# Patient Record
Sex: Female | Born: 1945 | Race: White | Hispanic: No | Smoking: Former smoker
Health system: Southern US, Community
[De-identification: ages and names within clinical notes are randomized; demographics above are authoritative.]

## PROBLEM LIST (undated history)

## (undated) DIAGNOSIS — N3281 Overactive bladder: Secondary | ICD-10-CM

## (undated) DIAGNOSIS — Z9011 Acquired absence of right breast and nipple: Secondary | ICD-10-CM

## (undated) DIAGNOSIS — I2510 Atherosclerotic heart disease of native coronary artery without angina pectoris: Secondary | ICD-10-CM

## (undated) DIAGNOSIS — M889 Osteitis deformans of unspecified bone: Secondary | ICD-10-CM

## (undated) DIAGNOSIS — I480 Paroxysmal atrial fibrillation: Secondary | ICD-10-CM

## (undated) DIAGNOSIS — E119 Type 2 diabetes mellitus without complications: Secondary | ICD-10-CM

## (undated) DIAGNOSIS — G2581 Restless legs syndrome: Secondary | ICD-10-CM

## (undated) DIAGNOSIS — E039 Hypothyroidism, unspecified: Secondary | ICD-10-CM

## (undated) DIAGNOSIS — K5790 Diverticulosis of intestine, part unspecified, without perforation or abscess without bleeding: Secondary | ICD-10-CM

## (undated) DIAGNOSIS — C801 Malignant (primary) neoplasm, unspecified: Secondary | ICD-10-CM

## (undated) HISTORY — PX: EYE SURGERY: SHX253

## (undated) HISTORY — DX: Overactive bladder: N32.81

## (undated) HISTORY — DX: Malignant (primary) neoplasm, unspecified: C80.1

## (undated) HISTORY — DX: Type 2 diabetes mellitus without complications: E11.9

## (undated) HISTORY — PX: CATARACT EXTRACTION, BILATERAL: SHX1313

## (undated) HISTORY — PX: MASTECTOMY: SHX3

## (undated) HISTORY — DX: Hypothyroidism, unspecified: E03.9

## (undated) HISTORY — PX: APPENDECTOMY: SHX54

## (undated) HISTORY — DX: Osteitis deformans of unspecified bone: M88.9

## (undated) HISTORY — PX: CATARACT EXTRACTION: SUR2

## (undated) HISTORY — DX: Restless legs syndrome: G25.81

---
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
## 1997-06-07 HISTORY — PX: ABDOMINAL HYSTERECTOMY: SHX81

## 2000-01-25 ENCOUNTER — Encounter: Payer: Self-pay | Admitting: General Surgery

## 2000-01-27 ENCOUNTER — Encounter: Payer: Self-pay | Admitting: General Surgery

## 2000-01-28 ENCOUNTER — Inpatient Hospital Stay (HOSPITAL_COMMUNITY): Admission: RE | Admit: 2000-01-28 | Discharge: 2000-01-30 | Payer: Self-pay | Admitting: General Surgery

## 2000-01-29 ENCOUNTER — Encounter: Payer: Self-pay | Admitting: Plastic Surgery

## 2000-02-22 ENCOUNTER — Encounter: Payer: Self-pay | Admitting: Plastic Surgery

## 2000-02-23 ENCOUNTER — Inpatient Hospital Stay (HOSPITAL_COMMUNITY): Admission: RE | Admit: 2000-02-23 | Discharge: 2000-02-26 | Payer: Self-pay | Admitting: Plastic Surgery

## 2000-02-24 ENCOUNTER — Encounter: Payer: Self-pay | Admitting: Plastic Surgery

## 2000-02-29 ENCOUNTER — Ambulatory Visit (HOSPITAL_COMMUNITY): Admission: RE | Admit: 2000-02-29 | Discharge: 2000-02-29 | Payer: Self-pay | Admitting: *Deleted

## 2000-02-29 ENCOUNTER — Encounter: Payer: Self-pay | Admitting: *Deleted

## 2000-03-07 ENCOUNTER — Ambulatory Visit (HOSPITAL_BASED_OUTPATIENT_CLINIC_OR_DEPARTMENT_OTHER): Admission: RE | Admit: 2000-03-07 | Discharge: 2000-03-07 | Payer: Self-pay | Admitting: General Surgery

## 2000-03-07 ENCOUNTER — Encounter: Payer: Self-pay | Admitting: General Surgery

## 2000-03-23 ENCOUNTER — Encounter: Payer: Self-pay | Admitting: *Deleted

## 2000-03-23 ENCOUNTER — Encounter: Admission: RE | Admit: 2000-03-23 | Discharge: 2000-03-23 | Payer: Self-pay | Admitting: *Deleted

## 2000-03-25 ENCOUNTER — Encounter: Payer: Self-pay | Admitting: *Deleted

## 2000-03-25 ENCOUNTER — Inpatient Hospital Stay (HOSPITAL_COMMUNITY): Admission: AD | Admit: 2000-03-25 | Discharge: 2000-03-28 | Payer: Self-pay | Admitting: *Deleted

## 2000-04-26 ENCOUNTER — Encounter: Admission: RE | Admit: 2000-04-26 | Discharge: 2000-04-26 | Payer: Self-pay | Admitting: *Deleted

## 2000-04-26 ENCOUNTER — Encounter: Payer: Self-pay | Admitting: *Deleted

## 2000-05-03 ENCOUNTER — Encounter: Admission: RE | Admit: 2000-05-03 | Discharge: 2000-05-03 | Payer: Self-pay | Admitting: Oncology

## 2000-05-03 ENCOUNTER — Encounter: Payer: Self-pay | Admitting: Oncology

## 2000-05-27 ENCOUNTER — Encounter: Payer: Self-pay | Admitting: Oncology

## 2000-05-27 ENCOUNTER — Inpatient Hospital Stay (HOSPITAL_COMMUNITY): Admission: EM | Admit: 2000-05-27 | Discharge: 2000-05-29 | Payer: Self-pay | Admitting: Emergency Medicine

## 2000-08-25 ENCOUNTER — Encounter: Admission: RE | Admit: 2000-08-25 | Discharge: 2000-11-23 | Payer: Self-pay | Admitting: *Deleted

## 2000-10-27 ENCOUNTER — Encounter: Payer: Self-pay | Admitting: *Deleted

## 2000-10-27 ENCOUNTER — Encounter: Admission: RE | Admit: 2000-10-27 | Discharge: 2000-10-27 | Payer: Self-pay | Admitting: *Deleted

## 2000-12-23 ENCOUNTER — Ambulatory Visit (HOSPITAL_BASED_OUTPATIENT_CLINIC_OR_DEPARTMENT_OTHER): Admission: RE | Admit: 2000-12-23 | Discharge: 2000-12-23 | Payer: Self-pay | Admitting: General Surgery

## 2001-02-13 ENCOUNTER — Encounter: Admission: RE | Admit: 2001-02-13 | Discharge: 2001-03-07 | Payer: Self-pay | Admitting: *Deleted

## 2001-05-23 ENCOUNTER — Encounter: Admission: RE | Admit: 2001-05-23 | Discharge: 2001-06-21 | Payer: Self-pay | Admitting: *Deleted

## 2002-06-22 ENCOUNTER — Encounter: Payer: Self-pay | Admitting: *Deleted

## 2002-06-22 ENCOUNTER — Ambulatory Visit (HOSPITAL_COMMUNITY): Admission: RE | Admit: 2002-06-22 | Discharge: 2002-06-22 | Payer: Self-pay | Admitting: *Deleted

## 2004-04-14 ENCOUNTER — Ambulatory Visit: Payer: Self-pay | Admitting: Family Medicine

## 2005-01-01 ENCOUNTER — Ambulatory Visit: Payer: Self-pay | Admitting: Family Medicine

## 2005-01-28 ENCOUNTER — Ambulatory Visit: Payer: Self-pay | Admitting: Family Medicine

## 2005-05-19 ENCOUNTER — Ambulatory Visit: Payer: Self-pay | Admitting: Family Medicine

## 2005-06-17 ENCOUNTER — Ambulatory Visit: Payer: Self-pay | Admitting: Orthopedic Surgery

## 2005-06-21 ENCOUNTER — Ambulatory Visit (HOSPITAL_COMMUNITY): Admission: RE | Admit: 2005-06-21 | Discharge: 2005-06-21 | Payer: Self-pay | Admitting: Orthopedic Surgery

## 2005-07-01 ENCOUNTER — Ambulatory Visit: Payer: Self-pay | Admitting: Orthopedic Surgery

## 2005-07-09 ENCOUNTER — Ambulatory Visit: Payer: Self-pay | Admitting: Family Medicine

## 2005-09-06 ENCOUNTER — Ambulatory Visit: Payer: Self-pay | Admitting: Family Medicine

## 2005-09-29 ENCOUNTER — Ambulatory Visit: Payer: Self-pay | Admitting: Orthopedic Surgery

## 2006-07-22 ENCOUNTER — Ambulatory Visit: Payer: Self-pay | Admitting: Family Medicine

## 2006-12-19 ENCOUNTER — Encounter (HOSPITAL_COMMUNITY): Admission: RE | Admit: 2006-12-19 | Discharge: 2007-01-18 | Payer: Self-pay | Admitting: Endocrinology

## 2008-02-09 ENCOUNTER — Encounter: Admission: RE | Admit: 2008-02-09 | Discharge: 2008-02-09 | Payer: Self-pay | Admitting: Family Medicine

## 2010-01-13 ENCOUNTER — Encounter: Admission: RE | Admit: 2010-01-13 | Discharge: 2010-01-13 | Payer: Self-pay | Admitting: Family Medicine

## 2010-10-23 NOTE — Op Note (Signed)
[place]. [facility]  Patient:    Desiree Bowers, Desiree Bowers                         MRN: [id number] Proc. Date: 01/27/00 Adm. Date:  [id number] Attending:  Glenna Fellows Tappan                           Operative Report  PREOPERATIVE DIAGNOSES:  1. Right breast cancer.  2. Acquired absence of right breast.  POSTOPERATIVE DIAGNOSES:  1. Right breast cancer.  2. Acquired absence of right breast.  OPERATION/PROCEDURE: Immediate breast reconstruction utilizing a left transverse rectus abdominis myocutaneous flap.  SURGEON: Alfredia Ferguson, M.D.  ASSISTANT: Dr. Dan Humphreys.  ANESTHESIA: General endotracheal anesthesia.  INDICATIONS FOR PROCEDURE: The patient is a 65 year old female with a recent diagnosis of right breast carcinoma.  She underwent right total mastectomy with sentinel node biopsy today and she wishes to proceed with immediate breast reconstruction.  The potential risks of surgery including partial or complete loss of the flap, asymmetry, bleeding, infection, hematoma, abdominal wall weakness, hernias or bulges, necrosis of the umbilicus due to vascular compromise, tightness of the abdomen, chronic discomfort in the abdomen, the need for secondary surgery, and overall dissatisfaction with results were discussed with the patient in the office.  In spite of these and other risks discussed with the patient she wishes to proceed with the operation.  DESCRIPTION OF PROCEDURE: Two days prior to surgery skin marks were placed outlining the dimensions of the skin paddle.  At the time of completion of her mastectomy today I was summoned to the operating room.  The mastectomy defect was first inspected and a 10 mm Blake drain was placed in the lateral axillary gutter.  A tunnel in the inferior/medial corner of the mastectomy defect was begun, dissecting on top of the upper portion of the rectus fascia in an inferior and oblique fashion, going toward the left.  I  opted to use the left rectus muscle because of a large scar overlying the right rectus muscle.  The mastectomy defect was copiously irrigated with saline irrigation and a moist sponge was placed in the defect.  A circular incision was now made around the umbilicus and the umbilicus dissected away from the TRAM skin paddle, leaving a healthy cuff of fat around the umbilical stalk to ensure vascular integrity. The upper portion of the skin paddle incision was now made and deepened through the subcutaneous tissue.  Using electrocautery an upper abdominal flap was elevated at the level of the anterior abdominal wall fascia up to the level of the costal margins bilaterally and the xiphoid in the midline.  I connected with the partially placed tunnel from the right mastectomy defect. The lower skin paddle incision was now made and deepened until reaching the anterior abdominal wall fascia.  The right corner of the skin tunnel was elevated off the anterior abdominal wall fascia until reaching approximately 1 cm to the left of midline.  The left corner of the skin paddle was elevated off the anterior abdomen wall fascia until visualizing the lateral level of the rectus perforators, which was approximately 3 cm medial to the lateral rectus border.  Two parallel incisions were made in the anterior rectus fascia beginning at the costal margin superiorly and separating the two incisions by approximately 3 cm.  The two incisions were carried inferiorly through the anterior rectus fascia.  The lateral of  the two incisions was carried inferiorly starting along the lateral connection of the skin paddle to the anterior rectus fascia.  On reaching the inferior connection the incision of the rectus fascia was curved along the inferior connection of the skin paddle connection.  The median incision was carried inferiorly starting along the medial skin paddle connection to the anterior rectus fascia until  connecting to the previously placed rectus incision inferiorly.  The rectus muscle was now dissected out of its anatomic bed, elevating the rectus fascia off the rectus muscle with the exception of the strip overlying the middle of the muscle.  The rectus muscle was then dissected off its posterior rectus fascia, completely freeing it with the exception of its superior insertion in the inferior origin and its deep inferior epigastric vessels.  The deep inferior epigastric vessels were visualized.  The rectus muscle was now divided at the arcuate line.  The deep inferior epigastric artery was clipped between hemoclips and then divided.  The two veins were also clipped and divided. Zone 4 (right lateral one-third of the skin paddle) was excised.  The left corner of the skin paddle was also removed, removing approximately 5 cm.  The skin paddle along with its connecting muscle was tunneled up to the recipient site and temporarily stapled into position.  The abdominal wound was copiously irrigated with saline irrigation and inspected for hemostasis.  Once hemostasis had been assured the anterior rectus fascia was closed using multiple interrupted figure-of-eight buried 0 Prolene sutures.  Marlex onlay graft was then placed over the rectus closure approximately 8 cm in width and approximately 16 cm in height.  This mesh was fixed in position using a running 2-0 Prolene suture along the periphery of the mesh.  An opening was made in the central portion of the mesh and the umbilicus was brought through this opening.  The abdominal wound was again irrigated with saline irrigation and two Blake drains were placed and brought out throughout separate stab incisions inferiorly.  The patient was placed in semi-Fowler position.  The abdominal wound was closed by approximating the dermis of the abdominal incision using multiple interrupted 2-0 Vicryl sutures.  Prior to complete closure a new opening for  the umbilicus was made and the umbilicus was brought through this new opening and fixed into position using interrupted 3-0 Vicryl sutures.  On completion of the closure of the abdominal wound the abdominal  skin was cleansed, dried, and Steri-Strips placed on the skin incision. Attention was directed to the skin paddle of the flap.  There was noted to be a moderate amount of venous congestion.  The flap was elevated and venous drainage was allowed to occur, and the bluish color of the flap disappeared immediately.  I opted to remove a bit more of the left and right ends of the skin paddle.  There was mixed bleeding with some dark blood and some bright red blood.  Once I trimmed adequately the flap was placed in desired position with superior breast flap overlapping the skin paddle somewhat.  The skin paddle which was beneath the superior breast flap was de-epithelialized.  The upper cut edge of the skin paddle was then fixed to the superior limits of the mastectomy dissection using multiple interrupted 3-0 Vicryl sutures.  The superior breast flap was then closed to the cut edge of the skin of the upper portion of the skin paddle using a running 3-0 subcuticular Vicryl suture. The inferior breast flap was affixed to the  inferior cut edge of the skin paddle using multiple interrupted 3-0 Vicryl sutures in the dermis.  The area was cleansed and Steri-Strips were applied.  The vascularity appeared to be better with good capillary refill.  Dressings were applied.  The patient was awakened, extubated, and transported to the recovery room in satisfactory condition.  Estimated blood loss was approximately 150 cc. DD:  01/27/00 TD:  01/28/00 Job: [id number]

## 2010-10-23 NOTE — Op Note (Signed)
[place]. [facility]  Patient:    Desiree Bowers, Desiree Bowers                         MRN: [id number] Proc. Date: 03/07/00 Adm. Date:  [id number] Attending:  Delsa Bern CC:         Lorne Skeens. Hoxworth, M.D.   Operative Report  PREOPERATIVE DIAGNOSES: 1. Complex open wound periumbilical area, secondary to fat necrosis from    previous TRAM flap. 2. Complex open wound right TRAM flap, status post right breast reconstruction    with complications of fat necrosis.  POSTOPERATIVE DIAGNOSES: 1. Complex open wound periumbilical area, secondary to fat necrosis from    previous TRAM flap. 2. Complex open wound right TRAM flap, status post right breast reconstruction    with complications of fat necrosis.  PROCEDURE: 1. Debridement of open wound right TRAM. 2. Debridement open wound periumbilical area.  SURGEON:  Alfredia Ferguson, M.D.  ANESTHESIA:  General endotracheal anesthesia  INDICATIONS FOR SURGERY:  This is a 65 year old woman who underwent right mastectomy with TRAM flap reconstruction.  Postoperative course was complicated by venous congestion and loss of her native breast flaps inferiorly and medially and loss of a portion of the medial aspect of the TRAM flap because of fat necrosis.  In addition the patient suffered significant fatty necrosis in the periumbilical area along with necrosis of the skin.  The patient has undergone previous debridement of these areas.  She returns at this time for further debridement.  The patient understands that the entire healing process will be slow especially if she begins chemotherapy next week.  In spite of that she understands the need for debridement and wishes to proceed.  DESCRIPTION OF SURGERY:  After adequate general endotracheal anesthesia had been induced the patients chest and abdomen were prepped and draped in a sterile fashion.  Attention was first directed to the right reconstructive breast.   There was only a small amount of residual fat necrosis still present. This was sharply debrided.  The remainder of the open wound which is approximately 10 cm x 4-5 cm in width has granulated nicely.  Attention was directed to the periumbilical wound.  There was still a moderate amount of fat necrosis in the area.  This was sharply debridement until reaching punctate bleeding.  As I was debriding superiorly I got into a seroma pocket which contained probably about 50-60 cc of clear seroma fluid.  The patient was also noted to have some erythema over the lateral limits of her abdominal incision.  I dissected subcutaneously through the periumbilical wound until connecting again to a fluid collection.  It did not appear to be purulent.  This pocket was copiously irrigated out with Ringers lactate.  Following sharp debridement the pocket was irrigated with 3 liters of Ringers lactate using the pulse lavage irrigator.  Hemostasis was accomplished using electrocautery.  The wound was packed loosely with saline moistened gauze.  The patient was then dressed and turned over to Dr. Johna Sheriff who is placing a Port-A-Cath. DD:  03/07/00 TD:  03/07/00 Job: [id number]

## 2010-10-23 NOTE — Discharge Summary (Signed)
[place]. [facility]  Patient:    Desiree Bowers, Desiree Bowers                         MRN: [id number] Adm. Date:  [id number] Disc. Date: [id number] Attending:  Delsa Bern CC:         Lorne Skeens. Hoxworth, M.D.                           Discharge Summary  ADMISSION DIAGNOSIS:  Right breast carcinoma.  DISCHARGE DIAGNOSIS:  Right breast carcinoma.  OPERATION PERFORMED:  Right total mastectomy with sentinel node biopsy and immediate reconstruction utilizing left transverse rectus abdominis myocutaneous flap, all performed on January 27, 2000.  HISTORY OF PRESENT ILLNESS:  This is a 65 year old woman who saw her gynecologist in March of this year, noting some ulcerative changes of her right nipple.  The gynecologist recommended that she see a surgeon in evaluation.  However, the patient failed to follow up for several months.  She underwent a routine mammogram on July 3, which revealed suspicious retroareolar area.  The patient underwent a core needle biopsy at [facility] which returned with evidence of malignancy.  The patient was referred to Dr. Johna Sheriff for a second opinion.  The patient was counseled based on her biopsy report that mastectomy would be optimal treatment.  The patient wishes to undergo total mastectomy, sentinel node biopsy, and immediate reconstruction with TRAM flap.  PAST MEDICAL HISTORY:  Hypertension, gastroesophageal reflux, and obesity. The patient denies history of diabetes.  PAST SURGICAL HISTORY:  Abdominal history through a low transverse incision and an appendectomy.  She had four vaginal deliveries.  FAMILY HISTORY:  Mother had breast cancer.  Mother died from metastatic breast cancer.  There is also a family history of diabetes.  SOCIAL HISTORY:  The patient is married with four children that are grown. She denies alcohol or tobacco use.  She is presently unemployed.  REVIEW OF SYSTEMS:  The patient denies any  history of epilepsy, headaches, or dizziness.  She denies shortness of breath, dyspnea on exertion, or angina. There is no history of heart attacks.  She does admit to gastroesophageal reflux.  She denies blood in her urine, blood in her stool, or chronic constipation or diarrhea.  The patient has no history of hepatitis.  MEDICATIONS:  Include Synthroid, Prilosec, Premarin, hydrochlorothiazide, alprazolam, and Darvocet.  PHYSICAL EXAMINATION:  Please see admission H&P for complete physical exam.  ADMISSION LABORATORY VALUES:  Included an urinalysis which was negative.  The patient also had a CBC which showed a hemoglobin of 12.7, hematocrit 37.6, and a white count of 8100.  Her electrolytes were completely normal.  Chest x-ray was normal and cardiogram was normal.  HOSPITAL COURSE:  On the day of admission, the patient underwent a right total mastectomy and a sentinel node biopsy.  Sentinel node biopsy was negative.  Following mastectomy, the patient underwent an immediate reconstruction using a left contralateral transverse rectus abdominis myocutaneous flap.  The surgery went well.  Postoperatively, the patients course was complicated by elevated temperatures in the 102 plus range for the first 24 hours.  She was treated with vigorous pulmonary breathing exercises.  She had a chest x-ray on the second postoperative day which showed either perihilar atelectasis or pneumonia.  My feeling is that it likely represents atelectasis.  She began to defervesce on the second postoperative day and has remained essentially afebrile  most of the time with the exception of the elevation of temperatures in the high 100s on one occasion.  The patients TRAM flap had some blue discoloration in the medial one-third. It hopefully represents epidermal lysis and not underlying necrosis of the fatty tissue.  Nothing needs to be done at this point and this will simply be observed as an outpatient.  The  abdominal wound seems to be healing fairly well.  She again had some blue discoloration in the periumbilical area.  The rest of the wound of the abdomen looks good.  Her drainage has been fairly significant in the greater than 100 cc per drain per 24 hours.  Her drains were occluded the first 24 hours, and this may speak to why they have been so high the second and third day.  The patient did desire to be discharged from the hospital on the third postoperative day.  She is tolerating a regular diet, ambulating without difficulty, and her pain is under control with oral medication.  I will follow her up four days after discharge.  DISCHARGE INSTRUCTIONS:  Include to do no heavy lifting, no driving, or no strenuous activity.  She may wear her bra for several hours a day beginning today.  DISCHARGE MEDICATIONS:  Include Tylox one to two q.4h. p.r.n. pain and Keflex 500 mg q.i.d. for five days.  DISPOSITION:  She has been counseled regarding emptying her Blake drains twice a day and recording the output.  She understands postoperative care.  She wishes to proceed with discharge. DD:  01/30/00 TD:  02/01/00 Job: [id number]

## 2010-10-23 NOTE — Op Note (Signed)
[place]. [facility]  Patient:    Desiree Bowers, Desiree Bowers                         MRN: [id number] Proc. Date: 03/07/00 Adm. 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  POSTOPERATIVE DIAGNOSIS:  Carcinoma of the breast with poor venous access.  OPERATION PERFORMED:  Placement of Life-Port via the left internal jugular vein.  SURGEON:  Lorne Skeens. Hoxworth, M.D.  ANESTHESIA:  General.  INDICATIONS FOR PROCEDURE:  [name] is a 65 year old white female with a recent diagnosis of breast cancer.  She has poor venous access and will require long term access for IV chemotherapy.  Placement of a subcutaneous venous port has been recommended and accepted.  The nature of the procedure, its indications and risks of bleeding, infection, thrombosis, pneumothorax were discussed and understood preoperatively.  She is now brought to the operating room for this procedure.  DESCRIPTION OF PROCEDURE:  Following completion of Dr. Audelia Hives procedure under general anesthesia, the left neck, chest and shoulder were sterilely prepped with Betadine and draped.  She had received broad spectrum antibiotics preoperatively.  Initially, I tried to cannulate the left subclavian vein but could not cannulate through this vessel.  Following this, the left internal jugular vein was cannulated with a needle and guide wire and confirmed by fluoroscopy.  Following this, the introducer was passed over the guide wire and the flushed catheter placed via the introducer which was then stripped away and the tip of the catheter positioned in the superior vena cava by fluoroscopy.  Following this, the site on the anterior chest wall was chosen. A transverse incision was made and a subcutaneous pocket created.  The catheter was tunneled subcutaneously into the pocket, trimmed  to length and attached to the port which was sutured to the anterior chest wall with interrupted 2-0 Prolene.  The incisions were then closed with interrupted subcutaneous 4-0 Monocryl, running subcuticular 4-0 Monocryl and Steri-Strips. The soft tissue had been infiltrated with Marcaine.  The catheter flushed and aspirated easily and was left flushed with concentrated heparin solution. Sponge, needle and instrument counts were correct.  Dry sterile dressings were applied.  The patient was transferred to the recovery room in good condition. DD:  03/07/00 TD:  03/08/00 Job: [id number]

## 2010-10-23 NOTE — Op Note (Signed)
[place]. [facility]  Patient:    Desiree Bowers, Desiree Bowers                         MRN: [id number] Proc. Date: 01/27/00 Adm. Date:  [id number] Attending:  Glenna Fellows Tappan                           Operative Report  PREOPERATIVE DIAGNOSIS:  Carcinoma of the right breast.  POSTOPERATIVE DIAGNOSIS:  Carcinoma of the right breast.  OPERATION PERFORMED: 1. Blue dye injection. 2. Right axillary sentinel lymph node biopsy. 3. Right total mastectomy.  SURGEON:  Lorne Skeens. Hoxworth, M.D.  ANESTHESIA:  General.  INDICATIONS FOR PROCEDURE:  Desiree Bowers is a 65 year old white female who presents with crusting and bleeding from the right nipple.  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  After discussion of treatment options, she has elected to proceed with a right axillary sentinel lymph node biopsy, possible right axillary dissection and right mastectomy to be followed by immediate TRAM flap reconstruction.  The nature of the procedure, its indications and risks of bleeding and infection were discussed and understood preoperatively.  DESCRIPTION OF PROCEDURE:  The patient was brought to the operating room and placed in supine position on the operating table and general endotracheal anesthesia was induced.  The entire chest and abdomen were sterilely prepped and draped.  PAS were placed.  She received broad spectrum antibiotics preoperatively.  She had undergone periareolar injection of technetium sulfur colloid in nuclear medicine one hour previously.  10 cc of Lymphazurin blue was injected in the periareolar subcutaneous and intradermal space and massaged for five minutes.  The Neoprobe was used to localize a definite hot spot in the right axilla.  A small incision was made over this and dissection carried down through the  subcutaneous tissues with cautery.  The axilla was bluntly entered.  A blue lymphatic was seen coursing down to the axilla, was traced down to a bright blue lymph node that was very hot.  This was excised with cautery and clips.  Ex vivo the node had counts of approximately 2900. There was background in the axilla of about 10 to 15 and one further hot area of about 500.  I carried dissection down onto this hot area and there were two or three smaller nodes that did not contain any dye that were mildly hot compared to the original sentinel node.  These were also similarly excised and the first was sent for Touch Prep and two others sent for permanent section. Following removal of these there were no counts in the axilla higher than 10 to 15.  The sentinel node Touch Preps returned with negative for cancer.  An elliptical transverse incision was made encompassing the nipple areolar complex of the right breast.  Skin and subcutaneous flaps were then raised to the periphery of the breast superiorly, medially, inferiorly and laterally out to the latissimus dorsi.  The breast was then reflected up off the pectoralis fascia and the cautery.  The breast was freed from the serratus and latissimus laterall.  The breast was dissected free down to tail of Spence and junction of the axilla at which point, axillary contents were divided across Benbow clamps and tied with 3-0 Vicryl and the specimen removed.  Complete  hemostasis was assured.  Dr. Benna Dunks then proceeded with TRAM flap reconstruction. DD:  01/27/00 TD:  01/28/00 Job: [id number]

## 2010-10-23 NOTE — Op Note (Signed)
[place]. [facility]  Patient:    Desiree Bowers, Desiree Bowers                         MRN: [id number] Proc. Date: 02/22/00 Adm. Date:  [id number] Attending:  Loura Halt Ii                           Operative Report  PREOPERATIVE DIAGNOSIS:  1. Necrosis of medial 20% of right transverse rectus abdominis myocutaneous    flap. 2. Necrosis of medial and inferior breast flaps on mastectomy side, right. 3. Necrosis of periumbilical skin.  POSTOPERATIVE DIAGNOSIS: 1. Necrosis of medial 20% of right transverse rectus abdominis myocutaneous    flap. 2. Necrosis of medial and inferior breast flaps on mastectomy side, right. 3. Necrosis of periumbilical skin.  OPERATION PERFORMED: 1. Debridement of necrotic periumbilical skin and placement of saline    wet-to-dry dressings. 2. Debridement of medial transverse rectus abdominis myocutaneous flap and    breast flap skin and placement of VAC sponge.  SURGEON:  Alfredia Ferguson, M.D.  ANESTHESIA:  General endotracheal.  INDICATIONS FOR PROCEDURE:  The patient is a 65 year old woman who is approximately a month after right-sided TRAM flap.  She had venous congestion of the flap immediately postoperatively.  It seemed to clear and then about a week after surgery, she began to develop what appeared to be epidermolysis of the skin.  I have allowed the wound to demarcate.  She now has a very dark, hard eschar over the medial aspect of her right-sided TRAM flap.  In addition, she developed necrosis of the medial and inferior breast flaps from the mastectomy.  Furthermore, the patient had an approximately 5 x 5 cm skin necrosis in the periumbilical region.  Interestingly, the umbilicus is perfectly fine.  Plan is to debride these areas.  DESCRIPTION OF PROCEDURE:  After adequate general endotracheal anesthesia had been induced, the patients chest and abdomen was draped in sterile fashion. Using sharp dissection, the  periumbilical skin was debrided down to the fatty tissue.  The wound was irrigated.  There was minimal bleeding coming from fat so it likely the patient has significant fatty necrosis.  I made no attempt to debride the fatty necrosis in order to allow the area to demarcate further. Attention was now directed to the right-sided TRAM flap. Using tangential excision of the eschar, the entire eschar was removed.  There was some fatty tissue that was viable.  There was also some that was questionable.  The questionable fatty tissue was left behind to allow it to dermarcate.  After removal of the entire eschar, the wound was copiously irrigated with saline irrigation.  I opted to place a VAC sponge.  The VAC was placed without difficulty.  The VAC was turned on a suction of 125 torr.  The patient tolerated the procedure well with minimal blood loss.  She was awakened extubated and transported to the recovery room in satisfactory condition. DD:  02/22/00 TD:  02/23/00 Job: [id number]

## 2010-10-23 NOTE — Discharge Summary (Signed)
[place]. [facility]  Patient:    Desiree Bowers, Desiree Bowers                         MRN: [id number] Adm. Date:  [id number] Disc. Date: [id number] Attending:  Loura Halt Ii                           Discharge Summary  ADMISSION DIAGNOSES: 1. Partial necrosis of transverse rectus abdominis myocutaneous flap right    chest. 2. Partial necrosis of periumbilical skin and subcutaneous tissue related to    previous transverse rectus abdominis myocutaneous flap. 3. History of breast cancer. 4. Hypertension. 5. Hypothyroidism. 6. Gastroesophageal reflux.  DISCHARGE DIAGNOSES: 1. Partial necrosis of transverse rectus abdominis myocutaneous flap right    chest. 2. Partial necrosis of periumbilical skin and subcutaneous tissue related to    previous transverse rectus abdominis myocutaneous flap. 3. History of breast cancer. 4. Hypertension. 5. Hypothyroidism. 6. Gastroesophageal reflux.  OPERATIONS:  Debridement of right transverse rectus abdominis myocutaneous flap and periumbilical tissue performed on February 22, 2000.  HISTORY OF PRESENT ILLNESS:  This is a 65 year old woman who is status post right mastectomy for breast cancer reconstructed with a TRAM flap. Postoperatively, the patient experienced necrosis of the middle one-fourth to one-third of the TRAM flap.  She also experienced necrosis of her native breast flaps medially and inferomedially.  The patient also experienced necrosis of the periumbilical skin and subcutaneous tissue.  She has been managed as an outpatient up to this point, but was admitted on February 22, 2000, for debridement.  PAST MEDICAL HISTORY: 1. Mild anemia, probably secondary to chronic disease. 2. Mild hypothyroidism. 3. Hypertension.  PAST SURGICAL HISTORY:  Right mastectomy for breast cancer.  MEDICATIONS: 1. Synthroid 0.05 mg daily. 2. Protonix 40 mg daily. 3. Avapro 300 mg daily. 4. Hydrochlorothiazide 12.5 mg  daily. 5. Vicodin 1-2 every four hours as needed for pain. 6. Restoril 30 mg at night as needed for sleep.  ADMISSION LABORATORY DATA:  CBC, which showed a hemoglobin of 11.0, hematocrit 33.2.  She had an elevated glucose of 117.  Her SGOT and SGPT are slightly elevated at 43 and 45 respectively.  Potassium slightly elevated at 5.3.  All the rest of the electrolytes were normal.  HOSPITAL COURSE:  On the day of admission the patient was taken to the operating room, where she underwent debridement of the medial aspect of her TRAM flap and also debridement of her native breast flaps medially and inferiorly.  The patient also underwent debridement of the periumbilical skin and fat.  I placed a Vac on the breast wound, and we did saline wet-to-dry dressings.  The patients insurance company denied obtainment of the Vac as an outpatient.  This is unfortunate, as this would certainly have sped her recovery and healing.  I am discharging her at this time for instructions for twice-daily dressing changes using saline wet-to-dry in both abdominal and breast woundd.  I anticipate that this woun will require further debridement of the fat necrosis which is present.  I did do some bedside debridement on the day she was being discharged.  The right breast wound looks much better. I do see some beginning granulation tissue.  The abdominal wound shows no evidence of granulation tissue and does have some residual fat necrosis which will require debridement.  DISCHARGE MEDICATIONS:  Include all medications the patient was taking when she  came in the hospital.  FOLLOW-UP:  Will be provided in six days in my office.  The patient understands the need for further debridement.  She wishes to go home at this time. DD:  02/26/00 TD:  02/28/00 Job: [id number]

## 2010-10-23 NOTE — Discharge Summary (Signed)
[place]. [facility]  Patient:    Desiree Bowers, Desiree Bowers                         MRN: [id number] Adm. Date:  [id number] Disc. Date: [id number] Attending:  Aliene Altes                           Discharge Summary  REASON FOR ADMISSION:  The patient was admitted on October 19 for an episode of febrile neutropenia.  HOSPITAL COURSE:  Cultures remained negative. She was recovered nicely with Neupogen and Primaxin IV. She had no specific complaints. She was transfused two units of blood on October 22 and discharged home thereafter in good condition on p.o. Levaquin.  FOLLOWUP:  The patient will followup with me in the office later this week.  DISCHARGE MEDICATIONS: 1. Vicodin. 2. Levaquin. 3. She will come into the office on Friday for her weekly Epogen on Friday. DD:  03/28/00 TD:  03/29/00 Job: [id number]

## 2010-10-23 NOTE — H&P (Signed)
. [facility]  Patient:    Desiree Bowers, Desiree Bowers                         MRN: [id number] Adm. Date:  [id number] Attending:  Aliene Altes                         History and Physical  CHIEF COMPLAINT: This patient is a 65 year old female with stage 2 breast cancer, three positive nodes, ER/PR negative, Her-2 3+, status post first cycle of Adriamycin and Cytoxan on March 14, 2000, now with presentation of febrile neutropenia.  HISTORY OF PRESENT ILLNESS: It should be noted that she had blood drawn on March 21, 2000 and her ANC at that point was 800, and nobody called an M.D. No specific complaints today.  No cough.  No urinary symptoms.  No catheter site changes.  PAST MEDICAL HISTORY:  1. Padgetts bone disease.  2. Hypothyroidism.  3. Hypertension.  SOCIAL HISTORY: No tobacco.  FAMILY HISTORY: Positive for breast, ovarian, and colon cancer.  MEDICATIONS:  1. Atacand.  2. Synthroid.  ALLERGIES: PENICILLIN.  PHYSICAL EXAMINATION:  VITAL SIGNS: Temperature 101.5 degrees, blood pressure 129/74, heart rate 131, respirations 20.  HEENT: Port-A-Cath looks good.  Oropharynx clear.  CHEST: Clear.  HEART: Regular rate and rhythm.  ABDOMEN: Positive bowel sounds, soft.  EXTREMITIES: Good range of motion.  No edema.  NEUROLOGIC: Alert and oriented x 3.  Cranial nerves 2-12 grossly intact. Motor and sensation intact globally.  IMPRESSION: Stage 2 breast cancer, status post Adriamycin and Cytoxan x 1, with febrile neutropenia.  PLAN: We are going to draw cultures and get a chest x-ray, start Primaxin and IV fluids, and Neupogen. DD:  03/25/00 TD:  03/26/00 Job: [id number]

## 2010-10-23 NOTE — H&P (Signed)
[place]. [facility]  Patient:    Desiree Bowers, Desiree Bowers                       MRN: [id number] Adm. Date:  [id number] Attending:  Aliene Altes CC:         Aliene Altes, M.D.   History and Physical  CHIEF COMPLAINT:  Desiree Bowers is a 65 year old with a history of stage II right-sided breast cancer.  She is now admitted with a fever in the setting of neutropenia, following AC chemotherapy.  HISTORY OF PRESENT ILLNESS:  Desiree Bowers has a history of stage II right-sided breast cancer.  She was diagnosed with node-positive disease after undergoing a right-sided mastectomy with an immediate TRAM reconstruction earlier this year.  She is followed by Dr. Aliene Altes, and she has been treated with adjuvant [facility] chemotherapy.  She was admitted with febrile neutropenia following cycle number one of AC.  She completed a fourth cycle of AC chemotherapy on May 17, 2000.  She says that she was well until last p.m. when she developed a fever of 101.3 degrees.  She had no associated symptoms.  She presented to the [facility] Emergency Room for further evaluation, and she was noted to have a temperature of 102.2 degrees.  The initial white count returned at 1.2, with approximately 0.25 neutrophils, and she is now admitted for further evaluation and intravenous antibiotic support.   PAST MEDICAL HISTORY: 1. History of breast cancer as above. 2. History of Pagets disease of the bone. 3. Hypothyroidism. 4. Hypertension.  CURRENT MEDICATIONS: 1. Synthroid 0.5 mg q.d. 2. Compazine p.r.n. 3. Prilosec 20 mg q.d. 4. Coumadin 1 mg q.d.  ALLERGIES:  PENICILLIN.  FAMILY HISTORY:  Not obtained.  SOCIAL HISTORY:  Noncontributory.  REVIEW OF SYSTEMS:  She denies diarrhea, nausea/vomiting, dysuria, cough, pain, and she has not noted any change in the abdominal or right chest TRAM surgical sites.  She is receiving daily wet to dry dressing changes via a home care nurse,  to the slowly healing surgical wounds from the TRAM surgery.  PHYSICAL EXAMINATION:  VITAL SIGNS:  On admission to the emergency room temperature 102.2 degrees, pulse 129, blood pressure 148/84, respirations 18, oxygen saturation 98% on room air.  HEENT:  Sclerae anicteric.  Oropharynx without thrush or ulcerations.  NECK:  Without palpable mass.  LUNGS:  Clear bilaterally.  No distress.  CARDIAC:  A regular rate and rhythm, no gallop.  CHEST:  Left upper chest Port-A-Cath site without erythema or tenderness. Status post right TRAM reconstruction.  There is an area of granulation tissue over the lower inner portion of the TRAM tissue.  There is no surrounding erythema.  No drainage.  ABDOMEN:  Soft, nontender.  No organomegaly, no mass.  Healing TRAM surgical site with a wet to dry gauze dressing in place.  I cannot express any drainage.  There is no surrounding erythema.  NODES:  No palpable lymph nodes.  EXTREMITIES:  No edema.  SKIN:  No rash.  LABORATORY DATA:  Hemoglobin 9.4, white count 1.2, platelets 234,000. Prelim8inary neutrophils 20%.  IMPRESSION/RECOMMENDATIONS:  Desiree Bowers is a 65 year old with a history of stage II right-sided breast cancer.  She is now 10 days out from a fourth course of [facility] chemotherapy, and she is admitted with a fever, in the setting of neutropenia.  There is no apparent source for infection on a review of her history and physical examination this morning.  We will obtain a culture from the Port-A-Cath site, and a culture of the urine.  She will be started on broad-spectrum intravenous antibiotic support. I will continue her on Neupogen.  (She received Neupogen for five days following this cycle of chemotherapy.)  We will continue wet to dry dressing changes at the TRAM sites.  These areas do not appear infected.  She appears clinically stable, and I am hopeful that we will be able to discharge her over the next few days. DD:   05/27/00 TD:  05/27/00 Job: [id number]

## 2011-03-19 ENCOUNTER — Other Ambulatory Visit: Payer: Self-pay | Admitting: Family Medicine

## 2011-03-19 DIAGNOSIS — Z853 Personal history of malignant neoplasm of breast: Secondary | ICD-10-CM

## 2011-03-19 DIAGNOSIS — N644 Mastodynia: Secondary | ICD-10-CM

## 2011-04-05 ENCOUNTER — Ambulatory Visit
Admission: RE | Admit: 2011-04-05 | Discharge: 2011-04-05 | Disposition: A | Discharge status: Home / Self Care | Payer: Medicare Other | Source: Ambulatory Visit | Attending: Family Medicine | Admitting: Family Medicine

## 2011-04-05 DIAGNOSIS — N644 Mastodynia: Secondary | ICD-10-CM

## 2011-04-05 DIAGNOSIS — Z853 Personal history of malignant neoplasm of breast: Secondary | ICD-10-CM

## 2011-11-16 ENCOUNTER — Other Ambulatory Visit: Payer: Self-pay | Admitting: *Deleted

## 2011-11-16 NOTE — Telephone Encounter (Signed)
Call requesting mastectomy bra and breast prosthesis. Patient has not been seen in this practice since 2005. Made pharmacy aware we will not be able to meet this request. Best to follow up with her PCP.

## 2012-06-09 ENCOUNTER — Other Ambulatory Visit: Payer: Self-pay | Admitting: Family Medicine

## 2012-06-09 DIAGNOSIS — Z1231 Encounter for screening mammogram for malignant neoplasm of breast: Secondary | ICD-10-CM

## 2012-06-09 DIAGNOSIS — Z9011 Acquired absence of right breast and nipple: Secondary | ICD-10-CM

## 2012-06-23 ENCOUNTER — Ambulatory Visit
Admission: RE | Admit: 2012-06-23 | Discharge: 2012-06-23 | Disposition: A | Discharge status: Home / Self Care | Payer: Medicare Other | Source: Ambulatory Visit | Attending: Family Medicine | Admitting: Family Medicine

## 2012-06-23 DIAGNOSIS — Z9011 Acquired absence of right breast and nipple: Secondary | ICD-10-CM

## 2012-06-23 DIAGNOSIS — Z1231 Encounter for screening mammogram for malignant neoplasm of breast: Secondary | ICD-10-CM

## 2012-12-01 ENCOUNTER — Other Ambulatory Visit: Payer: Self-pay | Admitting: Gastroenterology

## 2012-12-01 DIAGNOSIS — Z1211 Encounter for screening for malignant neoplasm of colon: Secondary | ICD-10-CM

## 2012-12-01 DIAGNOSIS — K562 Volvulus: Secondary | ICD-10-CM

## 2012-12-18 ENCOUNTER — Other Ambulatory Visit: Payer: Medicare Other

## 2013-01-08 ENCOUNTER — Ambulatory Visit
Admission: RE | Admit: 2013-01-08 | Discharge: 2013-01-08 | Disposition: A | Discharge status: Home / Self Care | Payer: Medicare Other | Source: Ambulatory Visit | Attending: Gastroenterology | Admitting: Gastroenterology

## 2013-01-08 DIAGNOSIS — K562 Volvulus: Secondary | ICD-10-CM

## 2013-02-13 ENCOUNTER — Ambulatory Visit: Payer: Medicare Other | Attending: Internal Medicine | Admitting: Physical Therapy

## 2013-02-13 DIAGNOSIS — R42 Dizziness and giddiness: Secondary | ICD-10-CM | POA: Insufficient documentation

## 2013-02-13 DIAGNOSIS — IMO0001 Reserved for inherently not codable concepts without codable children: Secondary | ICD-10-CM | POA: Insufficient documentation

## 2013-02-13 DIAGNOSIS — Z9181 History of falling: Secondary | ICD-10-CM | POA: Insufficient documentation

## 2013-02-21 ENCOUNTER — Ambulatory Visit: Payer: Medicare Other | Admitting: Physical Therapy

## 2013-02-28 ENCOUNTER — Ambulatory Visit: Payer: Medicare Other | Admitting: Physical Therapy

## 2013-03-06 ENCOUNTER — Ambulatory Visit: Payer: Medicare Other

## 2013-09-20 ENCOUNTER — Other Ambulatory Visit (HOSPITAL_COMMUNITY): Payer: Self-pay | Admitting: Family Medicine

## 2013-09-20 DIAGNOSIS — Z1231 Encounter for screening mammogram for malignant neoplasm of breast: Secondary | ICD-10-CM

## 2013-09-24 DIAGNOSIS — I10 Essential (primary) hypertension: Secondary | ICD-10-CM | POA: Insufficient documentation

## 2013-09-25 ENCOUNTER — Ambulatory Visit (HOSPITAL_COMMUNITY): Payer: Medicare Other

## 2013-09-26 ENCOUNTER — Other Ambulatory Visit (HOSPITAL_COMMUNITY): Payer: Self-pay | Admitting: Family Medicine

## 2013-09-26 ENCOUNTER — Ambulatory Visit
Admission: RE | Admit: 2013-09-26 | Discharge: 2013-09-26 | Disposition: A | Discharge status: Home / Self Care | Payer: Medicare Other | Source: Ambulatory Visit | Attending: Family Medicine | Admitting: Family Medicine

## 2013-09-26 DIAGNOSIS — Z1231 Encounter for screening mammogram for malignant neoplasm of breast: Secondary | ICD-10-CM

## 2013-12-18 DIAGNOSIS — M889 Osteitis deformans of unspecified bone: Secondary | ICD-10-CM | POA: Insufficient documentation

## 2014-01-18 DIAGNOSIS — R768 Other specified abnormal immunological findings in serum: Secondary | ICD-10-CM | POA: Insufficient documentation

## 2014-01-28 ENCOUNTER — Other Ambulatory Visit: Payer: Self-pay | Admitting: Internal Medicine

## 2014-01-28 DIAGNOSIS — E041 Nontoxic single thyroid nodule: Secondary | ICD-10-CM

## 2014-01-28 DIAGNOSIS — E063 Autoimmune thyroiditis: Secondary | ICD-10-CM

## 2014-01-30 ENCOUNTER — Ambulatory Visit
Admission: RE | Admit: 2014-01-30 | Discharge: 2014-01-30 | Disposition: A | Discharge status: Home / Self Care | Payer: Medicare Other | Source: Ambulatory Visit | Attending: Internal Medicine | Admitting: Internal Medicine

## 2014-01-30 DIAGNOSIS — E063 Autoimmune thyroiditis: Secondary | ICD-10-CM

## 2014-01-30 DIAGNOSIS — E041 Nontoxic single thyroid nodule: Secondary | ICD-10-CM

## 2014-04-04 DIAGNOSIS — K219 Gastro-esophageal reflux disease without esophagitis: Secondary | ICD-10-CM | POA: Insufficient documentation

## 2014-04-04 DIAGNOSIS — F411 Generalized anxiety disorder: Secondary | ICD-10-CM | POA: Insufficient documentation

## 2014-06-19 DIAGNOSIS — I480 Paroxysmal atrial fibrillation: Secondary | ICD-10-CM

## 2014-06-19 DIAGNOSIS — I209 Angina pectoris, unspecified: Secondary | ICD-10-CM | POA: Insufficient documentation

## 2014-06-19 HISTORY — DX: Paroxysmal atrial fibrillation: I48.0

## 2014-06-20 ENCOUNTER — Other Ambulatory Visit: Payer: Self-pay

## 2014-06-20 DIAGNOSIS — I83813 Varicose veins of bilateral lower extremities with pain: Secondary | ICD-10-CM

## 2014-08-07 ENCOUNTER — Encounter: Payer: Medicare Other | Admitting: Vascular Surgery

## 2014-08-07 ENCOUNTER — Encounter (HOSPITAL_COMMUNITY): Payer: Medicare Other

## 2014-11-15 DIAGNOSIS — E039 Hypothyroidism, unspecified: Secondary | ICD-10-CM | POA: Insufficient documentation

## 2014-11-15 DIAGNOSIS — M150 Primary generalized (osteo)arthritis: Secondary | ICD-10-CM

## 2014-11-15 DIAGNOSIS — Z9011 Acquired absence of right breast and nipple: Secondary | ICD-10-CM

## 2014-11-15 DIAGNOSIS — M159 Polyosteoarthritis, unspecified: Secondary | ICD-10-CM | POA: Insufficient documentation

## 2014-11-15 HISTORY — DX: Acquired absence of right breast and nipple: Z90.11

## 2014-11-18 ENCOUNTER — Other Ambulatory Visit: Payer: Self-pay

## 2014-11-18 DIAGNOSIS — Z1231 Encounter for screening mammogram for malignant neoplasm of breast: Secondary | ICD-10-CM

## 2014-11-19 ENCOUNTER — Ambulatory Visit
Admission: RE | Admit: 2014-11-19 | Discharge: 2014-11-19 | Disposition: A | Discharge status: Home / Self Care | Payer: Medicare Other | Source: Ambulatory Visit

## 2014-11-19 ENCOUNTER — Other Ambulatory Visit: Payer: Self-pay | Admitting: Family Medicine

## 2014-11-19 DIAGNOSIS — Z1231 Encounter for screening mammogram for malignant neoplasm of breast: Secondary | ICD-10-CM

## 2014-11-19 DIAGNOSIS — N644 Mastodynia: Secondary | ICD-10-CM

## 2014-11-21 ENCOUNTER — Other Ambulatory Visit: Payer: Self-pay | Admitting: Family Medicine

## 2014-11-21 DIAGNOSIS — R928 Other abnormal and inconclusive findings on diagnostic imaging of breast: Secondary | ICD-10-CM

## 2014-11-22 ENCOUNTER — Ambulatory Visit
Admission: RE | Admit: 2014-11-22 | Discharge: 2014-11-22 | Disposition: A | Discharge status: Home / Self Care | Payer: Medicare Other | Source: Ambulatory Visit | Attending: Family Medicine | Admitting: Family Medicine

## 2014-11-22 DIAGNOSIS — N644 Mastodynia: Secondary | ICD-10-CM

## 2014-11-22 DIAGNOSIS — R928 Other abnormal and inconclusive findings on diagnostic imaging of breast: Secondary | ICD-10-CM

## 2015-04-16 ENCOUNTER — Other Ambulatory Visit: Payer: Self-pay | Admitting: Family Medicine

## 2015-04-16 DIAGNOSIS — N6320 Unspecified lump in the left breast, unspecified quadrant: Secondary | ICD-10-CM

## 2015-05-15 ENCOUNTER — Ambulatory Visit
Admission: RE | Admit: 2015-05-15 | Discharge: 2015-05-15 | Disposition: A | Discharge status: Home / Self Care | Payer: Medicare Other | Source: Ambulatory Visit | Attending: Family Medicine | Admitting: Family Medicine

## 2015-05-15 ENCOUNTER — Other Ambulatory Visit: Payer: Medicare Other

## 2015-05-15 DIAGNOSIS — N6320 Unspecified lump in the left breast, unspecified quadrant: Secondary | ICD-10-CM

## 2016-02-12 ENCOUNTER — Ambulatory Visit (INDEPENDENT_AMBULATORY_CARE_PROVIDER_SITE_OTHER): Payer: Medicare Other | Admitting: Otolaryngology

## 2016-02-12 DIAGNOSIS — H903 Sensorineural hearing loss, bilateral: Secondary | ICD-10-CM

## 2016-02-12 DIAGNOSIS — H8111 Benign paroxysmal vertigo, right ear: Secondary | ICD-10-CM

## 2016-02-12 DIAGNOSIS — R42 Dizziness and giddiness: Secondary | ICD-10-CM | POA: Diagnosis not present

## 2016-03-11 ENCOUNTER — Ambulatory Visit (INDEPENDENT_AMBULATORY_CARE_PROVIDER_SITE_OTHER): Payer: Medicare Other | Admitting: Otolaryngology

## 2016-03-11 DIAGNOSIS — K219 Gastro-esophageal reflux disease without esophagitis: Secondary | ICD-10-CM | POA: Diagnosis not present

## 2016-03-11 DIAGNOSIS — H8111 Benign paroxysmal vertigo, right ear: Secondary | ICD-10-CM

## 2016-03-11 DIAGNOSIS — R490 Dysphonia: Secondary | ICD-10-CM | POA: Diagnosis not present

## 2016-04-13 DIAGNOSIS — I2583 Coronary atherosclerosis due to lipid rich plaque: Secondary | ICD-10-CM

## 2016-04-13 DIAGNOSIS — E1169 Type 2 diabetes mellitus with other specified complication: Secondary | ICD-10-CM | POA: Insufficient documentation

## 2016-04-13 DIAGNOSIS — I2510 Atherosclerotic heart disease of native coronary artery without angina pectoris: Secondary | ICD-10-CM

## 2016-04-13 DIAGNOSIS — E785 Hyperlipidemia, unspecified: Secondary | ICD-10-CM

## 2016-04-13 HISTORY — DX: Coronary atherosclerosis due to lipid rich plaque: I25.83

## 2016-04-13 HISTORY — DX: Atherosclerotic heart disease of native coronary artery without angina pectoris: I25.10

## 2016-04-22 ENCOUNTER — Ambulatory Visit (INDEPENDENT_AMBULATORY_CARE_PROVIDER_SITE_OTHER): Payer: Medicare Other | Admitting: Otolaryngology

## 2016-04-22 DIAGNOSIS — K219 Gastro-esophageal reflux disease without esophagitis: Secondary | ICD-10-CM | POA: Diagnosis not present

## 2016-04-22 DIAGNOSIS — R490 Dysphonia: Secondary | ICD-10-CM

## 2016-07-13 ENCOUNTER — Other Ambulatory Visit: Payer: Self-pay | Admitting: Family Medicine

## 2016-07-13 DIAGNOSIS — Z9011 Acquired absence of right breast and nipple: Secondary | ICD-10-CM

## 2016-07-13 DIAGNOSIS — Z1231 Encounter for screening mammogram for malignant neoplasm of breast: Secondary | ICD-10-CM

## 2016-07-23 ENCOUNTER — Ambulatory Visit
Admission: RE | Admit: 2016-07-23 | Discharge: 2016-07-23 | Disposition: A | Discharge status: Home / Self Care | Payer: Medicare Other | Source: Ambulatory Visit | Attending: Family Medicine | Admitting: Family Medicine

## 2016-07-23 ENCOUNTER — Encounter: Payer: Self-pay | Admitting: Radiology

## 2016-07-23 DIAGNOSIS — Z9011 Acquired absence of right breast and nipple: Secondary | ICD-10-CM

## 2016-07-23 DIAGNOSIS — Z1231 Encounter for screening mammogram for malignant neoplasm of breast: Secondary | ICD-10-CM

## 2016-12-13 DIAGNOSIS — K5790 Diverticulosis of intestine, part unspecified, without perforation or abscess without bleeding: Secondary | ICD-10-CM | POA: Insufficient documentation

## 2016-12-13 DIAGNOSIS — N3281 Overactive bladder: Secondary | ICD-10-CM | POA: Insufficient documentation

## 2016-12-13 HISTORY — DX: Diverticulosis of intestine, part unspecified, without perforation or abscess without bleeding: K57.90

## 2017-02-01 DIAGNOSIS — K649 Unspecified hemorrhoids: Secondary | ICD-10-CM | POA: Insufficient documentation

## 2017-03-24 DIAGNOSIS — Z780 Asymptomatic menopausal state: Secondary | ICD-10-CM | POA: Insufficient documentation

## 2017-05-12 ENCOUNTER — Encounter (INDEPENDENT_AMBULATORY_CARE_PROVIDER_SITE_OTHER): Payer: Self-pay | Admitting: Orthopaedic Surgery

## 2017-05-12 ENCOUNTER — Ambulatory Visit (INDEPENDENT_AMBULATORY_CARE_PROVIDER_SITE_OTHER): Payer: Medicare Other

## 2017-05-12 ENCOUNTER — Ambulatory Visit (INDEPENDENT_AMBULATORY_CARE_PROVIDER_SITE_OTHER): Payer: Medicare Other | Admitting: Orthopaedic Surgery

## 2017-05-12 VITALS — BP 149/78 | HR 90 | Ht 63.0 in | Wt 185.0 lb

## 2017-05-12 DIAGNOSIS — M889 Osteitis deformans of unspecified bone: Secondary | ICD-10-CM | POA: Diagnosis not present

## 2017-05-12 DIAGNOSIS — M79671 Pain in right foot: Secondary | ICD-10-CM

## 2017-05-12 NOTE — Progress Notes (Addendum)
Office Visit Note   Patient: Desiree Bowers           Date of Birth: 1945-10-27           MRN: [id number] Visit Date: 05/12/2017              Requested by: No referring provider defined for this encounter. PCP: Bridget Hartshorn, NP   Assessment & Plan: Visit Diagnoses:  1. Pain in right foot   2. Osteitis deformans     Plan: repeat lumbar epidural.  She got relief from it in the past with Dr. Ernestina Patches.  MRI scan shows some degenerative changes at L4-5 and L5-S1 with pagetoid type changes in the body of the sacrum.  We discussed trying 1 of the other biphosphonate for treatment of her Paget's disease.  Because she previously had pain when she tried it she states she does not want to consider this at this time.  Call if she like to proceed with an epidural previously gave her many months of good relief.  She has degenerative changes fourth and fifth tarsometatarsal joint with palpable osteophytes and tenderness I recommend a shoe with arch support and firm last with minimal flexibility to help unload the arch of her foot and this should improve her pain significantly.  Follow-Up Instructions: Return if symptoms worsen or fail to improve.   Orders:  Orders Placed This Encounter  Procedures  . XR Foot Complete Right   No orders of the defined types were placed in this encounter.     Procedures: No procedures performed   Clinical Data: No additional findings.   Subjective: Chief Complaint  Patient presents with  . Lower Back - Pain  . Right Foot - Pain    HPI 70 year old female returns with ongoing pain in her back has some difficulty when she tries to get up from sitting position feels like her back catches is hard for her to get up straight for period of time.  She had previous MRI scan which showed degenerative facet changes at L4-5 and L5-S1 with moderate right greater than left foraminal narrowing and narrowing of the lateral recess.  No significant central stenosis.   Small bulge L5-S1 with degenerative facet changes.  Minimal change from previous MRI done in 2015.  Review of Systems Paget's disease.  She tried  onebiphosphonate and had increased pain and stopped it unwilling to try any other bisphosphonates.  Positive for previous history of atrial fibrillation.  Right mastectomy for breast cancer.  Type 2 diabetes, hyperlipidemia, coronary artery disease, anxiety.  Hypertension study of diverticulosis.  Otherwise negative as it pertains HPI.   Objective: Vital Signs: BP (!) 149/78   Pulse 90   Ht 5\' 3"  (1.6 m)   Wt 185 lb (83.9 kg)   BMI 32.77 kg/m   Physical Exam  Constitutional: She is oriented to person, place, and time. She appears well-developed.  HENT:  Head: Normocephalic.  Right Ear: External ear normal.  Left Ear: External ear normal.  Eyes: Pupils are equal, round, and reactive to light.  Neck: No tracheal deviation present. No thyromegaly present.  Cardiovascular: Normal rate.  Pulmonary/Chest: Effort normal.  Abdominal: Soft.  Neurological: She is alert and oriented to person, place, and time.  Skin: Skin is warm and dry.  Psychiatric: She has a normal mood and affect. Her behavior is normal.    Ortho Exam patient has minimal discomfort with straight leg raising.  Mild trochanteric bursal tenderness on the right.  Right foot shows prominence of the tarsal metatarsal joint of the cuboid and fourth and fifth metatarsal which is mildly tender.  No ganglion noted.  Peroneals are strong.  Lateral ankle exam is normal no ankle effusion noted. Specialty Comments:  No specialty comments available.  Imaging: Three-view x-rays right foot obtained.  This shows intramedullary stripes of bone consistent with osteitis deformans.  (Paget's disease of the bone).  There is joint space narrowing of the fourth and fifth tarsometatarsal joint.  Impression primary bone disease Paget's noted with degenerative changes between the cuboid and adjacent  fourth and fifth metatarsals.   PMFS History: Patient Active Problem List   Diagnosis Date Noted  . Post-menopausal 03/24/2017  . Acute hemorrhoid 02/01/2017  . Overactive bladder 12/13/2016  . Diverticulosis of intestine without bleeding 12/13/2016  . DM type 2 with diabetic dyslipidemia ([place]) 04/13/2016  . Coronary artery disease due to lipid rich plaque 04/13/2016  . Primary osteoarthritis involving multiple joints 11/15/2014  . History of right mastectomy 11/15/2014  . Acquired hypothyroidism 11/15/2014  . Paroxysmal atrial fibrillation ([place]) 06/19/2014  . Angina pectoris ([place]) 06/19/2014  . Gastroesophageal reflux disease without esophagitis 04/04/2014  . Anxiety, generalized 04/04/2014  . ANA positive 01/18/2014  . 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 Essential (primary) hypertension 09/24/2013   Past Medical History:  Diagnosis Date  . Diabetes mellitus without complication ([place])   . Hypothyroidism   . Paget's disease of the bone     No family history on file.  Past Surgical History:  Procedure Laterality Date  . MASTECTOMY Right   . MASTECTOMY     Social History   Occupational History  . Not on file  Tobacco Use  . Smoking status: Former Research scientist (life sciences)  . Smokeless tobacco: Never Used  Substance and Sexual Activity  . Alcohol use: No    Frequency: Never  . Drug use: No  . Sexual activity: Not on file

## 2017-05-18 ENCOUNTER — Encounter (INDEPENDENT_AMBULATORY_CARE_PROVIDER_SITE_OTHER): Payer: Self-pay | Admitting: Orthopaedic Surgery

## 2017-06-30 ENCOUNTER — Telehealth (INDEPENDENT_AMBULATORY_CARE_PROVIDER_SITE_OTHER): Payer: Self-pay | Admitting: Radiology

## 2017-06-30 DIAGNOSIS — M545 Low back pain: Principal | ICD-10-CM

## 2017-06-30 DIAGNOSIS — G8929 Other chronic pain: Secondary | ICD-10-CM

## 2017-06-30 NOTE — Addendum Note (Signed)
Addended by: Meyer Cory on: 06/30/2017 03:57 PM   Modules accepted: Orders

## 2017-06-30 NOTE — Telephone Encounter (Signed)
Order entered. Patient advised.  

## 2017-06-30 NOTE — Telephone Encounter (Signed)
Patient called and states she would like to proceed with lumbar epidural steroid injection. OK to enter order?

## 2017-06-30 NOTE — Telephone Encounter (Signed)
Yes thank you proceed with repeat ESI

## 2017-07-14 ENCOUNTER — Ambulatory Visit (INDEPENDENT_AMBULATORY_CARE_PROVIDER_SITE_OTHER): Payer: Medicare Other | Admitting: Physical Medicine and Rehabilitation

## 2017-07-14 ENCOUNTER — Encounter (INDEPENDENT_AMBULATORY_CARE_PROVIDER_SITE_OTHER): Payer: Self-pay | Admitting: Physical Medicine and Rehabilitation

## 2017-07-14 ENCOUNTER — Ambulatory Visit (INDEPENDENT_AMBULATORY_CARE_PROVIDER_SITE_OTHER): Payer: Self-pay

## 2017-07-14 VITALS — BP 148/76 | HR 77 | Temp 97.6°F

## 2017-07-14 DIAGNOSIS — M47816 Spondylosis without myelopathy or radiculopathy, lumbar region: Secondary | ICD-10-CM | POA: Diagnosis not present

## 2017-07-14 MED ORDER — METHYLPREDNISOLONE ACETATE 80 MG/ML IJ SUSP
80.0000 mg | Freq: Once | INTRAMUSCULAR | Status: AC
  Administered 2017-07-14: 80 mg

## 2017-07-14 NOTE — Progress Notes (Deleted)
Pt states a sharp pain in lower back that radiates through the right thigh. Pt states pain has gotten worse over the past year. Pt states any movement can bring the sharp pain in her lower back. Pt states using ice and heating pad eases pain. +Driver, -BT, -Dye Allergies.

## 2017-07-14 NOTE — Patient Instructions (Signed)

## 2017-08-01 NOTE — Procedures (Signed)
Lumbar Facet Joint Intra-Articular Injection(s) with Fluoroscopic Guidance  Patient: Desiree Bowers      Date of Birth: 1946-05-30 MRN: [id number] PCP: Bridget Hartshorn, NP      Visit Date: 07/14/2017   Universal Protocol:    Date/Time: 07/14/2017  Consent Given By: the patient  Position: PRONE   Additional Comments: Vital signs were monitored before and after the procedure. Patient was prepped and draped in the usual sterile fashion. The correct patient, procedure, and site was verified.   Injection Procedure Details:  Procedure Site One Meds Administered:  Meds ordered this encounter  Medications  . methylPREDNISolone acetate (DEPO-MEDROL) injection 80 mg     Laterality: Bilateral  Location/Site:  L4-L5  Needle size: 22 guage  Needle type: Spinal  Needle Placement: Articular  Findings:  -Comments: Excellent flow of contrast producing a partial arthrogram.  Procedure Details: The fluoroscope beam is vertically oriented in AP, and the inferior recess is visualized beneath the lower pole of the inferior apophyseal process, which represents the target point for needle insertion. When direct visualization is difficult the target point is located at the medial projection of the vertebral pedicle. The region overlying each aforementioned target is locally anesthetized with a 1 to 2 ml. volume of 1% Lidocaine without Epinephrine.   The spinal needle was inserted into each of the above mentioned facet joints using biplanar fluoroscopic guidance. A 0.25 to 0.5 ml. volume of Isovue-250 was injected and a partial facet joint arthrogram was obtained. A single spot film was obtained of the resulting arthrogram.    One to 1.25 ml of the steroid/anesthetic solution was then injected into each of the facet joints noted above.   Additional Comments:  The patient tolerated the procedure well Dressing: Band-Aid    Post-procedure details: Patient was observed during the  procedure. Post-procedure instructions were reviewed.  Patient left the clinic in stable condition.

## 2017-08-01 NOTE — Progress Notes (Signed)
Desiree Bowers - 72 y.o. female MRN [id number]  Date of birth: [birth year]  Office Visit Note: Visit Date: 07/14/2017 PCP: Bridget Hartshorn, NP Referred by: Bridget Hartshorn, NP  Subjective: Chief Complaint  Patient presents with  . Lower Back - Pain  . Right Thigh - Pain   HPI: Desiree Bowers is a 72 year old [place] for facet joint block.  She is experiencing bilateral low back pain with right more than left hip pain.  We are going to complete diagnostic and hopefully therapeutic bilateral L4-5 facet joint blocks.  She reports mainly a very sharp lower back pain worse with standing and ambulating that is gotten worse over the last year.  He does use heat and ice.  The injection  will be diagnostic and hopefully therapeutic. The patient has failed conservative care including time, medications and activity modification.    ROS Otherwise per HPI.  Assessment & Plan: Visit Diagnoses:  1. Spondylosis without myelopathy or radiculopathy, lumbar region     Plan: No additional findings.   Meds & Orders:  Meds ordered this encounter  Medications  . methylPREDNISolone acetate (DEPO-MEDROL) injection 80 mg    Orders Placed This Encounter  Procedures  . Facet Injection  . XR C-ARM NO REPORT    Follow-up: Return if symptoms worsen or fail to improve, for Dr. Lorin Mercy.   Procedures: No procedures performed  Lumbar Facet Joint Intra-Articular Injection(s) with Fluoroscopic Guidance  Patient: Desiree Bowers      Date of Birth: 26-Jan-1946 MRN: [id number] PCP: Bridget Hartshorn, NP      Visit Date: 07/14/2017   Universal Protocol:    Date/Time: 07/14/2017  Consent Given By: the patient  Position: PRONE   Additional Comments: Vital signs were monitored before and after the procedure. Patient was prepped and draped in the usual sterile fashion. The correct patient, procedure, and site was verified.   Injection Procedure Details:  Procedure Site One Meds Administered:    Meds ordered this encounter  Medications  . methylPREDNISolone acetate (DEPO-MEDROL) injection 80 mg     Laterality: Bilateral  Location/Site:  L4-L5  Needle size: 22 guage  Needle type: Spinal  Needle Placement: Articular  Findings:  -Comments: Excellent flow of contrast producing a partial arthrogram.  Procedure Details: The fluoroscope beam is vertically oriented in AP, and the inferior recess is visualized beneath the lower pole of the inferior apophyseal process, which represents the target point for needle insertion. When direct visualization is difficult the target point is located at the medial projection of the vertebral pedicle. The region overlying each aforementioned target is locally anesthetized with a 1 to 2 ml. volume of 1% Lidocaine without Epinephrine.   The spinal needle was inserted into each of the above mentioned facet joints using biplanar fluoroscopic guidance. A 0.25 to 0.5 ml. volume of Isovue-250 was injected and a partial facet joint arthrogram was obtained. A single spot film was obtained of the resulting arthrogram.    One to 1.25 ml of the steroid/anesthetic solution was then injected into each of the facet joints noted above.   Additional Comments:  The patient tolerated the procedure well Dressing: Band-Aid    Post-procedure details: Patient was observed during the procedure. Post-procedure instructions were reviewed.  Patient left the clinic in stable condition.    Clinical History: No specialty comments available.  She reports that she has quit smoking. she has never used smokeless tobacco. No results for input(s): HGBA1C, LABURIC in the last 8760 hours.  Objective:  VS:  HT:    WT:   BMI:     BP:(!) 148/76  HR:77bpm  TEMP:97.6 F (36.4 C)(Oral)  RESP:95 % Physical Exam  Ortho Exam Imaging: No results found.  Past Medical/Family/Surgical/Social History: Medications & Allergies reviewed per EMR Patient Active Problem List    Diagnosis Date Noted  . Post-menopausal 03/24/2017  . Acute hemorrhoid 02/01/2017  . Overactive bladder 12/13/2016  . Diverticulosis of intestine without bleeding 12/13/2016  . DM type 2 with diabetic dyslipidemia ([place]) 04/13/2016  . Coronary artery disease due to lipid rich plaque 04/13/2016  . Primary osteoarthritis involving multiple joints 11/15/2014  . History of right mastectomy 11/15/2014  . Acquired hypothyroidism 11/15/2014  . Paroxysmal atrial fibrillation ([place]) 06/19/2014  . Angina pectoris ([place]) 06/19/2014  . Gastroesophageal reflux disease without esophagitis 04/04/2014  . Anxiety, generalized 04/04/2014  . ANA positive 01/18/2014  . 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 Essential (primary) hypertension 09/24/2013   Past Medical History:  Diagnosis Date  . Diabetes mellitus without complication ([place])   . Hypothyroidism   . Paget's disease of the bone    History reviewed. No pertinent family history. Past Surgical History:  Procedure Laterality Date  . MASTECTOMY Right   . MASTECTOMY     Social History   Occupational History  . Not on file  Tobacco Use  . Smoking status: Former Research scientist (life sciences)  . Smokeless tobacco: Never Used  Substance and Sexual Activity  . Alcohol use: No    Frequency: Never  . Drug use: No  . Sexual activity: Not on file

## 2017-08-02 ENCOUNTER — Ambulatory Visit (INDEPENDENT_AMBULATORY_CARE_PROVIDER_SITE_OTHER): Payer: Self-pay | Admitting: Orthopaedic Surgery

## 2017-08-04 ENCOUNTER — Encounter (INDEPENDENT_AMBULATORY_CARE_PROVIDER_SITE_OTHER): Payer: Self-pay | Admitting: Orthopaedic Surgery

## 2017-08-04 ENCOUNTER — Ambulatory Visit (INDEPENDENT_AMBULATORY_CARE_PROVIDER_SITE_OTHER): Payer: Medicare Other | Admitting: Orthopaedic Surgery

## 2017-08-04 VITALS — BP 118/70 | HR 86

## 2017-08-04 DIAGNOSIS — M47816 Spondylosis without myelopathy or radiculopathy, lumbar region: Secondary | ICD-10-CM | POA: Diagnosis not present

## 2017-08-04 NOTE — Progress Notes (Signed)
Office Visit Note   Patient: Desiree Bowers           Date of Birth: Aug 06, 1945           MRN: [id number] Visit Date: 08/04/2017              Requested by: Bridget Hartshorn, NP [address] [place], [place] [address] PCP: Bridget Hartshorn, NP   Assessment & Plan: Visit Diagnoses:  1. Facet degeneration of lumbar region     Plan: Patient got excellent relief with facet injections.  Back to normal activity and can return on a as needed basis.  Follow-Up Instructions: Return if symptoms worsen or fail to improve.   Orders:  No orders of the defined types were placed in this encounter.  No orders of the defined types were placed in this encounter.     Procedures: No procedures performed   Clinical Data: No additional findings.   Subjective: Chief Complaint  Patient presents with  . Lower Back - Pain, Follow-up    HPI 72 year old female with Paget's disease of bone returns post epidural injection for problems with degenerative facet changes at L4-5 and L5-S1 greater on the right than left.  States the epidural injection with Dr. Ernestina Patches on 07/14/2017 gave her excellent relief and she is not feeling any symptoms whatsoever.  Her leg feels good she is back to normal ambulation and normal household activities without pain.  Review of Systems reviewed updated and unchanged from 05/12/2017   Objective: Vital Signs: BP 118/70   Pulse 86   Physical Exam  Constitutional: She is oriented to person, place, and time. She appears well-developed.  HENT:  Head: Normocephalic.  Right Ear: External ear normal.  Left Ear: External ear normal.  Eyes: Pupils are equal, round, and reactive to light.  Neck: No tracheal deviation present. No thyromegaly present.  Cardiovascular: Normal rate.  Pulmonary/Chest: Effort normal.  Abdominal: Soft.  Neurological: She is alert and oriented to person, place, and time.  Skin: Skin is warm and dry.  Psychiatric: She has a normal  mood and affect. Her behavior is normal.    Ortho Exam prominence of the left distal radius likely due to some pagetoid bone.  Good supination pronation.  Gait no isolated motor weakness.  Specialty Comments:  No specialty comments available.  Imaging: No results found.   PMFS History: Patient Active Problem List   Diagnosis Date Noted  . Post-menopausal 03/24/2017  . Acute hemorrhoid 02/01/2017  . Overactive bladder 12/13/2016  . Diverticulosis of intestine without bleeding 12/13/2016  . DM type 2 with diabetic dyslipidemia ([place]) 04/13/2016  . Coronary artery disease due to lipid rich plaque 04/13/2016  . Primary osteoarthritis involving multiple joints 11/15/2014  . History of right mastectomy 11/15/2014  . Acquired hypothyroidism 11/15/2014  . Paroxysmal atrial fibrillation ([place]) 06/19/2014  . Angina pectoris ([place]) 06/19/2014  . Gastroesophageal reflux disease without esophagitis 04/04/2014  . Anxiety, generalized 04/04/2014  . ANA positive 01/18/2014  . 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 Essential (primary) hypertension 09/24/2013   Past Medical History:  Diagnosis Date  . Diabetes mellitus without complication ([place])   . Hypothyroidism   . Paget's disease of the bone     No family history on file.  Past Surgical History:  Procedure Laterality Date  . MASTECTOMY Right   . MASTECTOMY     Social History   Occupational History  . Not on file  Tobacco Use  . Smoking status: Former Research scientist (life sciences)  .  Smokeless tobacco: Never Used  Substance and Sexual Activity  . Alcohol use: No    Frequency: Never  . Drug use: No  . Sexual activity: Not on file

## 2017-08-08 DIAGNOSIS — I890 Lymphedema, not elsewhere classified: Secondary | ICD-10-CM | POA: Insufficient documentation

## 2017-08-25 ENCOUNTER — Other Ambulatory Visit: Payer: Self-pay | Admitting: Family Medicine

## 2017-08-25 DIAGNOSIS — Z1231 Encounter for screening mammogram for malignant neoplasm of breast: Secondary | ICD-10-CM

## 2017-09-10 IMAGING — MG DIGITAL SCREENING UNILATERAL LEFT MAMMOGRAM WITH CAD
2 series · 2 of 2 positions shown · non-contrast
Comparison: Previous exam(s).

CLINICAL DATA: Screening. Prior malignant right mastectomy in 9111.

EXAM:
DIGITAL SCREENING UNILATERAL LEFT MAMMOGRAM WITH CAD

[L MLO]
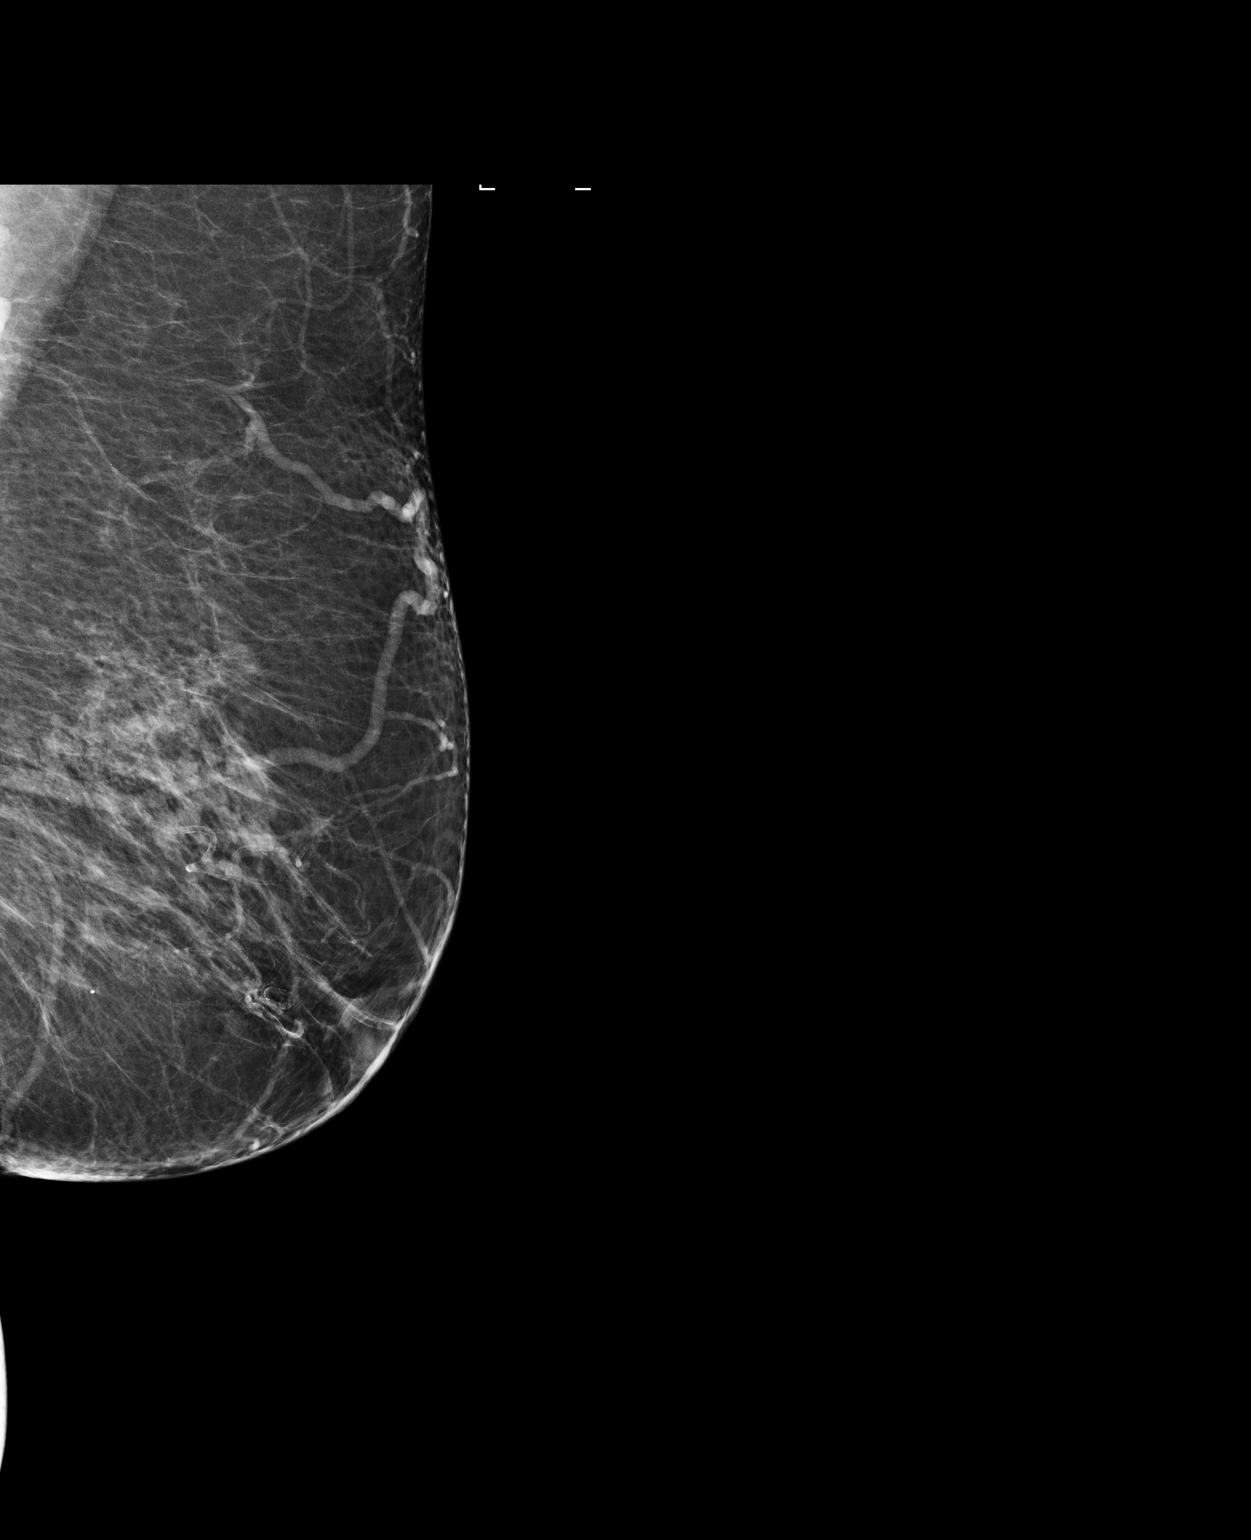

[L CC]
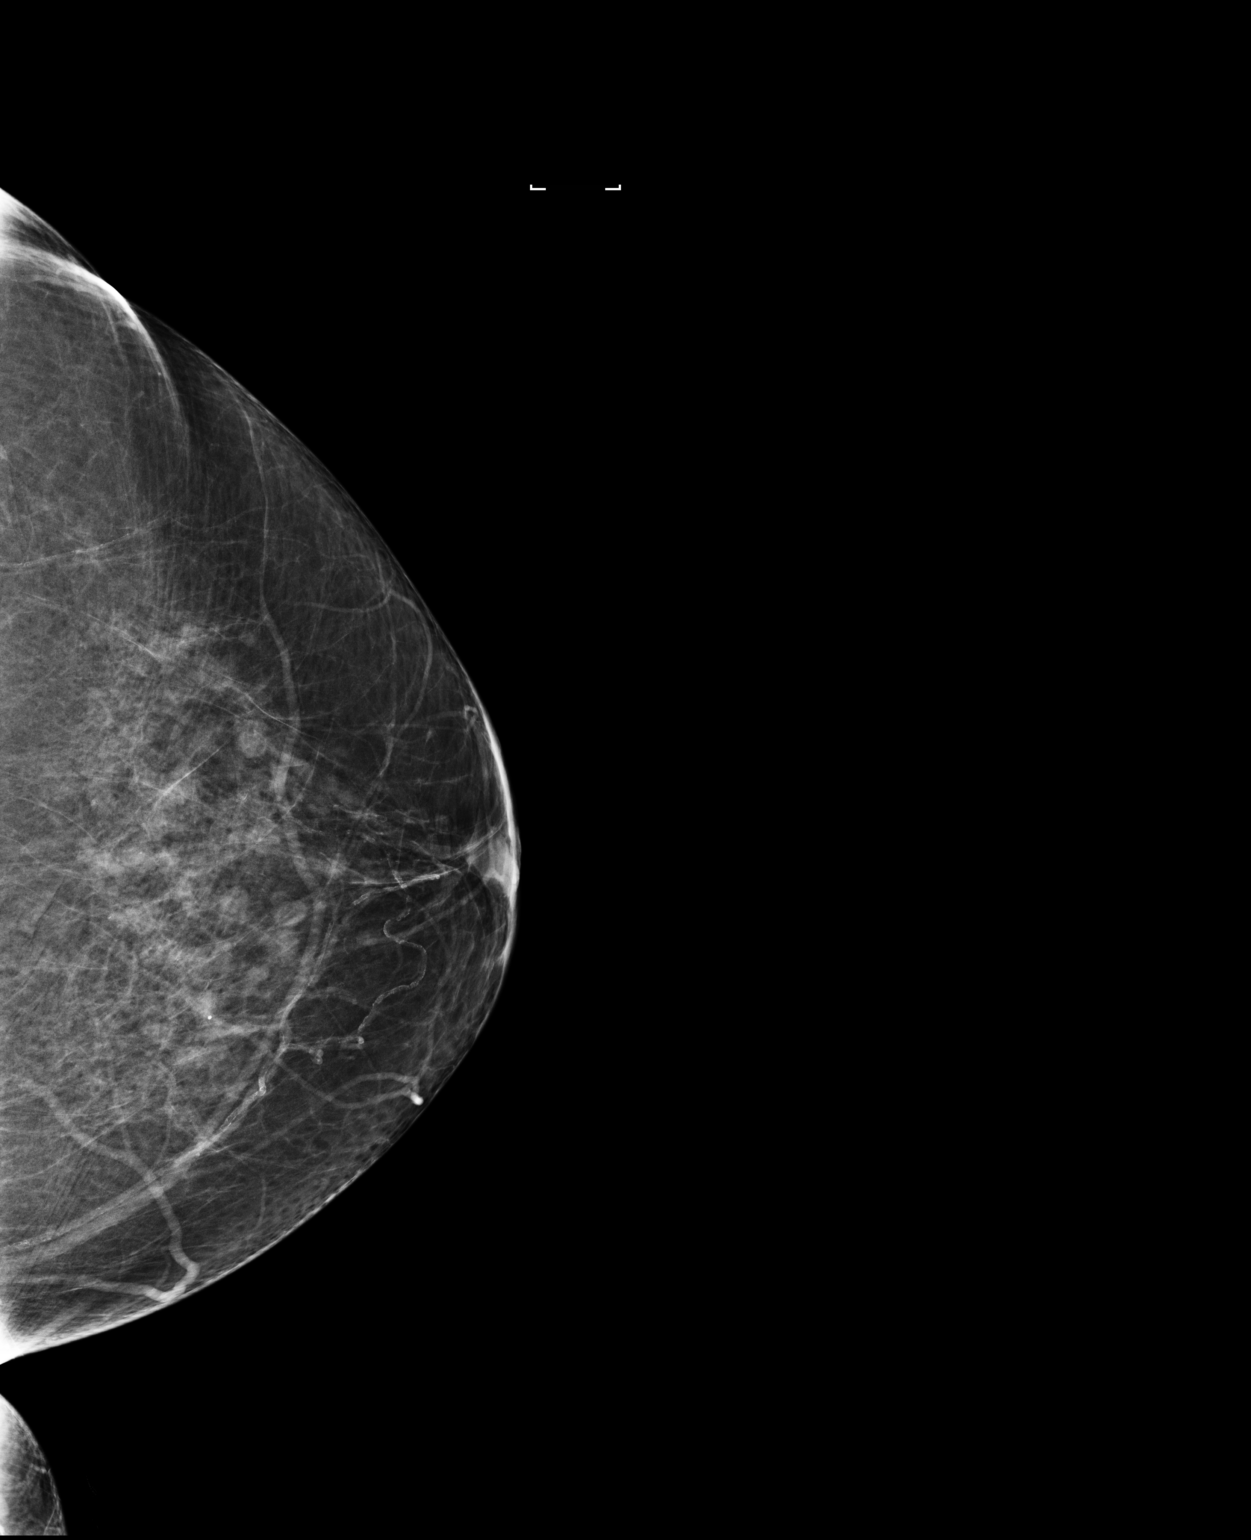

[2 of 2 positions shown; findings below may reference images not displayed]

ACR Breast Density Category b: There are scattered areas of
fibroglandular density.
FINDINGS: The patient has had a right mastectomy. There are no findings
suspicious for malignancy in the left breast.

Images were processed with CAD.
IMPRESSION: No mammographic evidence of malignancy. A result letter of this
screening mammogram will be mailed directly to the patient.

RECOMMENDATION:
Screening mammogram in one year.  (Code:Q6-B-6D6)

BI-RADS CATEGORY  1: Negative.

## 2017-09-22 ENCOUNTER — Ambulatory Visit: Payer: Medicare Other

## 2018-01-05 ENCOUNTER — Other Ambulatory Visit: Payer: Self-pay | Admitting: Gastroenterology

## 2018-01-05 DIAGNOSIS — Z1211 Encounter for screening for malignant neoplasm of colon: Secondary | ICD-10-CM

## 2018-01-05 DIAGNOSIS — Z800 Family history of malignant neoplasm of digestive organs: Secondary | ICD-10-CM

## 2018-02-03 ENCOUNTER — Ambulatory Visit: Payer: Medicare Other

## 2018-02-13 ENCOUNTER — Ambulatory Visit (INDEPENDENT_AMBULATORY_CARE_PROVIDER_SITE_OTHER): Payer: Medicare Other | Admitting: Otolaryngology

## 2018-02-13 DIAGNOSIS — R42 Dizziness and giddiness: Secondary | ICD-10-CM

## 2018-02-13 DIAGNOSIS — R490 Dysphonia: Secondary | ICD-10-CM | POA: Diagnosis not present

## 2018-02-13 DIAGNOSIS — K219 Gastro-esophageal reflux disease without esophagitis: Secondary | ICD-10-CM

## 2018-02-13 DIAGNOSIS — H8111 Benign paroxysmal vertigo, right ear: Secondary | ICD-10-CM | POA: Diagnosis not present

## 2018-04-19 ENCOUNTER — Encounter: Payer: Medicare Other | Attending: Internal Medicine | Admitting: Nutrition

## 2018-04-19 VITALS — Ht 65.0 in | Wt 187.0 lb

## 2018-04-19 DIAGNOSIS — E669 Obesity, unspecified: Secondary | ICD-10-CM

## 2018-04-19 DIAGNOSIS — E1165 Type 2 diabetes mellitus with hyperglycemia: Secondary | ICD-10-CM

## 2018-04-19 DIAGNOSIS — E118 Type 2 diabetes mellitus with unspecified complications: Principal | ICD-10-CM

## 2018-04-19 DIAGNOSIS — IMO0002 Reserved for concepts with insufficient information to code with codable children: Secondary | ICD-10-CM

## 2018-04-19 NOTE — Progress Notes (Signed)
  Medical Nutrition Therapy:  Appt start time: 1100 end time:  1200.  Assessment:  Primary concerns today: Prediabetes,, Obesity. Widowed.  Eats 2-3 meals per day. Currently sees DR. Buddy Duty for her DM and Thyroid.  Currenlty diet controlled right now. Drinks water/lemon juice and vinegar. Complains of blurry vision sometimes.  Check BS in am: usually runs 150-170's in am if she eats after supper.   BS was 108 this am. Physical activity walk on treadmilll some. Wants to lose weight less than 170 lbs. .  Appears to be engaged in making changes with her diet and exercise to improve DM and health. Diet is high in fat and sodium.   Currently not on any medications for her DM. Trying to control it with diet and exercise. Notes from Dr. Cindra Eves ofice: A1C 6.4%. Impaired fasting glucose/Prediabetes   Wt Readings from Last 3 Encounters:  04/19/18 187 lb (84.8 kg)  05/12/17 185 lb (83.9 kg)   Ht Readings from Last 3 Encounters:  04/19/18 5\' 5"  (1.651 m)  05/12/17 5\' 3"  (1.6 m)   Body mass index is 31.12 kg/m.   Preferred Learning Style:    No preference indicated   Learning Readiness:  Ready  Change in progress   MEDICATIONS:   DIETARY INTAKE:  24-hr recall:  B ( AM): scrambled eggs, 1 slice cheese toast and coffee  Snk ( AM): L ( PM): bowl vegetable soup and PB sandwich and milk Snk ( PM):  D ( PM): 2 hot dogs with buns, Diet CF Pepsi,  Snk ( PM): banana cream sf pie, nuts Beverages: diet sodas, milk, water  Usual physical activity: walks some  Estimated energy needs: 1200 calories 135 g carbohydrates 90 g protein 33 g fat  Progress Towards Goal(s):  In progress.   Nutritional Diagnosis:  NB-1.1 Food and nutrition-related knowledge deficit As related to Obesity and Prediabetes.  As evidenced by BMI 31 and elevated BS 150's in am..    Intervention: Nutrition and  Pr-Diabetes education provided on My Plate, CHO counting, meal planning, portion sizes, timing of meals,  avoiding snacks between meals unless having a low blood sugar, target ranges for A1C and blood sugars, signs/symptoms and treatment of hyper/hypoglycemia, monitoring blood sugars, taking medications as prescribed, benefits of exercising 30 minutes per day and prevention of complications of DM. Mar[facility] Kitchen Goals 1. Follow MY Plate 2. Eat 2 carb choices per meal 3. Drink only water and lemon and diet sodas. 4. Walk 30 minutes 4 days a week Lose 1 lb per week Cut out snacks after supper.   Teaching Method Utilized:  Visual Auditory Hands on  Handouts given during visit include:  The Plate Method   Meal Plan Card    Barriers to learning/adherence to lifestyle change: none  Demonstrated degree of understanding via:  Teach Back   Monitoring/Evaluation:  Dietary intake, exercise, meal planing, and body weight in 3 month(s).

## 2018-04-19 NOTE — Patient Instructions (Signed)
Goals 1. Follow MY Plate 2. Eat 2 carb choices per meal 3. Drink only water and lemon and diet sodas. 4. Walk 30 minutes 4 days a week Lose 1 lb per week Cut out snacks after supper.

## 2018-05-03 ENCOUNTER — Encounter: Payer: Self-pay | Admitting: Nutrition

## 2018-07-20 ENCOUNTER — Encounter: Payer: Medicare Other | Attending: Adult Health Nurse Practitioner | Admitting: Nutrition

## 2018-07-20 VITALS — Ht 65.0 in | Wt 183.0 lb

## 2018-07-20 DIAGNOSIS — E669 Obesity, unspecified: Secondary | ICD-10-CM | POA: Insufficient documentation

## 2018-07-20 DIAGNOSIS — R739 Hyperglycemia, unspecified: Secondary | ICD-10-CM

## 2018-07-20 NOTE — Progress Notes (Signed)
  Medical Nutrition Therapy:  Appt start time:1000 end time:  1030.  Assessment:  Primary concerns today:  PreDM, Obesity. F/u . Widowed.  Eats 2-3 meals per day.   Working eating more fruit, vegetables and nuts. Lose 4 lbs. Says her liver feels much better since cutting out the sweets and losing a few pounds. Limited with exercise due to ankle and feet issues.  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 Struggles with planning meals and cooking. Tends to grab and go. Has been working on eating more fruit but needs more water and lower carb vegetables and high fiber foods.. Sees DR. Buddy Duty.Notes from Dr. Cindra Eves ofice: A1C 6.4%. Impaired fasting glucose/Prediabetes.  Wt Readings from Last 3 Encounters:  07/20/18 183 lb (83 kg)  04/19/18 187 lb (84.8 kg)  05/12/17 185 lb (83.9 kg)   Ht Readings from Last 3 Encounters:  07/20/18 5\' 5"  (1.651 m)  04/19/18 5\' 5"  (1.651 m)  05/12/17 5\' 3"  (1.6 m)   Body mass index is 30.45 kg/m.   Preferred Learning Style:    No preference indicated   Learning Readiness:  Ready  Change in progress   MEDICATIONS:   DIETARY INTAKE:  24-hr recall:  B ( AM): scrambled eggs, 1 slice cheese toast and coffee  Snk ( AM): L ( PM): Ritz crackers, pB-4, diet soda Snk ( PM):  D ( PM):  Grilled cheese sandwich and apple, Coffee Snk ( PM):   Beverages: diet sodas, milk, water  Usual physical activity: walks some  Estimated energy needs: 1200 calories 135 g carbohydrates 90 g protein 33 g fat  Progress Towards Goal(s):  In progress.   Nutritional Diagnosis:  NB-1.1 Food and nutrition-related knowledge deficit As related to Obesity and Prediabetes.  As evidenced by BMI 31 and elevated BS 150's in am..    Intervention: Nutrition and  Pr-Diabetes education provided on My Plate, CHO counting, meal planning, portion sizes, timing of meals, avoiding snacks between meals unless having a low blood sugar, target ranges for A1C and blood sugars,  signs/symptoms and treatment of hyper/hypoglycemia, monitoring blood sugars, taking medications as prescribed, benefits of exercising 30 minutes per day and prevention of complications of DM. Marland Kitchen Goals 1. Cut out diet sodas and only do water with lemon 2. Add more vegetables with lunch and dinner 3. Cut out sweets 4. Walk on treadmill when you can   Teaching Method Utilized:  Visual Auditory Hands on  Handouts given during visit include:  The Plate Method   Meal Plan Card    Barriers to learning/adherence to lifestyle change: none  Demonstrated degree of understanding via:  Teach Back   Monitoring/Evaluation:  Dietary intake, exercise, meal planing, and body weight in 3 month(s).

## 2018-07-20 NOTE — Patient Instructions (Signed)
Goals 1. Cut out diet sodas and only do water with lemon 2. Add more vegetables with lunch and dinner 3. Cut out sweets 4. Walk on treadmill when you can

## 2018-07-25 ENCOUNTER — Encounter: Payer: Self-pay | Admitting: Nutrition

## 2018-10-18 ENCOUNTER — Ambulatory Visit: Payer: Medicare Other | Admitting: Nutrition

## 2018-12-20 ENCOUNTER — Other Ambulatory Visit: Payer: Self-pay

## 2018-12-21 ENCOUNTER — Encounter: Payer: Self-pay | Admitting: Family Medicine

## 2018-12-21 ENCOUNTER — Ambulatory Visit (INDEPENDENT_AMBULATORY_CARE_PROVIDER_SITE_OTHER): Payer: Medicare Other | Admitting: Family Medicine

## 2018-12-21 DIAGNOSIS — K219 Gastro-esophageal reflux disease without esophagitis: Secondary | ICD-10-CM

## 2018-12-21 DIAGNOSIS — E785 Hyperlipidemia, unspecified: Secondary | ICD-10-CM

## 2018-12-21 DIAGNOSIS — J302 Other seasonal allergic rhinitis: Secondary | ICD-10-CM

## 2018-12-21 DIAGNOSIS — I10 Essential (primary) hypertension: Secondary | ICD-10-CM | POA: Diagnosis not present

## 2018-12-21 DIAGNOSIS — E039 Hypothyroidism, unspecified: Secondary | ICD-10-CM | POA: Diagnosis not present

## 2018-12-21 DIAGNOSIS — E1169 Type 2 diabetes mellitus with other specified complication: Secondary | ICD-10-CM | POA: Diagnosis not present

## 2018-12-21 DIAGNOSIS — F411 Generalized anxiety disorder: Secondary | ICD-10-CM

## 2018-12-21 DIAGNOSIS — N3281 Overactive bladder: Secondary | ICD-10-CM | POA: Diagnosis not present

## 2018-12-21 DIAGNOSIS — M62838 Other muscle spasm: Secondary | ICD-10-CM

## 2018-12-21 DIAGNOSIS — Z7689 Persons encountering health services in other specified circumstances: Secondary | ICD-10-CM

## 2018-12-21 MED ORDER — ALPRAZOLAM 0.25 MG PO TABS: ORAL_TABLET | ORAL | 1 refills | Status: AC

## 2018-12-21 MED ORDER — CETIRIZINE HCL 10 MG PO TABS: 10.0000 mg | ORAL_TABLET | Freq: Every day | ORAL | 1 refills | Status: DC

## 2018-12-21 MED ORDER — FLUTICASONE PROPIONATE 50 MCG/ACT NA SUSP: 2.0000 | Freq: Every day | NASAL | 2 refills | Status: DC

## 2018-12-21 MED ORDER — SERTRALINE HCL 25 MG PO TABS: 25.0000 mg | ORAL_TABLET | Freq: Every day | ORAL | 2 refills | Status: DC

## 2018-12-21 MED ORDER — OMEPRAZOLE 20 MG PO CPDR: 20.0000 mg | DELAYED_RELEASE_CAPSULE | Freq: Every day | ORAL | 1 refills | Status: DC

## 2018-12-21 MED ORDER — METHOCARBAMOL 500 MG PO TABS: 500.0000 mg | ORAL_TABLET | Freq: Three times a day (TID) | ORAL | 2 refills | Status: DC | PRN

## 2018-12-21 NOTE — Progress Notes (Signed)
New Patient Office Visit  Virtual Visit via Telephone Note  I connected with Desiree Bowers on 12/21/18 at 8:58 AM by telephone and verified that I am speaking with the correct person using two identifiers. Desiree Bowers is currently located in her car and nobody is currently with her during this visit. The provider, Loman Brooklyn, FNP is located in their office at time of visit.  I discussed the limitations, risks, security and privacy concerns of performing an evaluation and management service by telephone and the availability of in person appointments. I also discussed with the patient that there may be a patient responsible charge related to this service. The patient expressed understanding and agreed to proceed.  Subjective:  Patient ID: Desiree Bowers, female    DOB: [birth year]  Age: 73 y.o. MRN: [id number]  CC:  Chief Complaint  Patient presents with  . Establish Care  . Urine Odor    HPI Desiree Bowers presents to establish care and to discuss her urine. She is transferring care from Dr. Murrell Redden office as he has retired and the office has closed.   Patient reports that once last week her urine smelled like fish. She washed up and the smell went away. She has also cut back on sugary foods/drinks and been drinking a lot of water to help. This week the smell came back one day, then went away. She denies dysuria, hematuria, frequency, urgency, fever, and chills. She reports she takes Ditropan for bladder spasms currently. She had taken it in the past and she stopped taking it because it "dries her up really bad".   She is taking Flexeril for muscle spasms in her neck and back but does not take them very often as they make her really sleepy.   Dr. Buddy Duty, endocrinologist in [place], manages her hypothyroidism.   Xanax: Patient reports she was prescribed Xanax years ago after her mastectomy surgery to help her deal with it. States she also takes it for hot flashes that occurred  after a hysterectomy, for "nerve damage from being cut on so much", and for panic attacks. She states she tried to stop taking it once before and that was when she experienced a panic attack. She has not tried any other medications to treat anxiety, hot flashes, or nerve pain.   Allergies: Patient is currently experiencing sinus drainage and a scratchy throat because of it (reason why this had to be a telephone visit). Additionally her ears feel stopped up and she has watery eyes. She states this has been going on x3 years and that she has seen an ENT in the past. She does take Zyrtec daily but hasn't been taking it lately.   GERD: She is taking famotidine BID but states she would much rather have the omeprazole back; she is not sure why this was switched.   Review of Systems  Constitutional: Negative for chills, fever, malaise/fatigue and weight loss.  HENT: Positive for ear pain and sore throat (scratchy). Negative for congestion, ear discharge, nosebleeds, sinus pain and tinnitus.   Eyes: Negative for blurred vision, double vision, pain, discharge and redness.  Respiratory: Positive for cough. Negative for sputum production, shortness of breath and wheezing.   Cardiovascular: Negative for chest pain, palpitations and leg swelling.  Gastrointestinal: Positive for heartburn. Negative for abdominal pain, constipation, diarrhea, nausea and vomiting.  Genitourinary: Negative for dysuria, flank pain, frequency, hematuria and urgency.  Musculoskeletal: Positive for back pain, falls, myalgias and neck pain.  Skin: Negative for rash.  Neurological: Negative for dizziness, seizures, weakness and headaches.  Psychiatric/Behavioral: Negative for depression, substance abuse and suicidal ideas. The patient is nervous/anxious.     Current Outpatient Medications:  .  ALPRAZolam (XANAX) 0.5 MG tablet, TAKE ONE TABLET BY MOUTH TWICE DAILY, Disp: , Rfl:  .  calcium carbonate (OSCAL) 1500 (600 Ca) MG TABS  tablet, Take 2 tablets by mouth daily. , Disp: , Rfl:  .  furosemide (LASIX) 20 MG tablet, Take 20 mg by mouth daily as needed for edema. , Disp: , Rfl:  .  losartan-hydrochlorothiazide (HYZAAR) 100-25 MG tablet, Take 1 tablet by mouth daily. , Disp: , Rfl:  .  magnesium oxide (MAG-OX) 400 (241.3 Mg) MG tablet, Take 400 mg by mouth daily. , Disp: , Rfl:  .  meloxicam (MOBIC) 7.5 MG tablet, Take 7.5 mg by mouth daily. , Disp: , Rfl:  .  omeprazole (PRILOSEC) 20 MG capsule, Take 1 capsule (20 mg total) by mouth daily., Disp: 90 capsule, Rfl: 1 .  oxybutynin (DITROPAN-XL) 10 MG 24 hr tablet, Take 10 mg by mouth daily. , Disp: , Rfl:  .  SYNTHROID 112 MCG tablet, Take 112 mcg by mouth daily before breakfast. , Disp: , Rfl:  .  cetirizine (ZYRTEC) 10 MG tablet, Take 1 tablet (10 mg total) by mouth daily., Disp: 90 tablet, Rfl: 1 .  fluticasone (FLONASE) 50 MCG/ACT nasal spray, Place 2 sprays into both nostrils daily., Disp: 16 g, Rfl: 2 .  methocarbamol (ROBAXIN) 500 MG tablet, Take 1-2 tablets (500-1,000 mg total) by mouth every 8 (eight) hours as needed for muscle spasms., Disp: 30 tablet, Rfl: 2 .  sertraline (ZOLOFT) 25 MG tablet, Take 1 tablet (25 mg total) by mouth daily., Disp: 30 tablet, Rfl: 2  Allergies  Allergen Reactions  . Penicillins Rash  . Gabapentin Other (See Comments)    Couldn't move legs during the night after taking.    Past Medical History:  Diagnosis Date  . Coronary artery disease due to lipid rich plaque 04/13/2016  . Diabetes mellitus without complication ([place])   . Diverticulosis of intestine without bleeding 12/13/2016  . History of right mastectomy 11/15/2014  . Hypothyroidism   . Paget's disease of the bone   . Paroxysmal atrial fibrillation ([place]) 06/19/2014    Past Surgical History:  Procedure Laterality Date  . ABDOMINAL HYSTERECTOMY  1999  . APPENDECTOMY     as a child  . CATARACT EXTRACTION, BILATERAL    . MASTECTOMY Right     Family History  Problem  Relation Age of Onset  . Breast cancer Mother   . Colon cancer Father   . Kidney cancer Father   . Bladder Cancer Sister   . Suicidality Brother   . Heart attack Maternal Grandmother   . Breast cancer Sister   . Stomach cancer Sister   . Ovarian cancer Sister   . Breast cancer Sister   . Breast cancer Sister   . Diabetes Sister   . Other Brother        parathyroid disorder; Engelmann's disease  . Graves' disease Daughter     Social History   Socioeconomic History  . Marital status: Married    Spouse name: Not on file  . Number of children: Not on file  . Years of education: Not on file  . Highest education level: Not on file  Occupational History  . Not on file  Social Needs  . Financial resource strain: Not on  file  . Food insecurity    Worry: Not on file    Inability: Not on file  . Transportation needs    Medical: Not on file    Non-medical: Not on file  Tobacco Use  . Smoking status: Former Research scientist (life sciences)  . Smokeless tobacco: Never Used  Substance and Sexual Activity  . Alcohol use: No    Frequency: Never  . Drug use: No  . Sexual activity: Not on file  Lifestyle  . Physical activity    Days per week: Not on file    Minutes per session: Not on file  . Stress: Not on file  Relationships  . Social Herbalist on phone: Not on file    Gets together: Not on file    Attends religious service: Not on file    Active member of club or organization: Not on file    Attends meetings of clubs or organizations: Not on file    Relationship status: Not on file  . Intimate partner violence    Fear of current or ex partner: Not on file    Emotionally abused: Not on file    Physically abused: Not on file    Forced sexual activity: Not on file  Other Topics Concern  . Not on file  Social History Narrative  . Not on file    Objective:  A&O  No respiratory distress or wheezing audible over the phone Mood, judgement, and thought processes all WNL  Assessment &  Plan:   1. Essential (primary) hypertension - Patient reports BP controlled. - CMP14+EGFR; Future  2. 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 She is going to call and schedule her diabetic eye exam as she missed her last appointment due to COVID-19.  - [facility] [place] Hb A1c Waived; Future - Microalbumin / creatinine urine ratio; Future - CMP14+EGFR; Future  3. Acquired hypothyroidism - Managed by endocrinologist, Dr. Buddy Duty - CMP14+EGFR; Future  4. Overactive bladder - Discussed discontinuing Ditropan and starting patient on Myrbetriq to prevent her from getting so dry but she does not wish to make any changes at this time.  - CMP14+EGFR; Future  5. GAD (generalized anxiety disorder) - Patient is going to start on Zoloft for long term management of anxiety. She was advised to decrease Xanax to 0.5 mg in the AM and 0.25 in the PM after she finishes her current bottle that will be done in a few days. Her last refill was Xanax 0.5 mg (60 tabs) on 11/25/2018 per PDMP. Education provided on GAD. - sertraline (ZOLOFT) 25 MG tablet; Take 1 tablet (25 mg total) by mouth daily.  Dispense: 30 tablet; Refill: 2 - CMP14+EGFR; Future  6. Seasonal allergies - Education provided on allergic rhinitis. Patient to clear symptoms up before coming in for lab work. - cetirizine (ZYRTEC) 10 MG tablet; Take 1 tablet (10 mg total) by mouth daily.  Dispense: 90 tablet; Refill: 1 - fluticasone (FLONASE) 50 MCG/ACT nasal spray; Place 2 sprays into both nostrils daily.  Dispense: 16 g; Refill: 2 - CMP14+EGFR; Future  7. Muscle spasm - Changing from Flexeril to Robaxin to decrease drowsiness.  - methocarbamol (ROBAXIN) 500 MG tablet; Take 1-2 tablets (500-1,000 mg total) by mouth every 8 (eight) hours as needed for muscle spasms.  Dispense: 30 tablet; Refill: 2 - Magnesium; Future - CMP14+EGFR; Future  8. Gastroesophageal reflux disease without esophagitis -  Patient is going  to stop taking famotidine and resume Omeprazole. - omeprazole (PRILOSEC) 20 MG capsule; Take 1 capsule (20 mg total) by mouth daily.  Dispense: 90 capsule; Refill: 1 - CMP14+EGFR; Future  9. Encounter to establish care - Patient to call gastroenterologist to schedule colonoscopy. - Patient to schedule diabetic eye exam.  - Patient declines pneumonia vaccine stating the first one made her sick for a year.  - Mammogram and DEXA requested from [facility].   Time call ended: 9:46 AM  I provided 48 minutes of non-face-to-face time during this encounter.  Follow-up: Return in about 2 months (around 02/21/2019) for follow-up of anxiety.   Loman Brooklyn, FNP

## 2018-12-21 NOTE — Patient Instructions (Addendum)
Call and schedule your colonoscopy AND diabetic eye exam.   Allergic Rhinitis, Adult Allergic rhinitis is a reaction to allergens in the air. Allergens are tiny specks (particles) in the air that cause your body to have an allergic reaction. This condition cannot be passed from person to person (is not contagious). Allergic rhinitis cannot be cured, but it can be controlled. There are two types of allergic rhinitis:  Seasonal. This type is also called hay fever. It happens only during certain times of the year.  Perennial. This type can happen at any time of the year. What are the causes? This condition may be caused by:  Pollen from grasses, trees, and weeds.  House dust mites.  Pet dander.  Mold. What are the signs or symptoms? Symptoms of this condition include:  Sneezing.  Runny or stuffy nose (nasal congestion).  A lot of mucus in the back of the throat (postnasal drip).  Itchy nose.  Tearing of the eyes.  Trouble sleeping.  Being sleepy during day. How is this treated? There is no cure for this condition. You should avoid things that trigger your symptoms (allergens). Treatment can help to relieve symptoms. This may include:  Medicines that block allergy symptoms, such as antihistamines. These may be given as a shot, nasal spray, or pill.  Shots that are given until your body becomes less sensitive to the allergen (desensitization).  Stronger medicines, if all other treatments have not worked. Follow these instructions at home: Avoiding allergens   Find out what you are allergic to. Common allergens include smoke, dust, and pollen.  Avoid them if you can. These are some of the things that you can do to avoid allergens: ? Replace carpet with wood, tile, or vinyl flooring. Carpet can trap dander and dust. ? Clean any mold found in the home. ? Do not smoke. Do not allow smoking in your home. ? Change your heating and air conditioning filter at least once a  month. ? During allergy season:  Keep windows closed as much as you can. If possible, use air conditioning when there is a lot of pollen in the air.  Use a special filter for allergies with your furnace and air conditioner.  Plan outdoor activities when pollen counts are lowest. This is usually during the early morning or evening hours.  If you do go outdoors when pollen count is high, wear a special mask for people with allergies.  When you come indoors, take a shower and change your clothes before sitting on furniture or bedding. General instructions  Do not use fans in your home.  Do not hang clothes outside to dry.  Wear sunglasses to keep pollen out of your eyes.  Wash your hands right away after you touch household pets.  Take over-the-counter and prescription medicines only as told by your doctor.  Keep all follow-up visits as told by your doctor. This is important. Contact a doctor if:  You have a fever.  You have a cough that does not go away (is persistent).  You start to make whistling sounds when you breathe (wheeze).  Your symptoms do not get better with treatment.  You have thick fluid coming from your nose.  You start to have nosebleeds. Get help right away if:  Your tongue or your lips are swollen.  You have trouble breathing.  You feel dizzy or you feel like you are going to pass out (faint).  You have cold sweats. Summary  Allergic rhinitis is a  reaction to allergens in the air.  This condition may be caused by allergens. These include pollen, dust mites, pet dander, and mold.  Symptoms include a runny, itchy nose, sneezing, or tearing eyes. You may also have trouble sleeping or feel sleepy during the day.  Treatment includes taking medicines and avoiding allergens. You may also get shots or take stronger medicines.  Get help if you have a fever or a cough that does not stop. Get help right away if you are short of breath. This information  is not intended to replace advice given to you by your health care provider. Make sure you discuss any questions you have with your health care provider. Document Released: 09/23/2010 Document Revised: 09/12/2018 Document Reviewed: 12/13/2017 Elsevier Patient Education  [place].   Generalized Anxiety Disorder, Adult Generalized anxiety disorder (GAD) is a mental health disorder. People with this condition constantly worry about everyday events. Unlike normal anxiety, worry related to GAD is not triggered by a specific event. These worries also do not fade or get better with time. GAD interferes with life functions, including relationships, work, and school. GAD can vary from mild to severe. People with severe GAD can have intense waves of anxiety with physical symptoms (panic attacks). What are the causes? The exact cause of GAD is not known. What increases the risk? This condition is more likely to develop in:  Women.  People who have a family history of anxiety disorders.  People who are very shy.  People who experience very stressful life events, such as the death of a loved one.  People who have a very stressful family environment. What are the signs or symptoms? People with GAD often worry excessively about many things in their lives, such as their health and family. They may also be overly concerned about:  Doing well at work.  Being on time.  Natural disasters.  Friendships. Physical symptoms of GAD include:  Fatigue.  Muscle tension or having muscle twitches.  Trembling or feeling shaky.  Being easily startled.  Feeling like your heart is pounding or racing.  Feeling out of breath or like you cannot take a deep breath.  Having trouble falling asleep or staying asleep.  Sweating.  Nausea, diarrhea, or irritable bowel syndrome (IBS).  Headaches.  Trouble concentrating or remembering facts.  Restlessness.  Irritability. How is this diagnosed?  Your health care provider can diagnose GAD based on your symptoms and medical history. You will also have a physical exam. The health care provider will ask specific questions about your symptoms, including how severe they are, when they started, and if they come and go. Your health care provider may ask you about your use of alcohol or drugs, including prescription medicines. Your health care provider may refer you to a mental health specialist for further evaluation. Your health care provider will do a thorough examination and may perform additional tests to rule out other possible causes of your symptoms. To be diagnosed with GAD, a person must have anxiety that:  Is out of his or her control.  Affects several different aspects of his or her life, such as work and relationships.  Causes distress that makes him or her unable to take part in normal activities.  Includes at least three physical symptoms of GAD, such as restlessness, fatigue, trouble concentrating, irritability, muscle tension, or sleep problems. Before your health care provider can confirm a diagnosis of GAD, these symptoms must be present more days than they are not,  and they must last for six months or longer. How is this treated? The following therapies are usually used to treat GAD:  Medicine. Antidepressant medicine is usually prescribed for long-term daily control. Antianxiety medicines may be added in severe cases, especially when panic attacks occur.  Talk therapy (psychotherapy). Certain types of talk therapy can be helpful in treating GAD by providing support, education, and guidance. Options include: ? Cognitive behavioral therapy (CBT). People learn coping skills and techniques to ease their anxiety. They learn to identify unrealistic or negative thoughts and behaviors and to replace them with positive ones. ? Acceptance and commitment therapy (ACT). This treatment teaches people how to be mindful as a way to cope  with unwanted thoughts and feelings. ? Biofeedback. This process trains you to manage your body's response (physiological response) through breathing techniques and relaxation methods. You will work with a therapist while machines are used to monitor your physical symptoms.  Stress management techniques. These include yoga, meditation, and exercise. A mental health specialist can help determine which treatment is best for you. Some people see improvement with one type of therapy. However, other people require a combination of therapies. Follow these instructions at home:  Take over-the-counter and prescription medicines only as told by your health care provider.  Try to maintain a normal routine.  Try to anticipate stressful situations and allow extra time to manage them.  Practice any stress management or self-calming techniques as taught by your health care provider.  Do not punish yourself for setbacks or for not making progress.  Try to recognize your accomplishments, even if they are small.  Keep all follow-up visits as told by your health care provider. This is important. Contact a health care provider if:  Your symptoms do not get better.  Your symptoms get worse.  You have signs of depression, such as: ? A persistently sad, cranky, or irritable mood. ? Loss of enjoyment in activities that used to bring you joy. ? Change in weight or eating. ? Changes in sleeping habits. ? Avoiding friends or family members. ? Loss of energy for normal tasks. ? Feelings of guilt or worthlessness. Get help right away if:  You have serious thoughts about hurting yourself or others. If you ever feel like you may hurt yourself or others, or have thoughts about taking your own life, get help right away. You can go to your nearest emergency department or call:  Your local emergency services (911 in the [place]  A suicide crisis helpline, such as the [place] at  843-301-5739. This is open 24 hours a day. Summary  Generalized anxiety disorder (GAD) is a mental health disorder that involves worry that is not triggered by a specific event.  People with GAD often worry excessively about many things in their lives, such as their health and family.  GAD may cause physical symptoms such as restlessness, trouble concentrating, sleep problems, frequent sweating, nausea, diarrhea, headaches, and trembling or muscle twitching.  A mental health specialist can help determine which treatment is best for you. Some people see improvement with one type of therapy. However, other people require a combination of therapies. This information is not intended to replace advice given to you by your health care provider. Make sure you discuss any questions you have with your health care provider. Document Released: 09/18/2012 Document Revised: 05/06/2017 Document Reviewed: 04/13/2016 Elsevier Patient Education  2020 [employer].

## 2019-01-04 ENCOUNTER — Other Ambulatory Visit: Payer: Self-pay

## 2019-01-04 ENCOUNTER — Other Ambulatory Visit: Payer: Medicare Other

## 2019-01-04 ENCOUNTER — Encounter (INDEPENDENT_AMBULATORY_CARE_PROVIDER_SITE_OTHER): Payer: Self-pay

## 2019-01-04 DIAGNOSIS — K219 Gastro-esophageal reflux disease without esophagitis: Secondary | ICD-10-CM

## 2019-01-04 DIAGNOSIS — E785 Hyperlipidemia, unspecified: Secondary | ICD-10-CM

## 2019-01-04 DIAGNOSIS — N3281 Overactive bladder: Secondary | ICD-10-CM

## 2019-01-04 DIAGNOSIS — E1169 Type 2 diabetes mellitus with other specified complication: Secondary | ICD-10-CM

## 2019-01-04 DIAGNOSIS — E039 Hypothyroidism, unspecified: Secondary | ICD-10-CM

## 2019-01-04 DIAGNOSIS — F411 Generalized anxiety disorder: Secondary | ICD-10-CM

## 2019-01-04 DIAGNOSIS — I10 Essential (primary) hypertension: Secondary | ICD-10-CM

## 2019-01-04 DIAGNOSIS — M62838 Other muscle spasm: Secondary | ICD-10-CM

## 2019-01-04 DIAGNOSIS — J302 Other seasonal allergic rhinitis: Secondary | ICD-10-CM

## 2019-01-04 LAB — BAYER DCA HB A1C WAIVED: HB A1C (BAYER DCA - WAIVED): 6 % (ref <7.0)

## 2019-01-05 LAB — CMP14+EGFR
ALT: 18 IU/L (ref 0–32)
AST: 19 IU/L (ref 0–40)
Albumin/Globulin Ratio: 1.4 (ref 1.2–2.2)
Albumin: 4.4 g/dL (ref 3.7–4.7)
Alkaline Phosphatase: 94 IU/L (ref 39–117)
BUN/Creatinine Ratio: 30 — ABNORMAL HIGH (ref 12–28)
BUN: 25 mg/dL (ref 8–27)
Bilirubin Total: 0.2 mg/dL (ref 0.0–1.2)
CO2: 21 mmol/L (ref 20–29)
Calcium: 9.5 mg/dL (ref 8.7–10.3)
Chloride: 102 mmol/L (ref 96–106)
Creatinine, Ser: 0.84 mg/dL (ref 0.57–1.00)
GFR calc Af Amer: 80 mL/min/{1.73_m2} (ref >59)
GFR calc non Af Amer: 70 mL/min/{1.73_m2} (ref >59)
Globulin, Total: 3.1 g/dL (ref 1.5–4.5)
Glucose: 110 mg/dL — ABNORMAL HIGH (ref 65–99)
Potassium: 4 mmol/L (ref 3.5–5.2)
Sodium: 142 mmol/L (ref 134–144)
Total Protein: 7.5 g/dL (ref 6.0–8.5)

## 2019-01-05 LAB — MICROALBUMIN / CREATININE URINE RATIO
Creatinine, Urine: 205 mg/dL
Microalb/Creat Ratio: 7 mg/g creat (ref 0–29)
Microalbumin, Urine: 13.9 ug/mL

## 2019-01-05 LAB — MAGNESIUM: Magnesium: 2 mg/dL (ref 1.6–2.3)

## 2019-03-12 ENCOUNTER — Telehealth: Payer: Self-pay | Admitting: Family Medicine

## 2019-03-12 ENCOUNTER — Other Ambulatory Visit: Payer: Self-pay | Admitting: Family Medicine

## 2019-03-12 NOTE — Telephone Encounter (Signed)
Patient has a follow up tele visit appointment scheduled. 

## 2019-03-13 ENCOUNTER — Ambulatory Visit (INDEPENDENT_AMBULATORY_CARE_PROVIDER_SITE_OTHER): Payer: Medicare Other | Admitting: Family Medicine

## 2019-03-13 ENCOUNTER — Encounter: Payer: Self-pay | Admitting: Family Medicine

## 2019-03-13 ENCOUNTER — Other Ambulatory Visit: Payer: Self-pay | Admitting: Family Medicine

## 2019-03-13 DIAGNOSIS — F411 Generalized anxiety disorder: Secondary | ICD-10-CM | POA: Diagnosis not present

## 2019-03-13 MED ORDER — LOSARTAN POTASSIUM-HCTZ 100-25 MG PO TABS: 1.0000 | ORAL_TABLET | Freq: Every day | ORAL | 1 refills | Status: DC

## 2019-03-13 MED ORDER — SERTRALINE HCL 25 MG PO TABS: 37.5000 mg | ORAL_TABLET | Freq: Every day | ORAL | 2 refills | Status: DC

## 2019-03-13 NOTE — Progress Notes (Signed)
Virtual Visit via Telephone Note  I connected with Desiree Bowers on 03/13/19 at 8:04 AM by telephone and verified that I am speaking with the correct person using two identifiers. Desiree Bowers is currently located at a friend's house and nobody is currently with her during this visit. The provider, Loman Brooklyn, FNP is located in their home at time of visit.  I discussed the limitations, risks, security and privacy concerns of performing an evaluation and management service by telephone and the availability of in person appointments. I also discussed with the patient that there may be a patient responsible charge related to this service. The patient expressed understanding and agreed to proceed.  Subjective: PCP: Loman Brooklyn, FNP  Chief Complaint  Patient presents with  . Anxiety   Patient was decreased from Xanax 0.5 mg twice daily to 0.5 mg in the AM and 0.25 in the PM and Zoloft was started at 25 mg daily on 12/21/2018. She was to follow-up in two months to continue weaning and assess Zoloft. Today she reports she has not taken any Xanax since July. She feels the Zoloft is working well for her but would like to increase her dosage as she does not feel it is lasting all day.    ROS: Per HPI  Current Outpatient Medications:  .  calcium carbonate (OSCAL) 1500 (600 Ca) MG TABS tablet, Take 2 tablets by mouth daily. , Disp: , Rfl:  .  cetirizine (ZYRTEC) 10 MG tablet, Take 1 tablet (10 mg total) by mouth daily., Disp: 90 tablet, Rfl: 1 .  fluticasone (FLONASE) 50 MCG/ACT nasal spray, Place 2 sprays into both nostrils daily., Disp: 16 g, Rfl: 2 .  furosemide (LASIX) 20 MG tablet, Take 20 mg by mouth daily as needed for edema. , Disp: , Rfl:  .  losartan-hydrochlorothiazide (HYZAAR) 100-25 MG tablet, Take 1 tablet by mouth daily. , Disp: , Rfl:  .  magnesium oxide (MAG-OX) 400 (241.3 Mg) MG tablet, Take 400 mg by mouth daily. , Disp: , Rfl:  .  meloxicam (MOBIC) 7.5 MG tablet, Take  7.5 mg by mouth daily. , Disp: , Rfl:  .  methocarbamol (ROBAXIN) 500 MG tablet, Take 1-2 tablets (500-1,000 mg total) by mouth every 8 (eight) hours as needed for muscle spasms., Disp: 30 tablet, Rfl: 2 .  omeprazole (PRILOSEC) 20 MG capsule, Take 1 capsule (20 mg total) by mouth daily., Disp: 90 capsule, Rfl: 1 .  oxybutynin (DITROPAN-XL) 10 MG 24 hr tablet, Take 10 mg by mouth daily. , Disp: , Rfl:  .  sertraline (ZOLOFT) 25 MG tablet, Take 1 tablet (25 mg total) by mouth daily., Disp: 30 tablet, Rfl: 2 .  SYNTHROID 112 MCG tablet, Take 112 mcg by mouth daily before breakfast. , Disp: , Rfl:   Allergies  Allergen Reactions  . Penicillins Rash  . Gabapentin Other (See Comments)    Couldn't move legs during the night after taking.   Past Medical History:  Diagnosis Date  . Coronary artery disease due to lipid rich plaque 04/13/2016  . Diabetes mellitus without complication ([place])   . Diverticulosis of intestine without bleeding 12/13/2016  . History of right mastectomy 11/15/2014  . Hypothyroidism   . Paget's disease of the bone   . Paroxysmal atrial fibrillation (H[facility]) 06/19/2014    Observations/Objective: A&O  No respiratory distress or wheezing audible over the phone Mood, judgement, and thought processes all WNL  Assessment and Plan: 1. GAD (generalized anxiety disorder) -  Zoloft increased from 25 mg to 37.5 mg PO QD. Patient did not wish to increase to 50 mg just yet.  - sertraline (ZOLOFT) 25 MG tablet; Take 1.5 tablets (37.5 mg total) by mouth daily.  Dispense: 45 tablet; Refill: 2   Follow Up Instructions:  I discussed the assessment and treatment plan with the patient. The patient was provided an opportunity to ask questions and all were answered. The patient agreed with the plan and demonstrated an understanding of the instructions.   The patient was advised to call back or seek an in-person evaluation if the symptoms worsen or if the condition fails to improve as  anticipated.  The above assessment and management plan was discussed with the patient. The patient verbalized understanding of and has agreed to the management plan. Patient is aware to call the clinic if symptoms persist or worsen. Patient is aware when to return to the clinic for a follow-up visit. Patient educated on when it is appropriate to go to the emergency department.   Time call ended: 8:14 AM  I provided 12 minutes of non-face-to-face time during this encounter.  Hendricks Limes, MSN, APRN, FNP-C [facility] 03/13/19

## 2019-04-24 ENCOUNTER — Other Ambulatory Visit: Payer: Self-pay

## 2019-04-25 NOTE — Progress Notes (Signed)
Assessment & Plan:  1. GAD (generalized anxiety disorder) - Well controlled on current regimen.   2. Hordeolum externum of left lower eyelid - Encouraged to cleanse with Johnson's baby soap twice daily and apply warm compresses throughout the day.  3. Essential (primary) hypertension - Well controlled on current regimen.   4. Gastroesophageal reflux disease without esophagitis - Patient is not taking any medication for acid reflux but reports she is doing well.  5. Muscle spasm - Patient was confused about her muscle relaxers. She is taking Flexeril QHS but didn't understand about Robaxin. I educated her that she can take Robaxin during the day if needed as it will not cause drowsiness like the Flexeril. She verbalizes understanding.  - methocarbamol (ROBAXIN) 500 MG tablet; Take 1-2 tablets (500-1,000 mg total) by mouth every 8 (eight) hours as needed for muscle spasms.  Dispense: 30 tablet; Refill: 2   Return in about 6 months (around 10/24/2019) for follow-up of chronic medication conditions.  Hendricks Limes, MSN, APRN, FNP-C [facility]  Subjective:    Patient ID: Desiree Bowers, female    DOB: 04-06-1946, 73 y.o.   MRN: [id number]  Patient Care Team: Loman Brooklyn, FNP as PCP - General (Family Medicine) Delrae Rend, MD as Consulting Physician (Endocrinology)   Chief Complaint:  Chief Complaint  Patient presents with  . Anxiety    6 week follow up     HPI: Desiree Bowers is a 73 y.o. female presenting on 04/26/2019 for Anxiety (6 week follow up )  Patient here for follow-up of anxiety.  Her sertraline was increased from 25 to 37.5 mg daily 6 weeks ago.  Today she reports she is happy with her current dosage.   GAD 7 : Generalized Anxiety Score 04/26/2019 12/21/2018  Nervous, Anxious, on Edge 1 1  Control/stop worrying 0 0  Worry too much - different things 0 0  Trouble relaxing 0 0  Restless 1 0  Easily annoyed or irritable 0 0  Afraid  - awful might happen 0 2  Total GAD 7 Score 2 3  Anxiety Difficulty Not difficult at all -    New complaints: Patient reports she has a bump on her left eye that just popped up two days ago.   Social history:  Relevant past medical, surgical, family and social history reviewed and updated as indicated. Interim medical history since our last visit reviewed.  Allergies and medications reviewed and updated.  DATA REVIEWED: CHART IN EPIC  ROS: Negative unless specifically indicated above in HPI.    Current Outpatient Medications:  .  calcium carbonate (OSCAL) 1500 (600 Ca) MG TABS tablet, Take 2 tablets by mouth daily. , Disp: , Rfl:  .  cetirizine (ZYRTEC) 10 MG tablet, Take 1 tablet (10 mg total) by mouth daily., Disp: 90 tablet, Rfl: 1 .  cyclobenzaprine (FLEXERIL) 10 MG tablet, Take 10 mg by mouth 3 (three) times daily as needed for muscle spasms., Disp: , Rfl:  .  furosemide (LASIX) 20 MG tablet, Take 20 mg by mouth daily as needed for edema. , Disp: , Rfl:  .  losartan-hydrochlorothiazide (HYZAAR) 100-25 MG tablet, Take 1 tablet by mouth daily., Disp: 90 tablet, Rfl: 1 .  magnesium oxide (MAG-OX) 400 (241.3 Mg) MG tablet, Take 400 mg by mouth daily. , Disp: , Rfl:  .  meloxicam (MOBIC) 7.5 MG tablet, Take 7.5 mg by mouth daily. , Disp: , Rfl:  .  oxybutynin (DITROPAN-XL) 10 MG 24  hr tablet, Take 10 mg by mouth daily. , Disp: , Rfl:  .  sertraline (ZOLOFT) 25 MG tablet, Take 1.5 tablets (37.5 mg total) by mouth daily., Disp: 45 tablet, Rfl: 2 .  SYNTHROID 112 MCG tablet, Take 112 mcg by mouth daily before breakfast. , Disp: , Rfl:  .  methocarbamol (ROBAXIN) 500 MG tablet, Take 1-2 tablets (500-1,000 mg total) by mouth every 8 (eight) hours as needed for muscle spasms., Disp: 30 tablet, Rfl: 2   Allergies  Allergen Reactions  . Penicillins Rash  . Gabapentin Other (See Comments)    Couldn't move legs during the night after taking.   Past Medical History:  Diagnosis Date  .  Coronary artery disease due to lipid rich plaque 04/13/2016  . Diabetes mellitus without complication ([place])   . Diverticulosis of intestine without bleeding 12/13/2016  . History of right mastectomy 11/15/2014  . Hypothyroidism   . Paget's disease of the bone   . Paroxysmal atrial fibrillation ([place]) 06/19/2014    Past Surgical History:  Procedure Laterality Date  . ABDOMINAL HYSTERECTOMY  1999  . APPENDECTOMY     as a child  . CATARACT EXTRACTION, BILATERAL    . MASTECTOMY Right     Social History   Socioeconomic History  . Marital status: Married    Spouse name: Not on file  . Number of children: Not on file  . Years of education: Not on file  . Highest education level: Not on file  Occupational History  . Not on file  Social Needs  . Financial resource strain: Not on file  . Food insecurity    Worry: Not on file    Inability: Not on file  . Transportation needs    Medical: Not on file    Non-medical: Not on file  Tobacco Use  . Smoking status: Former Research scientist (life sciences)  . Smokeless tobacco: Never Used  Substance and Sexual Activity  . Alcohol use: No    Frequency: Never  . Drug use: No  . Sexual activity: Not on file  Lifestyle  . Physical activity    Days per week: Not on file    Minutes per session: Not on file  . Stress: Not on file  Relationships  . Social Herbalist on phone: Not on file    Gets together: Not on file    Attends religious service: Not on file    Active member of club or organization: Not on file    Attends meetings of clubs or organizations: Not on file    Relationship status: Not on file  . Intimate partner violence    Fear of current or ex partner: Not on file    Emotionally abused: Not on file    Physically abused: Not on file    Forced sexual activity: Not on file  Other Topics Concern  . Not on file  Social History Narrative  . Not on file        Objective:    BP 132/80   Pulse 100   Temp (!) 96 F (35.6 C) (Temporal)   Ht  5\' 5"  (1.651 m)   Wt 173 lb 12.8 oz (78.8 kg)   SpO2 97%   BMI 28.92 kg/m   Physical Exam Vitals signs reviewed.  Constitutional:      General: She is not in acute distress.    Appearance: Normal appearance. She is overweight. She is not ill-appearing, toxic-appearing or diaphoretic.  HENT:  Head: Normocephalic and atraumatic.  Eyes:     General: No scleral icterus.       Right eye: No discharge.        Left eye: No discharge.     Conjunctiva/sclera: Conjunctivae normal.  Neck:     Musculoskeletal: Normal range of motion.  Cardiovascular:     Rate and Rhythm: Normal rate and regular rhythm.     Heart sounds: Normal heart sounds. No murmur. No friction rub. No gallop.   Pulmonary:     Effort: Pulmonary effort is normal. No respiratory distress.     Breath sounds: Normal breath sounds. No stridor. No wheezing, rhonchi or rales.  Musculoskeletal: Normal range of motion.  Skin:    General: Skin is warm and dry.     Capillary Refill: Capillary refill takes less than 2 seconds.  Neurological:     General: No focal deficit present.     Mental Status: She is alert and oriented to person, place, and time. Mental status is at baseline.  Psychiatric:        Mood and Affect: Mood normal.        Behavior: Behavior normal.        Thought Content: Thought content normal.        Judgment: Judgment normal.    Diabetic Foot Exam - Simple   Simple Foot Form Diabetic Foot exam was performed with the following findings: Yes 04/26/2019  8:46 AM  Visual Inspection No deformities, no ulcerations, no other skin breakdown bilaterally: Yes Sensation Testing Intact to touch and monofilament testing bilaterally: Yes Pulse Check Posterior Tibialis and Dorsalis pulse intact bilaterally: Yes Comments    No results found for: TSH No results found for: WBC, HGB, HCT, MCV, PLT Lab Results  Component Value Date   NA 142 01/04/2019   K 4.0 01/04/2019   CO2 21 01/04/2019   GLUCOSE 110 (H)  01/04/2019   BUN 25 01/04/2019   CREATININE 0.84 01/04/2019   BILITOT 0.2 01/04/2019   ALKPHOS 94 01/04/2019   AST 19 01/04/2019   ALT 18 01/04/2019   PROT 7.5 01/04/2019   ALBUMIN 4.4 01/04/2019   CALCIUM 9.5 01/04/2019   No results found for: CHOL No results found for: HDL No results found for: LDLCALC No results found for: TRIG No results found for: [facility] Lab Results  Component Value Date   HGBA1C 6.0 01/04/2019

## 2019-04-26 ENCOUNTER — Encounter: Payer: Self-pay | Admitting: Family Medicine

## 2019-04-26 ENCOUNTER — Other Ambulatory Visit: Payer: Self-pay

## 2019-04-26 ENCOUNTER — Ambulatory Visit (INDEPENDENT_AMBULATORY_CARE_PROVIDER_SITE_OTHER): Payer: Medicare Other | Admitting: Family Medicine

## 2019-04-26 VITALS — BP 132/80 | HR 100 | Temp 96.0°F | Ht 65.0 in | Wt 173.8 lb

## 2019-04-26 DIAGNOSIS — H00015 Hordeolum externum left lower eyelid: Secondary | ICD-10-CM

## 2019-04-26 DIAGNOSIS — I10 Essential (primary) hypertension: Secondary | ICD-10-CM | POA: Diagnosis not present

## 2019-04-26 DIAGNOSIS — K219 Gastro-esophageal reflux disease without esophagitis: Secondary | ICD-10-CM | POA: Diagnosis not present

## 2019-04-26 DIAGNOSIS — F411 Generalized anxiety disorder: Secondary | ICD-10-CM

## 2019-04-26 DIAGNOSIS — M62838 Other muscle spasm: Secondary | ICD-10-CM

## 2019-04-26 MED ORDER — METHOCARBAMOL 500 MG PO TABS: 500.0000 mg | ORAL_TABLET | Freq: Three times a day (TID) | ORAL | 2 refills | Status: DC | PRN

## 2019-04-26 NOTE — Patient Instructions (Signed)
Schedule appointment for diabetic eye exam.

## 2019-05-30 ENCOUNTER — Other Ambulatory Visit: Payer: Self-pay | Admitting: *Deleted

## 2019-05-30 DIAGNOSIS — F411 Generalized anxiety disorder: Secondary | ICD-10-CM

## 2019-05-30 MED ORDER — SERTRALINE HCL 25 MG PO TABS: 37.5000 mg | ORAL_TABLET | Freq: Every day | ORAL | 1 refills | Status: DC

## 2019-06-21 ENCOUNTER — Telehealth: Payer: Self-pay | Admitting: Family Medicine

## 2019-06-21 NOTE — Telephone Encounter (Signed)
televisit made for 06/22/19. Pt aware

## 2019-06-21 NOTE — Telephone Encounter (Signed)
Can we schedule her for a telephone visit simply because when I spoke to her in November she was very happy with the current medication and dosage?  Desiree Bowers

## 2019-06-22 ENCOUNTER — Encounter: Payer: Self-pay | Admitting: Family Medicine

## 2019-06-22 ENCOUNTER — Ambulatory Visit (INDEPENDENT_AMBULATORY_CARE_PROVIDER_SITE_OTHER): Payer: Medicare Other | Admitting: Family Medicine

## 2019-06-22 DIAGNOSIS — G2581 Restless legs syndrome: Secondary | ICD-10-CM | POA: Diagnosis not present

## 2019-06-22 DIAGNOSIS — F411 Generalized anxiety disorder: Secondary | ICD-10-CM | POA: Diagnosis not present

## 2019-06-22 MED ORDER — PRAMIPEXOLE DIHYDROCHLORIDE 0.125 MG PO TABS: 0.1250 mg | ORAL_TABLET | Freq: Every day | ORAL | 0 refills | Status: DC

## 2019-06-22 NOTE — Progress Notes (Signed)
Virtual Visit via Telephone Note  I connected with Desiree Bowers on 06/22/19 at 12:09 PM by telephone and verified that I am speaking with the correct person using two identifiers. Desiree Bowers is currently located at home and nobody is currently with her during this visit. The provider, Loman Brooklyn, FNP is located in their office at time of visit.  I discussed the limitations, risks, security and privacy concerns of performing an evaluation and management service by telephone and the availability of in person appointments. I also discussed with the patient that there may be a patient responsible charge related to this service. The patient expressed understanding and agreed to proceed.  Subjective: PCP: Loman Brooklyn, FNP  Chief Complaint  Patient presents with  . Leg Problem   Patient reports she has been having a lot of trouble with her restless legs. She has tried taking some leftover Xanax that she had but states she does not want to be on this due to its addictive nature and how hard it was to previously get off of it. Previously unable to tolerate gabapentin.   She reports her anxiety is gone and she is going to stop the sertraline.    ROS: Per HPI  Current Outpatient Medications:  .  calcium carbonate (OSCAL) 1500 (600 Ca) MG TABS tablet, Take 2 tablets by mouth daily. , Disp: , Rfl:  .  cetirizine (ZYRTEC) 10 MG tablet, Take 1 tablet (10 mg total) by mouth daily., Disp: 90 tablet, Rfl: 1 .  cyclobenzaprine (FLEXERIL) 10 MG tablet, Take 10 mg by mouth 3 (three) times daily as needed for muscle spasms., Disp: , Rfl:  .  furosemide (LASIX) 20 MG tablet, Take 20 mg by mouth daily as needed for edema. , Disp: , Rfl:  .  losartan-hydrochlorothiazide (HYZAAR) 100-25 MG tablet, Take 1 tablet by mouth daily., Disp: 90 tablet, Rfl: 1 .  magnesium oxide (MAG-OX) 400 (241.3 Mg) MG tablet, Take 400 mg by mouth daily. , Disp: , Rfl:  .  meloxicam (MOBIC) 7.5 MG tablet, Take 7.5 mg  by mouth daily. , Disp: , Rfl:  .  methocarbamol (ROBAXIN) 500 MG tablet, Take 1-2 tablets (500-1,000 mg total) by mouth every 8 (eight) hours as needed for muscle spasms., Disp: 30 tablet, Rfl: 2 .  oxybutynin (DITROPAN-XL) 10 MG 24 hr tablet, Take 10 mg by mouth daily. , Disp: , Rfl:  .  pramipexole (MIRAPEX) 0.125 MG tablet, Take 1 tablet (0.125 mg total) by mouth at bedtime. May increase to 0.25 (2 tablets) after 1 week if needed., Disp: 30 tablet, Rfl: 0 .  SYNTHROID 112 MCG tablet, Take 112 mcg by mouth daily before breakfast. , Disp: , Rfl:   Allergies  Allergen Reactions  . Penicillins Rash  . Gabapentin Other (See Comments)    Couldn't move legs during the night after taking.   Past Medical History:  Diagnosis Date  . Coronary artery disease due to lipid rich plaque 04/13/2016  . Diabetes mellitus without complication ([place])   . Diverticulosis of intestine without bleeding 12/13/2016  . History of right mastectomy 11/15/2014  . Hypothyroidism   . Paget's disease of the bone   . Paroxysmal atrial fibrillation ([facility]) 06/19/2014    Observations/Objective: A&O  No respiratory distress or wheezing audible over the phone Mood, judgement, and thought processes all WNL  Assessment and Plan: 1. Restless leg syndrome - Patient to start Mirapex. We will f/u in 2 weeks to see what dosage  she is on and if she needs to be increased further.  - pramipexole (MIRAPEX) 0.125 MG tablet; Take 1 tablet (0.125 mg total) by mouth at bedtime. May increase to 0.25 (2 tablets) after 1 week if needed.  Dispense: 30 tablet; Refill: 0  2. GAD (generalized anxiety disorder) - Controlled. Patient is going to stop taking the sertraline. Advised to let me know if anxiety worsens/returns and she needs to go back on it.   Follow Up Instructions:  Return in about 2 weeks (around 07/06/2019) for RLS (telephone visit).  I discussed the assessment and treatment plan with the patient. The patient was provided an  opportunity to ask questions and all were answered. The patient agreed with the plan and demonstrated an understanding of the instructions.   The patient was advised to call back or seek an in-person evaluation if the symptoms worsen or if the condition fails to improve as anticipated.  The above assessment and management plan was discussed with the patient. The patient verbalized understanding of and has agreed to the management plan. Patient is aware to call the clinic if symptoms persist or worsen. Patient is aware when to return to the clinic for a follow-up visit. Patient educated on when it is appropriate to go to the emergency department.   Time call ended: 12:21 PM  I provided 14 minutes of non-face-to-face time during this encounter.  Hendricks Limes, MSN, APRN, FNP-C [facility] 06/22/19

## 2019-07-06 ENCOUNTER — Encounter: Payer: Self-pay | Admitting: Family Medicine

## 2019-07-06 ENCOUNTER — Ambulatory Visit (INDEPENDENT_AMBULATORY_CARE_PROVIDER_SITE_OTHER): Payer: Medicare Other | Admitting: Family Medicine

## 2019-07-06 DIAGNOSIS — F411 Generalized anxiety disorder: Secondary | ICD-10-CM | POA: Diagnosis not present

## 2019-07-06 DIAGNOSIS — G2581 Restless legs syndrome: Secondary | ICD-10-CM

## 2019-07-06 MED ORDER — HYDROXYZINE PAMOATE 25 MG PO CAPS: 25.0000 mg | ORAL_CAPSULE | Freq: Three times a day (TID) | ORAL | 2 refills | Status: DC | PRN

## 2019-07-06 MED ORDER — PRAMIPEXOLE DIHYDROCHLORIDE 0.125 MG PO TABS: 0.1250 mg | ORAL_TABLET | Freq: Every day | ORAL | 2 refills | Status: DC

## 2019-07-06 NOTE — Progress Notes (Signed)
Virtual Visit via Telephone Note  I connected with Desiree Bowers on 07/06/19 at 11:28 AM by telephone and verified that I am speaking with the correct person using two identifiers. Desiree Bowers is currently located at home and nobody is currently with her during this visit. The provider, Loman Brooklyn, FNP is located in their office at time of visit.  I discussed the limitations, risks, security and privacy concerns of performing an evaluation and management service by telephone and the availability of in person appointments. I also discussed with the patient that there may be a patient responsible charge related to this service. The patient expressed understanding and agreed to proceed.  Subjective: PCP: Loman Brooklyn, FNP  Chief Complaint  Patient presents with  . Follow-up    RLS   Patient is following up on her RLS. She was started on Mirapex 0.125 mg at bedtime two weeks ago with instructions that she may increase to 0.25 mg after one week if needed.  Patient reports today she did not need to increase to the two tablets that her legs have been perfectly still just taking the 0.125 mg at bedtime.  She is very pleased with the results of this medication.  She does report she feels like she may need something on an as-needed basis for her anxiety.  She had an episode at church last weekend where she felt slightly short of breath and felt it was related to anxiety.  She took a sertraline at that time and is not sure if it helped or not.  ROS: Per HPI  Current Outpatient Medications:  .  calcium carbonate (OSCAL) 1500 (600 Ca) MG TABS tablet, Take 2 tablets by mouth daily. , Disp: , Rfl:  .  cetirizine (ZYRTEC) 10 MG tablet, Take 1 tablet (10 mg total) by mouth daily., Disp: 90 tablet, Rfl: 1 .  cyclobenzaprine (FLEXERIL) 10 MG tablet, Take 10 mg by mouth 3 (three) times daily as needed for muscle spasms., Disp: , Rfl:  .  furosemide (LASIX) 20 MG tablet, Take 20 mg by mouth daily  as needed for edema. , Disp: , Rfl:  .  losartan-hydrochlorothiazide (HYZAAR) 100-25 MG tablet, Take 1 tablet by mouth daily., Disp: 90 tablet, Rfl: 1 .  magnesium oxide (MAG-OX) 400 (241.3 Mg) MG tablet, Take 400 mg by mouth daily. , Disp: , Rfl:  .  meloxicam (MOBIC) 7.5 MG tablet, Take 7.5 mg by mouth daily. , Disp: , Rfl:  .  methocarbamol (ROBAXIN) 500 MG tablet, Take 1-2 tablets (500-1,000 mg total) by mouth every 8 (eight) hours as needed for muscle spasms., Disp: 30 tablet, Rfl: 2 .  oxybutynin (DITROPAN-XL) 10 MG 24 hr tablet, Take 10 mg by mouth daily. , Disp: , Rfl:  .  pramipexole (MIRAPEX) 0.125 MG tablet, Take 1 tablet (0.125 mg total) by mouth at bedtime. May increase to 0.25 (2 tablets) after 1 week if needed., Disp: 30 tablet, Rfl: 0 .  SYNTHROID 112 MCG tablet, Take 112 mcg by mouth daily before breakfast. , Disp: , Rfl:   Allergies  Allergen Reactions  . Penicillins Rash  . Gabapentin Other (See Comments)    Couldn't move legs during the night after taking.   Past Medical History:  Diagnosis Date  . Coronary artery disease due to lipid rich plaque 04/13/2016  . Diabetes mellitus without complication ([place])   . Diverticulosis of intestine without bleeding 12/13/2016  . History of right mastectomy 11/15/2014  . Hypothyroidism   .  Paget's disease of the bone   . Paroxysmal atrial fibrillation ([facility]) 06/19/2014    Observations/Objective: A&O  No respiratory distress or wheezing audible over the phone Mood, judgement, and thought processes all WNL  Assessment and Plan: 1. Restless leg syndrome - Well controlled on current regimen.  - pramipexole (MIRAPEX) 0.125 MG tablet; Take 1 tablet (0.125 mg total) by mouth at bedtime.  Dispense: 90 tablet; Refill: 2  2. GAD (generalized anxiety disorder) - Discussed with patient that sertraline is not going to be a good medication for her to take on an as-needed basis.  I am sending her in hydroxyzine 25 mg that she may take as  needed. - hydrOXYzine (VISTARIL) 25 MG capsule; Take 1 capsule (25 mg total) by mouth 3 (three) times daily as needed for anxiety.  Dispense: 30 capsule; Refill: 2   Follow Up Instructions:  Return as scheduled.   I discussed the assessment and treatment plan with the patient. The patient was provided an opportunity to ask questions and all were answered. The patient agreed with the plan and demonstrated an understanding of the instructions.   The patient was advised to call back or seek an in-person evaluation if the symptoms worsen or if the condition fails to improve as anticipated.  The above assessment and management plan was discussed with the patient. The patient verbalized understanding of and has agreed to the management plan. Patient is aware to call the clinic if symptoms persist or worsen. Patient is aware when to return to the clinic for a follow-up visit. Patient educated on when it is appropriate to go to the emergency department.   Time call ended: 11:37 AM  I provided 11 minutes of non-face-to-face time during this encounter.  Hendricks Limes, MSN, APRN, FNP-C [facility] 07/06/19

## 2019-07-10 ENCOUNTER — Encounter (HOSPITAL_COMMUNITY): Payer: Self-pay | Admitting: Emergency Medicine

## 2019-07-10 ENCOUNTER — Emergency Department (HOSPITAL_COMMUNITY): Payer: Medicare Other

## 2019-07-10 ENCOUNTER — Emergency Department (HOSPITAL_COMMUNITY)
Admission: EM | Admit: 2019-07-10 | Discharge: 2019-07-10 | Disposition: A | Discharge status: Home / Self Care | Payer: Medicare Other | Attending: Emergency Medicine | Admitting: Emergency Medicine

## 2019-07-10 ENCOUNTER — Other Ambulatory Visit: Payer: Self-pay

## 2019-07-10 DIAGNOSIS — Z87891 Personal history of nicotine dependence: Secondary | ICD-10-CM | POA: Diagnosis not present

## 2019-07-10 DIAGNOSIS — E119 Type 2 diabetes mellitus without complications: Secondary | ICD-10-CM | POA: Diagnosis not present

## 2019-07-10 DIAGNOSIS — E039 Hypothyroidism, unspecified: Secondary | ICD-10-CM | POA: Insufficient documentation

## 2019-07-10 DIAGNOSIS — R531 Weakness: Secondary | ICD-10-CM | POA: Diagnosis not present

## 2019-07-10 DIAGNOSIS — Z79899 Other long term (current) drug therapy: Secondary | ICD-10-CM | POA: Diagnosis not present

## 2019-07-10 DIAGNOSIS — I2510 Atherosclerotic heart disease of native coronary artery without angina pectoris: Secondary | ICD-10-CM | POA: Diagnosis not present

## 2019-07-10 DIAGNOSIS — R42 Dizziness and giddiness: Secondary | ICD-10-CM | POA: Insufficient documentation

## 2019-07-10 DIAGNOSIS — R918 Other nonspecific abnormal finding of lung field: Secondary | ICD-10-CM | POA: Insufficient documentation

## 2019-07-10 DIAGNOSIS — R55 Syncope and collapse: Secondary | ICD-10-CM | POA: Diagnosis present

## 2019-07-10 LAB — CBC WITH DIFFERENTIAL/PLATELET
Abs Immature Granulocytes: 0.02 10*3/uL (ref 0.00–0.07)
Basophils Absolute: 0 10*3/uL (ref 0.0–0.1)
Basophils Relative: 1 %
Eosinophils Absolute: 0 10*3/uL (ref 0.0–0.5)
Eosinophils Relative: 1 %
HCT: 36 % (ref 36.0–46.0)
Hemoglobin: 11.2 g/dL — ABNORMAL LOW (ref 12.0–15.0)
Immature Granulocytes: 0 %
Lymphocytes Relative: 37 %
Lymphs Abs: 1.9 10*3/uL (ref 0.7–4.0)
MCH: 26.1 pg (ref 26.0–34.0)
MCHC: 31.1 g/dL (ref 30.0–36.0)
MCV: 83.9 fL (ref 80.0–100.0)
Monocytes Absolute: 0.5 10*3/uL (ref 0.1–1.0)
Monocytes Relative: 9 %
Neutro Abs: 2.6 10*3/uL (ref 1.7–7.7)
Neutrophils Relative %: 52 %
Platelets: 356 10*3/uL (ref 150–400)
RBC: 4.29 MIL/uL (ref 3.87–5.11)
RDW: 14.7 % (ref 11.5–15.5)
WBC: 5 10*3/uL (ref 4.0–10.5)
nRBC: 0 % (ref 0.0–0.2)

## 2019-07-10 LAB — COMPREHENSIVE METABOLIC PANEL
ALT: 19 U/L (ref 0–44)
AST: 29 U/L (ref 15–41)
Albumin: 3.7 g/dL (ref 3.5–5.0)
Alkaline Phosphatase: 82 U/L (ref 38–126)
Anion gap: 9 (ref 5–15)
BUN: 25 mg/dL — ABNORMAL HIGH (ref 8–23)
CO2: 25 mmol/L (ref 22–32)
Calcium: 9.1 mg/dL (ref 8.9–10.3)
Chloride: 103 mmol/L (ref 98–111)
Creatinine, Ser: 0.92 mg/dL (ref 0.44–1.00)
GFR calc Af Amer: >60 mL/min (ref >60)
GFR calc non Af Amer: >60 mL/min (ref >60)
Glucose, Bld: 120 mg/dL — ABNORMAL HIGH (ref 70–99)
Potassium: 4.1 mmol/L (ref 3.5–5.1)
Sodium: 137 mmol/L (ref 135–145)
Total Bilirubin: 0.4 mg/dL (ref 0.3–1.2)
Total Protein: 7.3 g/dL (ref 6.5–8.1)

## 2019-07-10 LAB — TROPONIN I (HIGH SENSITIVITY)
Troponin I (High Sensitivity): 6 ng/L (ref <18)
Troponin I (High Sensitivity): 7 ng/L (ref <18)

## 2019-07-10 MED ORDER — SODIUM CHLORIDE 0.9 % IV BOLUS
1000.0000 mL | Freq: Once | INTRAVENOUS | Status: AC
  Administered 2019-07-10: 10:00:00 1000 mL via INTRAVENOUS

## 2019-07-10 MED ORDER — ONDANSETRON 4 MG PO TBDP: 4.0000 mg | ORAL_TABLET | Freq: Once | ORAL | Status: DC

## 2019-07-10 MED ORDER — IOHEXOL 300 MG/ML  SOLN
75.0000 mL | Freq: Once | INTRAMUSCULAR | Status: AC | PRN
  Administered 2019-07-10: 75 mL via INTRAVENOUS

## 2019-07-10 NOTE — ED Triage Notes (Signed)
Pt reports taking Pramipexole Dihydrochloride 0.125 mg tab last night pt reports taking starting to take the medication x 2 wks agoi, pt reports taking Sertraline 25 mg tab yesterday and started the med x 2 wks ago only taking intermittently

## 2019-07-10 NOTE — ED Triage Notes (Addendum)
Pt became weak, dizzy and started sweating while cooking her breakfast this morning.  States she felt like she was going to pass out.  States she took her bp immediatly, it was 108/64 then 98/59.  States her bp is normally 160's.

## 2019-07-10 NOTE — ED Provider Notes (Signed)
[facility] EMERGENCY DEPARTMENT Provider Note   CSN: [id number] Arrival date & time: 07/10/19  [id number]     History Chief Complaint  Patient presents with  . Near Syncope    Desiree Bowers is a 74 y.o. female.  Patient states that she was at the sink and felt dizzy felt like she did not pass out she sat down but did not pass out.  Patient feels fine now.  Patient had not eaten yet she was fixing some food  The history is provided by the patient. No language interpreter was used.  Near Syncope This is a new problem. The current episode started less than 1 hour ago. The problem occurs rarely. The problem has been resolved. Pertinent negatives include no chest pain, no abdominal pain and no headaches. Nothing aggravates the symptoms. Nothing relieves the symptoms. She has tried nothing for the symptoms.       Past Medical History:  Diagnosis Date  . Coronary artery disease due to lipid rich plaque 04/13/2016  . Diabetes mellitus without complication ([place])   . Diverticulosis of intestine without bleeding 12/13/2016  . History of right mastectomy 11/15/2014  . Hypothyroidism   . Paget's disease of the bone   . Paroxysmal atrial fibrillation ([place]) 06/19/2014    Patient Active Problem List   Diagnosis Date Noted  . Restless leg syndrome 06/22/2019  . Seasonal allergies 12/21/2018  . Lymphedema of right arm 08/08/2017  . Facet degeneration of lumbar region 08/04/2017  . Overactive bladder 12/13/2016  . DM type 2 with diabetic dyslipidemia ([place]) 04/13/2016  . Primary osteoarthritis involving multiple joints 11/15/2014  . Acquired hypothyroidism 11/15/2014  . Paroxysmal atrial fibrillation ([place]) 06/19/2014  . Gastroesophageal reflux disease without esophagitis 04/04/2014  . GAD (generalized anxiety disorder) 04/04/2014  . ANA positive 01/18/2014  . 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 Essential (primary) hypertension 09/24/2013    Past Surgical History:  Procedure Laterality Date  .  ABDOMINAL HYSTERECTOMY  1999  . APPENDECTOMY     as a child  . CATARACT EXTRACTION, BILATERAL    . MASTECTOMY Right      OB History   No obstetric history on file.     Family History  Problem Relation Age of Onset  . Breast cancer Mother   . Colon cancer Father   . Kidney cancer Father   . Bladder Cancer Sister   . Suicidality Brother   . Heart attack Maternal Grandmother   . Breast cancer Sister   . Stomach cancer Sister   . Ovarian cancer Sister   . Breast cancer Sister   . Breast cancer Sister   . Diabetes Sister   . Other Brother        parathyroid disorder; Engelmann's disease  . Graves' disease Daughter     Social History   Tobacco Use  . Smoking status: Former Research scientist (life sciences)  . Smokeless tobacco: Never Used  Substance Use Topics  . Alcohol use: No  . Drug use: No    Home Medications Prior to Admission medications   Medication Sig Start Date End Date Taking? Authorizing Provider  calcium carbonate (OSCAL) 1500 (600 Ca) MG TABS tablet Take 2 tablets by mouth daily.     [provider]  cetirizine (ZYRTEC) 10 MG tablet Take 1 tablet (10 mg total) by mouth daily. 12/21/18   Loman Brooklyn, FNP  cyclobenzaprine (FLEXERIL) 10 MG tablet Take 10 mg by mouth 3 (three) times daily as needed for muscle spasms.  [provider]  furosemide (LASIX) 20 MG tablet Take 20 mg by mouth daily as needed for edema.  02/16/17   [provider]  hydrOXYzine (VISTARIL) 25 MG capsule Take 1 capsule (25 mg total) by mouth 3 (three) times daily as needed for anxiety. 07/06/19   Loman Brooklyn, FNP  losartan-hydrochlorothiazide (HYZAAR) 100-25 MG tablet Take 1 tablet by mouth daily. 03/13/19   Loman Brooklyn, FNP  magnesium oxide (MAG-OX) 400 (241.3 Mg) MG tablet Take 400 mg by mouth daily.     [provider]  meloxicam (MOBIC) 7.5 MG tablet Take 7.5 mg by mouth daily.  04/05/17   [provider]  methocarbamol (ROBAXIN) 500 MG tablet Take 1-2  tablets (500-1,000 mg total) by mouth every 8 (eight) hours as needed for muscle spasms. 04/26/19   Loman Brooklyn, FNP  oxybutynin (DITROPAN-XL) 10 MG 24 hr tablet Take 10 mg by mouth daily.  03/28/17   [provider]  pramipexole (MIRAPEX) 0.125 MG tablet Take 1 tablet (0.125 mg total) by mouth at bedtime. 07/06/19   Loman Brooklyn, FNP  SYNTHROID 112 MCG tablet Take 112 mcg by mouth daily before breakfast.  04/21/17   [provider]  triamcinolone cream (KENALOG) 0.1 % Apply 1 application topically daily as needed. 06/29/19   [provider]    Allergies    Penicillins, Gabapentin, and Levothyroxine  Review of Systems   Review of Systems  Constitutional: Negative for appetite change and fatigue.  HENT: Negative for congestion, ear discharge and sinus pressure.   Eyes: Negative for discharge.  Respiratory: Negative for cough.   Cardiovascular: Positive for near-syncope. Negative for chest pain.  Gastrointestinal: Negative for abdominal pain and diarrhea.  Genitourinary: Negative for frequency and hematuria.  Musculoskeletal: Negative for back pain.  Skin: Negative for rash.  Neurological: Positive for dizziness. Negative for seizures and headaches.  Psychiatric/Behavioral: Negative for hallucinations.    Physical Exam Updated Vital Signs BP (!) 162/79   Pulse 96   Temp 97.9 F (36.6 C) (Oral)   Resp (!) 21   Ht 5\' 5"  (1.651 m)   Wt 81.6 kg   SpO2 95%   BMI 29.95 kg/m   Physical Exam Vitals and nursing note reviewed.  Constitutional:      Appearance: She is well-developed.  HENT:     Head: Normocephalic.     Nose: Nose normal.  Eyes:     General: No scleral icterus.    Conjunctiva/sclera: Conjunctivae normal.  Neck:     Thyroid: No thyromegaly.  Cardiovascular:     Rate and Rhythm: Normal rate and regular rhythm.     Heart sounds: No murmur. No friction rub. No gallop.   Pulmonary:     Breath sounds: No stridor. No wheezing or  rales.  Chest:     Chest wall: No tenderness.  Abdominal:     General: There is no distension.     Tenderness: There is no abdominal tenderness. There is no rebound.  Musculoskeletal:        General: Normal range of motion.     Cervical back: Neck supple.  Lymphadenopathy:     Cervical: No cervical adenopathy.  Skin:    Findings: No erythema or rash.  Neurological:     Mental Status: She is oriented to person, place, and time.     Motor: No abnormal muscle tone.     Coordination: Coordination normal.  Psychiatric:        Behavior:  Behavior normal.     ED Results / Procedures / Treatments   Labs (all labs ordered are listed, but only abnormal results are displayed) Labs Reviewed  CBC WITH DIFFERENTIAL/PLATELET - Abnormal; Notable for the following components:      Result Value   Hemoglobin 11.2 (*)    All other components within normal limits  COMPREHENSIVE METABOLIC PANEL - Abnormal; Notable for the following components:   Glucose, Bld 120 (*)    BUN 25 (*)    All other components within normal limits  TROPONIN I (HIGH SENSITIVITY)  TROPONIN I (HIGH SENSITIVITY)    EKG None  Radiology CT Chest W Contrast  Result Date: 07/10/2019 CLINICAL DATA:  Weakness, dizziness, diaphoresis, hypotension, abnormal chest x-ray EXAM: CT CHEST WITH CONTRAST TECHNIQUE: Multidetector CT imaging of the chest was performed during intravenous contrast administration. CONTRAST:  86mL OMNIPAQUE IOHEXOL 300 MG/ML  SOLN COMPARISON:  07/10/2019 , 11/01/2017 FINDINGS: Cardiovascular: Heart is unremarkable without pericardial effusion. Extensive atherosclerosis of the thoracic aorta. Mediastinum/Nodes: Multiple subcentimeter lymph nodes are seen throughout the mediastinum, nonspecific. Largest in the precarinal region measures 7 mm in short axis reference image 47 of series 2. Lungs/Pleura: Mild bilateral emphysema with sub pleural bulla bilaterally. No acute airspace disease, effusion, or pneumothorax.  No pulmonary nodule or mass. Calcification within the right anterior chest wall may account for the density seen on preceding chest x-ray. Upper Abdomen: Mild diffuse fatty infiltration of the liver. Otherwise no acute findings. Musculoskeletal: Postsurgical changes are seen from right-sided mastectomy and reconstruction. Dystrophic calcifications within the right breast reconstruction may account for the density seen on chest x-ray. There is cortical and trabecular thickening of the right clavicle as well as the right first through third ribs. Similar appearance is seen within the superior aspect of the right scapula, involving the acromion and glenoid. Multiple small sclerotic lesions are seen within the right clavicle and right humeral head, nonspecific. No acute displaced fractures. Reconstructed images demonstrate no additional findings. IMPRESSION: 1. Mild emphysema.  No acute pleural or parenchymal lung disease. 2. Calcifications within the reconstructed right breast likely account for density seen on chest x-ray. 3. Cortical and trabecular thickening involving the right clavicle, scapula, and first through third ribs as above. Overall, little change since 11/01/2017. I would favor Paget's disease or treated metastatic disease in this patient with a known prior history of breast cancer. 4. Mild fatty infiltration of the liver. Aortic Atherosclerosis (ICD10-I70.0) and Emphysema (ICD10-J43.9). Electronically Signed   By: Randa Ngo M.D.   On: 07/10/2019 12:12   DG Chest Portable 1 View  Result Date: 07/10/2019 CLINICAL DATA:  Dizziness and weakness today. EXAM: PORTABLE CHEST 1 VIEW COMPARISON:  None. FINDINGS: There is a small focal area of ill-defined density in the right midzone which could represent a small area of infiltrate or ill-defined mass. The lungs are otherwise clear. Heart size and vascularity are normal. Aortic atherosclerosis. No effusions. Surgical clips overlie the right hemithorax  consistent with the patient's previous right breast surgery. There is inhomogeneous density and irregularity of the right clavicle and glenoid and right humeral head. No prior studies available for comparison. IMPRESSION: 1. Possible small area of infiltrate or mass in the right midzone. 2. Abnormal appearance of the right clavicle and right humeral head. I cannot exclude metastatic disease at those sites. No prior images of this area are available for comparison. Does the patient have a history of previous osseous metastases? 3.  Aortic Atherosclerosis (ICD10-I70.0). Electronically Signed  By: Lorriane Shire M.D.   On: 07/10/2019 10:56    Procedures Procedures (including critical care time)  Medications Ordered in ED Medications  ondansetron (ZOFRAN-ODT) disintegrating tablet 4 mg (4 mg Oral Not Given 07/10/19 1011)  sodium chloride 0.9 % bolus 1,000 mL (0 mLs Intravenous Stopped 07/10/19 1142)  iohexol (OMNIPAQUE) 300 MG/ML solution 75 mL (75 mLs Intravenous Contrast Given 07/10/19 1153)    ED Course  I have reviewed the triage vital signs and the nursing notes.  Pertinent labs & imaging results that were available during my care of the patient were reviewed by me and considered in my medical decision making (see chart for details).    MDM Rules/Calculators/A&P                      Labs unremarkable.  CT shows no acute changes.  Patient feels better.  She will follow up with her PCP Final Clinical Impression(s) / ED Diagnoses Final diagnoses:  Near syncope    Rx / DC Orders ED Discharge Orders    None       Milton Ferguson, MD 07/10/19 1311

## 2019-07-10 NOTE — Discharge Instructions (Addendum)
Drink plenty of fluids and follow-up with your family doctor next week for recheck or sooner if you are having problems

## 2019-07-11 ENCOUNTER — Telehealth: Payer: Self-pay | Admitting: Family Medicine

## 2019-07-11 NOTE — Telephone Encounter (Signed)
Appt made per patients request 

## 2019-07-16 ENCOUNTER — Other Ambulatory Visit: Payer: Self-pay

## 2019-07-16 NOTE — Progress Notes (Signed)
Assessment & Plan:  1-3. Near syncope/PAC (premature atrial contraction)/Paroxysmal atrial fibrillation [facility]) - Patient reports tachycardia that occurs every single day.  She has a history of atrial fibrillation and is not on medication to treat.  PACs on recent EKG in the ER.  I suggested starting a beta-blocker today but patient was hesitant as she reports in the past when she took metoprolol it raised her heart rate.  She was agreeable to a referral to cardiology.  We discussed that she may need to wear a monitor to make sure she is not going in and out of atrial fibrillation when she feels her heart is racing.  A1c today to ensure this is not a cause of her near syncopal episode.  Encouraged adequate hydration. - Ambulatory referral to Cardiology  4. Prediabetes - Bayer DCA Hb A1c Waived  5. Restless leg syndrome - Prescription adjusted for insurance reasons. - pramipexole (MIRAPEX) 0.125 MG tablet; Take 1 tablet (0.125 mg total) by mouth at bedtime.  Dispense: 30 tablet; Refill: 8   Return as scheduled.  Hendricks Limes, MSN, APRN, FNP-C [facility]  Subjective:    Patient ID: Desiree Bowers, female    DOB: Feb 04, 1946, 74 y.o.   MRN: [id number]  Patient Care Team: Loman Brooklyn, FNP as PCP - General (Family Medicine) Delrae Rend, MD as Consulting Physician (Endocrinology)   Chief Complaint:  Chief Complaint  Patient presents with  . Hospitalization Follow-up    AP 2/2- near syncope    HPI: Desiree Bowers is a 74 y.o. female presenting on 07/17/2019 for Hospitalization Follow-up (AP 2/2- near syncope)  Patient was seen in the ER on 07/10/19 due to an episode of near syncope at home. Labs were unremarkable and CT showed no acute changes. She was discharged with advise to follow-up with PCP.  She did have an EKG done in the hospital that showed some PACs.  Patient reports her blood pressure at home when she started feeling bad was 108/54 with a heart rate  of 60.  When her symptoms got worse her blood pressure was down to 95/39 with a heart rate of 85.  Her daughter is a Marine scientist and had her lie on the couch with her feet elevated which resolved her symptoms of dizziness and feeling as if she was going to pass out.  She does not check her blood pressure at any particular time of day or in relation to her medications.  Review of her other BP readings reveal she averages 130s over 70 to 80s.  She does have an occasional high reading in the [id number] to [id number] systolic.  Most recent A1c of 6.0 in July 2020.  She is worried her near syncope episode may be related to her blood sugar.  She mentions multiple times throughout our visit that her heart races every single day.  It is not constant.  She does have a history of atrial fibrillation.  States that she used to be on metoprolol but she took herself off of it as it made her blood pressure go up.  She has not seen a cardiologist since 2017.  She was previously seeing Dr. Hamilton Capri, but he has moved to [facility] and she does not wish to travel that far.   New complaints: Patient is requesting that her Mirapex prescription be changed from 90 days to 30 days for insurance reasons.  Social history:  Relevant past medical, surgical, family and social history reviewed and updated as indicated.  Interim medical history since our last visit reviewed.  Allergies and medications reviewed and updated.  DATA REVIEWED: CHART IN EPIC  ROS: Negative unless specifically indicated above in HPI.    Current Outpatient Medications:  .  calcium carbonate (OSCAL) 1500 (600 Ca) MG TABS tablet, Take 2 tablets by mouth daily. , Disp: , Rfl:  .  cetirizine (ZYRTEC) 10 MG tablet, Take 1 tablet (10 mg total) by mouth daily., Disp: 90 tablet, Rfl: 1 .  cyclobenzaprine (FLEXERIL) 10 MG tablet, Take 10 mg by mouth 3 (three) times daily as needed for muscle spasms., Disp: , Rfl:  .  furosemide (LASIX) 20 MG tablet, Take 20 mg by mouth daily as  needed for edema. , Disp: , Rfl:  .  losartan-hydrochlorothiazide (HYZAAR) 100-25 MG tablet, Take 1 tablet by mouth daily., Disp: 90 tablet, Rfl: 1 .  magnesium oxide (MAG-OX) 400 (241.3 Mg) MG tablet, Take 400 mg by mouth daily. , Disp: , Rfl:  .  meloxicam (MOBIC) 7.5 MG tablet, Take 7.5 mg by mouth daily. , Disp: , Rfl:  .  methocarbamol (ROBAXIN) 500 MG tablet, Take 1-2 tablets (500-1,000 mg total) by mouth every 8 (eight) hours as needed for muscle spasms., Disp: 30 tablet, Rfl: 2 .  oxybutynin (DITROPAN-XL) 10 MG 24 hr tablet, Take 10 mg by mouth daily. , Disp: , Rfl:  .  pramipexole (MIRAPEX) 0.125 MG tablet, Take 1 tablet (0.125 mg total) by mouth at bedtime., Disp: 30 tablet, Rfl: 8 .  SYNTHROID 112 MCG tablet, Take 112 mcg by mouth daily before breakfast. , Disp: , Rfl:  .  triamcinolone cream (KENALOG) 0.1 %, Apply 1 application topically daily as needed., Disp: , Rfl:    Allergies  Allergen Reactions  . Penicillins Rash  . Gabapentin Other (See Comments)    Couldn't move legs during the night after taking.  . Levothyroxine Itching   Past Medical History:  Diagnosis Date  . Coronary artery disease due to lipid rich plaque 04/13/2016  . Diabetes mellitus without complication ([place])   . Diverticulosis of intestine without bleeding 12/13/2016  . History of right mastectomy 11/15/2014  . Hypothyroidism   . Paget's disease of the bone   . Paroxysmal atrial fibrillation ([place]) 06/19/2014    Past Surgical History:  Procedure Laterality Date  . ABDOMINAL HYSTERECTOMY  1999  . APPENDECTOMY     as a child  . CATARACT EXTRACTION, BILATERAL    . MASTECTOMY Right     Social History   Socioeconomic History  . Marital status: Widowed    Spouse name: Not on file  . Number of children: Not on file  . Years of education: Not on file  . Highest education level: Not on file  Occupational History  . Not on file  Tobacco Use  . Smoking status: Former Research scientist (life sciences)  . Smokeless tobacco: Never  Used  Substance and Sexual Activity  . Alcohol use: No  . Drug use: No  . Sexual activity: Not on file  Other Topics Concern  . Not on file  Social History Narrative  . Not on file   Social Determinants of Health   Financial Resource Strain:   . Difficulty of Paying Living Expenses: Not on file  Food Insecurity:   . Worried About Charity fundraiser in the Last Year: Not on file  . Ran Out of Food in the Last Year: Not on file  Transportation Needs:   . Lack of Transportation (Medical): Not on file  .  Lack of Transportation (Non-Medical): Not on file  Physical Activity:   . Days of Exercise per Week: Not on file  . Minutes of Exercise per Session: Not on file  Stress:   . Feeling of Stress : Not on file  Social Connections:   . Frequency of Communication with Friends and Family: Not on file  . Frequency of Social Gatherings with Friends and Family: Not on file  . Attends Religious Services: Not on file  . Active Member of Clubs or Organizations: Not on file  . Attends Archivist Meetings: Not on file  . Marital Status: Not on file  Intimate Partner Violence:   . Fear of Current or Ex-Partner: Not on file  . Emotionally Abused: Not on file  . Physically Abused: Not on file  . Sexually Abused: Not on file        Objective:    BP 122/71   Pulse 99   Temp 98.4 F (36.9 C) (Temporal)   Ht 5\' 5"  (1.651 m)   Wt 179 lb 12.8 oz (81.6 kg)   SpO2 95%   BMI 29.92 kg/m   Physical Exam Vitals reviewed.  Constitutional:      General: She is not in acute distress.    Appearance: Normal appearance. She is overweight. She is not ill-appearing, toxic-appearing or diaphoretic.  HENT:     Head: Normocephalic and atraumatic.  Eyes:     General: No scleral icterus.       Right eye: No discharge.        Left eye: No discharge.     Conjunctiva/sclera: Conjunctivae normal.  Cardiovascular:     Rate and Rhythm: Normal rate and regular rhythm.     Heart sounds: Normal  heart sounds. No murmur. No friction rub. No gallop.   Pulmonary:     Effort: Pulmonary effort is normal. No respiratory distress.     Breath sounds: Normal breath sounds. No stridor. No wheezing, rhonchi or rales.  Musculoskeletal:        General: Normal range of motion.     Cervical back: Normal range of motion.  Skin:    General: Skin is warm and dry.     Capillary Refill: Capillary refill takes less than 2 seconds.  Neurological:     General: No focal deficit present.     Mental Status: She is alert and oriented to person, place, and time. Mental status is at baseline.  Psychiatric:        Mood and Affect: Mood normal.        Behavior: Behavior normal.        Thought Content: Thought content normal.        Judgment: Judgment normal.     No results found for: TSH Lab Results  Component Value Date   WBC 5.0 07/10/2019   HGB 11.2 (L) 07/10/2019   HCT 36.0 07/10/2019   MCV 83.9 07/10/2019   PLT 356 07/10/2019   Lab Results  Component Value Date   NA 137 07/10/2019   K 4.1 07/10/2019   CO2 25 07/10/2019   GLUCOSE 120 (H) 07/10/2019   BUN 25 (H) 07/10/2019   CREATININE 0.92 07/10/2019   BILITOT 0.4 07/10/2019   ALKPHOS 82 07/10/2019   AST 29 07/10/2019   ALT 19 07/10/2019   PROT 7.3 07/10/2019   ALBUMIN 3.7 07/10/2019   CALCIUM 9.1 07/10/2019   ANIONGAP 9 07/10/2019   No results found for: CHOL No results found for: HDL No  results found for: [place] No results found for: TRIG No results found for: [facility] Lab Results  Component Value Date   HGBA1C 6.0 01/04/2019

## 2019-07-17 ENCOUNTER — Ambulatory Visit (INDEPENDENT_AMBULATORY_CARE_PROVIDER_SITE_OTHER): Payer: Medicare Other | Admitting: Family Medicine

## 2019-07-17 ENCOUNTER — Encounter: Payer: Self-pay | Admitting: Family Medicine

## 2019-07-17 VITALS — BP 122/71 | HR 99 | Temp 98.4°F | Ht 65.0 in | Wt 179.8 lb

## 2019-07-17 DIAGNOSIS — R7303 Prediabetes: Secondary | ICD-10-CM

## 2019-07-17 DIAGNOSIS — I491 Atrial premature depolarization: Secondary | ICD-10-CM | POA: Diagnosis not present

## 2019-07-17 DIAGNOSIS — I480 Paroxysmal atrial fibrillation: Secondary | ICD-10-CM | POA: Diagnosis not present

## 2019-07-17 DIAGNOSIS — G2581 Restless legs syndrome: Secondary | ICD-10-CM

## 2019-07-17 DIAGNOSIS — R55 Syncope and collapse: Secondary | ICD-10-CM

## 2019-07-17 LAB — BAYER DCA HB A1C WAIVED: HB A1C (BAYER DCA - WAIVED): 6 % (ref <7.0)

## 2019-07-17 MED ORDER — PRAMIPEXOLE DIHYDROCHLORIDE 0.125 MG PO TABS: 0.1250 mg | ORAL_TABLET | Freq: Every day | ORAL | 8 refills | Status: DC

## 2019-07-17 NOTE — Patient Instructions (Signed)
Premature Atrial Contraction  A premature atrial contraction [facility]) is a kind of irregular heartbeat (arrhythmia). It happens when the heart beats too early and then pauses before beating again. The heart has four areas, or chambers. Normally, electrical signals spread across the heart and make all the chambers beat together. During a PAC, the upper chambers of the heart (atria) beat too early, before they have had time to fill with blood. The heartbeat pauses afterward so the heart can fill with blood for the next beat. Sometimes PAC can be a warning sign of another type of arrhythmia called atrial fibrillation. Atrial fibrillation may allow blood to pool in the atria and form clots. If a clot travels to the brain, it can cause a stroke. What are the causes? The cause of this condition is often unknown. Sometimes, this condition may be caused by heart disease or injury to the heart. What increases the risk? You are more likely to develop this condition if:  You are a child.  You are an adult who is 74 years of age or older. Episodes may be triggered by:  Caffeine.  Alcohol.  Tobacco use.  Stimulant drugs.  Some medicines or supplements.  Stress.  Heart disease. What are the signs or symptoms? Symptoms of this condition include:  A feeling that your heart skipped a beat. The first heartbeat after the "skipped" beat may feel more forceful.  A feeling that your heart is fluttering. How is this diagnosed? This condition is diagnosed based on:  Your symptoms.  A physical exam. Your health care provider may listen to your heart.  An electrocardiogram (ECG). This is a test that records the electrical impulses of the heart.  An ambulatory cardiac monitor. This device records your heartbeats for 24 hours or more. You may also have:  An echocardiogram to check for any heart conditions. This is a type of imaging test that uses sound waves (ultrasound) to make images of your  heart.  Blood tests. How is this treated? Treatment depends on the frequency of your symptoms and other risk factors. Treatments may include:  Medicines (beta-blockers).  Catheter ablation. This is done to destroy the part of the heart tissue that sends abnormal signals. In some cases, treatment may not be needed for this condition. Follow these instructions at home: Lifestyle  Do not use any products that contain nicotine or tobacco, such as cigarettes, e-cigarettes, and chewing tobacco. If you need help quitting, ask your health care provider.  Exercise regularly. Ask your health care provider what type of exercise is safe for you.  Find healthy ways to manage stress.  Try to get at least 7-9 hours of sleep each night, or as much as recommended by your health care provider. Alcohol use  Do not drink alcohol if: ? Your health care provider tells you not to drink. ? You are pregnant, may be pregnant, or are planning to become pregnant. ? Alcohol triggers your episodes.  If you drink alcohol: ? Limit how much you use to:  0-1 drink a day for women.  0-2 drinks a day for men. ? Be aware of how much alcohol is in your drink. In the [place] one drink equals one 12 oz bottle of beer (355 mL), one 5 oz glass of wine (148 mL), or one 1 oz glass of hard liquor (44 mL). General instructions  Take over-the-counter and prescription medicines only as told by your health care provider.  If caffeine triggers episodes, do not eat,  drink, or use anything with caffeine in it.  Keep all follow-up visits as told by your health care provider. This is important. Contact a health care provider if:  You feel your heart skipping beats.  Your heart skips beats and you feel dizzy, light-headed, or very tired. Get help right away if you have:  Chest pain.  Trouble breathing.  Any symptoms of a stroke. "BE FAST" is an easy way to remember the main warning signs of a stroke. ? B - Balance.  Signs are dizziness, sudden trouble walking, or loss of balance. ? E - Eyes. Signs are trouble seeing or a sudden change in vision. ? F - Face. Signs are sudden weakness or numbness of the face, or the face or eyelid drooping on one side. ? A - Arms. Signs are weakness or numbness in an arm. This happens suddenly and usually on one side of the body. ? S - Speech. Signs are sudden trouble speaking, slurred speech, or trouble understanding what people say. ? T - Time. Time to call emergency services. Write down what time symptoms started.  Other signs of stroke, such as: ? A sudden, severe headache with no known cause. ? Nausea or vomiting. ? Seizure. These symptoms may represent a serious problem that is an emergency. Do not wait to see if the symptoms will go away. Get medical help right away. Call your local emergency services (911 in the [place] Do not drive yourself to the hospital. Summary  A premature atrial contraction (PAC) is a kind of irregular heartbeat (arrhythmia). It happens when the heart beats too early and then pauses before beating again.  Treatment depends on your symptoms and whether you have other underlying heart conditions.  Contact a health care provider if your heart skips beats and you feel dizzy, light-headed, or very tired.  In some cases, this condition may lead to a stroke. "BE FAST" is an easy way to remember the warning signs of stroke. Get help right away if you have any of the "BE FAST" signs. This information is not intended to replace advice given to you by your health care provider. Make sure you discuss any questions you have with your health care provider. Document Revised: 02/16/2018 Document Reviewed: 02/16/2018 Elsevier Patient Education  2020 [employer] Inc.  

## 2019-07-27 ENCOUNTER — Ambulatory Visit: Payer: Medicare Other | Admitting: Cardiology

## 2019-08-06 ENCOUNTER — Telehealth: Payer: Self-pay | Admitting: Family Medicine

## 2019-08-06 NOTE — Telephone Encounter (Signed)
Pt informed. She has a follow up in May. She is scheduled for a AWV by phone 08/07/19

## 2019-08-06 NOTE — Telephone Encounter (Signed)
Stop taking if causing swelling and discuss with PCP at next visit

## 2019-08-06 NOTE — Telephone Encounter (Signed)
Patient was given Mirapex on 2/9 for RLS.  Patient states that it is helping but making her legs swell.  Covering PCP- please advise

## 2019-08-07 ENCOUNTER — Ambulatory Visit: Payer: Medicare Other | Admitting: *Deleted

## 2019-08-07 ENCOUNTER — Telehealth: Payer: Self-pay | Admitting: *Deleted

## 2019-08-07 DIAGNOSIS — Z0000 Encounter for general adult medical examination without abnormal findings: Secondary | ICD-10-CM

## 2019-08-07 NOTE — Progress Notes (Signed)
MEDICARE ANNUAL WELLNESS VISIT  08/07/2019  Telephone Visit Disclaimer This Medicare AWV was conducted by telephone due to national recommendations for restrictions regarding the COVID-19 Pandemic (e.g. social distancing).  I verified, using two identifiers, that I am speaking with Desiree Bowers or their authorized healthcare agent. I discussed the limitations, risks, security, and privacy concerns of performing an evaluation and management service by telephone and the potential availability of an in-person appointment in the future. The patient expressed understanding and agreed to proceed.   Subjective:  Desiree Bowers is a 74 y.o. female patient of Desiree Brooklyn, FNP who had a Medicare Annual Wellness Visit today via telephone. Desiree Bowers is Retired and lives alone. she has 4 adult children. she reports that she is socially active and does interact with friends/family regularly. she is minimally physically active and enjoys reading.  Patient Care Team: Desiree Brooklyn, FNP as PCP - General (Family Medicine) Desiree Bowie Alphonse Guild, MD as PCP - Cardiology (Cardiology) Desiree Rend, MD as Consulting Physician (Endocrinology) Desiree Craze, MD as Referring Physician (Dermatology)  Advanced Directives 08/07/2019 07/10/2019  Does Patient Have a Medical Advance Directive? No No  Would patient like information on creating a medical advance directive? No - Patient declined No - Patient declined    Hospital Utilization Over the Past 12 Months: # of hospitalizations or ER visits: 1 # of surgeries: 0  Review of Systems    Patient reports that her overall health is better compared to last year.  History obtained from chart review and the patient  Patient Reported Readings (BP, Pulse, CBG, Weight, etc) CBG-136  Pain Assessment Pain : No/denies pain     Current Medications & Allergies (verified) Allergies as of 08/07/2019      Reactions   Mirapex [pramipexole Dihydrochloride] Itching,  Swelling   Penicillins Rash   Gabapentin Other (See Comments)   Couldn't move legs during the night after taking.   Levothyroxine Itching      Medication List       Accurate as of August 07, 2019  8:49 AM. If you have any questions, ask your nurse or doctor.        STOP taking these medications   pramipexole 0.125 MG tablet Commonly known as: Mirapex     TAKE these medications   calcium carbonate 1500 (600 Ca) MG Tabs tablet Commonly known as: OSCAL Take 2 tablets by mouth daily.   cetirizine 10 MG tablet Commonly known as: ZYRTEC Take 1 tablet (10 mg total) by mouth daily.   cyclobenzaprine 10 MG tablet Commonly known as: FLEXERIL Take 10 mg by mouth 3 (three) times daily as needed for muscle spasms.   furosemide 20 MG tablet Commonly known as: LASIX Take 20 mg by mouth daily as needed for edema.   losartan-hydrochlorothiazide 100-25 MG tablet Commonly known as: HYZAAR Take 1 tablet by mouth daily.   magnesium oxide 400 (241.3 Mg) MG tablet Commonly known as: MAG-OX Take 400 mg by mouth daily.   meloxicam 7.5 MG tablet Commonly known as: MOBIC Take 7.5 mg by mouth daily.   methocarbamol 500 MG tablet Commonly known as: Robaxin Take 1-2 tablets (500-1,000 mg total) by mouth every 8 (eight) hours as needed for muscle spasms.   oxybutynin 10 MG 24 hr tablet Commonly known as: DITROPAN-XL Take 10 mg by mouth daily.   Synthroid 112 MCG tablet Generic drug: levothyroxine Take 112 mcg by mouth daily before breakfast.   triamcinolone cream 0.1 % Commonly  known as: KENALOG Apply 1 application topically daily as needed.       History (reviewed): Past Medical History:  Diagnosis Date  . Cancer [facility])    breast   . Coronary artery disease due to lipid rich plaque 04/13/2016  . Diabetes mellitus without complication ([place])   . Diverticulosis of intestine without bleeding 12/13/2016  . History of right mastectomy 11/15/2014  . Hypothyroidism   . Overactive  bladder   . Paget's disease of the bone   . Paroxysmal atrial fibrillation ([place]) 06/19/2014  . Restless leg syndrome    Past Surgical History:  Procedure Laterality Date  . ABDOMINAL HYSTERECTOMY  1999  . APPENDECTOMY     as a child  . CATARACT EXTRACTION, BILATERAL    . MASTECTOMY Right    Family History  Problem Relation Age of Onset  . Breast cancer Mother   . Colon cancer Father   . Kidney cancer Father   . Bladder Cancer Sister   . Suicidality Brother   . Heart attack Maternal Grandmother   . Breast cancer Sister   . Stomach cancer Sister   . Ovarian cancer Sister   . Breast cancer Sister   . Breast cancer Sister   . Diabetes Sister   . Other Brother        parathyroid disorder; Engelmann's disease  . Angelman syndrome Son   . Graves' disease Daughter    Social History   Socioeconomic History  . Marital status: Widowed    Spouse name: Not on file  . Number of children: 4  . Years of education: Not on file  . Highest education level: GED or equivalent  Occupational History  . Occupation: Retired  Tobacco Use  . Smoking status: Former Research scientist (life sciences)  . Smokeless tobacco: Never Used  Substance and Sexual Activity  . Alcohol use: No  . Drug use: No  . Sexual activity: Not Currently  Other Topics Concern  . Not on file  Social History Narrative  . Not on file   Social Determinants of Health   Financial Resource Strain:   . Difficulty of Paying Living Expenses: Not on file  Food Insecurity:   . Worried About Charity fundraiser in the Last Year: Not on file  . Ran Out of Food in the Last Year: Not on file  Transportation Needs:   . Lack of Transportation (Medical): Not on file  . Lack of Transportation (Non-Medical): Not on file  Physical Activity:   . Days of Exercise per Week: Not on file  . Minutes of Exercise per Session: Not on file  Stress:   . Feeling of Stress : Not on file  Social Connections:   . Frequency of Communication with Friends and Family:  Not on file  . Frequency of Social Gatherings with Friends and Family: Not on file  . Attends Religious Services: Not on file  . Active Member of Clubs or Organizations: Not on file  . Attends Archivist Meetings: Not on file  . Marital Status: Not on file    Activities of Daily Living In your present state of health, do you have any difficulty performing the following activities: 08/07/2019  Hearing? N  Vision? N  Difficulty concentrating or making decisions? N  Walking or climbing stairs? N  Dressing or bathing? N  Doing errands, shopping? N  Preparing Food and eating ? N  Using the Toilet? N  In the past six months, have you accidently leaked urine?  Y  Comment at times due to overactive bladder  Do you have problems with loss of bowel control? N  Managing your Medications? N  Managing your Finances? N  Housekeeping or managing your Housekeeping? N  Some recent data might be hidden    Patient Education/ Literacy How often do you need to have someone help you when you read instructions, pamphlets, or other written materials from your doctor or pharmacy?: 1 - Never What is the last grade level you completed in school?: GED  Exercise Current Exercise Habits: Home exercise routine, Type of exercise: walking, Time (Minutes): 15, Frequency (Times/Week): 3, Weekly Exercise (Minutes/Week): 45, Intensity: Mild, Exercise limited by: None identified  Diet Patient reports consuming 3 meals a day and 3 snack(s) a day Patient reports that her primary diet is: Regular, Low Sodium Patient reports that she does have regular access to food.   Depression Screen PHQ 2/9 Scores 08/07/2019 07/17/2019 04/26/2019 12/21/2018 07/20/2018 04/19/2018  PHQ - 2 Score 0 0 0 0 0 0  PHQ- 9 Score - - 1 - - -     Fall Risk Fall Risk  08/07/2019 07/17/2019 04/26/2019 07/20/2018 04/19/2018  Falls in the past year? 0 0 1 0 0  Number falls in past yr: - - 0 - -  Injury with Fall? - - 0 - -  Follow up - -  Falls prevention discussed - -     Objective:  Desiree Bowers seemed alert and oriented and she participated appropriately during our telephone visit.  Blood Pressure Weight BMI  BP Readings from Last 3 Encounters:  07/17/19 122/71  07/10/19 (!) 164/70  04/26/19 132/80   Wt Readings from Last 3 Encounters:  07/17/19 179 lb 12.8 oz (81.6 kg)  07/10/19 180 lb (81.6 kg)  04/26/19 173 lb 12.8 oz (78.8 kg)   BMI Readings from Last 1 Encounters:  07/17/19 29.92 kg/m    *Unable to obtain current vital signs, weight, and BMI due to telephone visit type  Hearing/Vision  . Desiree Bowers did not seem to have difficulty with hearing/understanding during the telephone conversation . Reports that she has not had a formal eye exam by an eye care professional within the past year . Reports that she has not had a formal hearing evaluation within the past year *Unable to fully assess hearing and vision during telephone visit type  Cognitive Function: 6CIT Screen 08/07/2019  What Year? 0 points  What month? 0 points  What time? 0 points  Count back from 20 0 points  Months in reverse 0 points  Repeat phrase 0 points  Total Score 0   (Normal:0-7, Significant for Dysfunction: >8)  Normal Cognitive Function Screening: Yes   Immunization & Health Maintenance Record Immunization History  Administered Date(s) Administered  . Influenza,trivalent, recombinat, inj, PF 03/12/2014  . Influenza-Unspecified 06/10/2015  . Tdap 11/15/2014  . Zoster 10/02/2015    Health Maintenance  Topic Date Due  . OPHTHALMOLOGY EXAM  02/25/1956  . INFLUENZA VACCINE  09/05/2019 (Originally 01/06/2019)  . PNA vac Low Risk Adult (1 of 2 - PCV13) 03/12/2020 (Originally 02/25/2011)  . MAMMOGRAM  09/15/2019  . HEMOGLOBIN A1C  01/14/2020  . FOOT EXAM  04/25/2020  . COLONOSCOPY  04/01/2024  . TETANUS/TDAP  11/14/2024  . DEXA SCAN  Completed  . Hepatitis C Screening  Completed       Assessment  This is a routine  wellness examination for [facility].  Health Maintenance: Due or Overdue Health Maintenance Due  Topic Date Due  . OPHTHALMOLOGY EXAM  02/25/1956    Desiree Bowers does not need a referral for Community Assistance: Care Management:   no Social Work:    no Prescription Assistance:  no Nutrition/Diabetes Education:  no   Plan:  Personalized Goals Goals Addressed   None    Personalized Health Maintenance & Screening Recommendations  Eye Exam  Lung Cancer Screening Recommended: no (Low Dose CT Chest recommended if Age 79-80 years, 30 pack-year currently smoking OR have quit w/in past 15 years) Hepatitis C Screening recommended: no HIV Screening recommended: no  Advanced Directives: Written information was not prepared per patient's request.  Referrals & Orders No orders of the defined types were placed in this encounter.   Follow-up Plan . Follow-up with Desiree Brooklyn, FNP as planned . Schedule your yearly eye exam  . Schedule appointment to discuss overactive bladder.    I have personally reviewed and noted the following in the patient's chart:   . Medical and social history . Use of alcohol, tobacco or illicit drugs  . Current medications and supplements . Functional ability and status . Nutritional status . Physical activity . Advanced directives . List of other physicians . Hospitalizations, surgeries, and ER visits in previous 12 months . Vitals . Screenings to include cognitive, depression, and falls . Referrals and appointments  In addition, I have reviewed and discussed with Desiree Bowers certain preventive protocols, quality metrics, and best practice recommendations. A written personalized care plan for preventive services as well as general preventive health recommendations is available and can be mailed to the patient at her request.      Lynnea Ferrier, LPN  [id number]

## 2019-08-07 NOTE — Telephone Encounter (Signed)
Patient states that she started mirapex about a month ago. She states several days ago she started swelling in her lower extremities and itching all over her abdomen. Patient took the last mirapex on Sunday the 28th. Since stopping patient states that the swelling and itching has stopped. Please advise

## 2019-08-07 NOTE — Telephone Encounter (Signed)
Pt aware.

## 2019-08-07 NOTE — Telephone Encounter (Signed)
It does not seem likely her symptoms were due to the medication since she has been on it for almost two months now. If the swelling and itching has subsided, she could try resuming the mirapex. If symptoms return again then she can stop it.

## 2019-08-10 ENCOUNTER — Other Ambulatory Visit: Payer: Self-pay | Admitting: Family Medicine

## 2019-08-14 ENCOUNTER — Other Ambulatory Visit: Payer: Self-pay

## 2019-08-14 ENCOUNTER — Ambulatory Visit (INDEPENDENT_AMBULATORY_CARE_PROVIDER_SITE_OTHER): Payer: Medicare Other | Admitting: Cardiology

## 2019-08-14 ENCOUNTER — Encounter: Payer: Self-pay | Admitting: Cardiology

## 2019-08-14 VITALS — BP 166/84 | HR 76 | Ht 65.0 in | Wt 184.0 lb

## 2019-08-14 DIAGNOSIS — R55 Syncope and collapse: Secondary | ICD-10-CM

## 2019-08-14 DIAGNOSIS — R002 Palpitations: Secondary | ICD-10-CM | POA: Diagnosis not present

## 2019-08-14 NOTE — Patient Instructions (Signed)
Your physician recommends that you schedule a follow-up appointment in: [place] recommends that you continue on your current medications as directed. Please refer to the Current Medication list given to you today.  Your physician has recommended that you wear an event monitor FOR 21 DAYS. Event monitors are medical devices that record the heart's electrical activity. Doctors most often [place] these monitors to diagnose arrhythmias. Arrhythmias are problems with the speed or rhythm of the heartbeat. The monitor is a small, portable device. You can wear one while you do your normal daily activities. This is usually used to diagnose what is causing palpitations/syncope (passing out).  Thank you for choosing [facility]!!

## 2019-08-14 NOTE — Progress Notes (Signed)
Clinical Summary Ms. Pruyn is a 74 y.o.female seen today as a new consult, referred by FNP Blanch Media for presyncope   1. Near syncope - ER visit 07/2019 with dizziness, near syncope - EKG shows SR, rare PACs  - occurred at home while cooking breakfast. While standing, sudden felt dizzy, nearly fell to floor. Does not any palpitations. Made way to kitchen chair, checked her bp and it was around 108/89, then SBPs in the 90s.  - called her daughter who is a Marine scientist. Layed down, better after 10 minutes - since that time mild near episode.  - drinks glasses of water x 2. Coffee 1 cup caffeinated. Occasional sodas.  - occasional orthostatlic symptoms.  - can have some occasional palpitations.    - from 08/2015 [facility] cardiology notes she has a long history of tachycardia, only sinus tach has been identified - Dr Dion Body [facility] cards note Jan 2016 lists PAF in the problem list but in the A/P he reports he is obtaining a monitor b/c if she has PAF would have implications for anticoag - monitor from [facility] only NSR, occasional PACs.   2. PAF? - this is listed in her chart however after chart review of her prior cardiologist notes this cannt be confirmed - she herself is unaware of this diagnosis    Past Medical History:  Diagnosis Date  . Cancer [facility])    breast   . Coronary artery disease due to lipid rich plaque 04/13/2016  . Diabetes mellitus without complication ([place])   . Diverticulosis of intestine without bleeding 12/13/2016  . History of right mastectomy 11/15/2014  . Hypothyroidism   . Overactive bladder   . Paget's disease of the bone   . Paroxysmal atrial fibrillation ([place]) 06/19/2014  . Restless leg syndrome      Allergies  Allergen Reactions  . Mirapex [Pramipexole Dihydrochloride] Itching and Swelling  . Penicillins Rash  . Gabapentin Other (See Comments)    Couldn't move legs during the night after taking.  . Levothyroxine Itching     Current Outpatient  Medications  Medication Sig Dispense Refill  . calcium carbonate (OSCAL) 1500 (600 Ca) MG TABS tablet Take 2 tablets by mouth daily.     . cetirizine (ZYRTEC) 10 MG tablet Take 1 tablet (10 mg total) by mouth daily. 90 tablet 1  . cyclobenzaprine (FLEXERIL) 10 MG tablet Take 10 mg by mouth 3 (three) times daily as needed for muscle spasms.    . furosemide (LASIX) 20 MG tablet Take 20 mg by mouth daily as needed for edema.     Marland Kitchen losartan-hydrochlorothiazide (HYZAAR) 100-25 MG tablet Take 1 tablet by mouth daily. 90 tablet 1  . magnesium oxide (MAG-OX) 400 (241.3 Mg) MG tablet Take 400 mg by mouth daily.     . meloxicam (MOBIC) 7.5 MG tablet Take 7.5 mg by mouth daily.     . methocarbamol (ROBAXIN) 500 MG tablet Take 1-2 tablets (500-1,000 mg total) by mouth every 8 (eight) hours as needed for muscle spasms. 30 tablet 2  . oxybutynin (DITROPAN-XL) 10 MG 24 hr tablet TAKE ONE (1) TABLET EACH DAY 90 tablet 0  . SYNTHROID 112 MCG tablet Take 112 mcg by mouth daily before breakfast.     . triamcinolone cream (KENALOG) 0.1 % Apply 1 application topically daily as needed.     No current facility-administered medications for this visit.     Past Surgical History:  Procedure Laterality Date  . ABDOMINAL HYSTERECTOMY  1999  .  APPENDECTOMY     as a child  . CATARACT EXTRACTION, BILATERAL    . MASTECTOMY Right      Allergies  Allergen Reactions  . Mirapex [Pramipexole Dihydrochloride] Itching and Swelling  . Penicillins Rash  . Gabapentin Other (See Comments)    Couldn't move legs during the night after taking.  . Levothyroxine Itching      Family History  Problem Relation Age of Onset  . Breast cancer Mother   . Colon cancer Father   . Kidney cancer Father   . Bladder Cancer Sister   . Suicidality Brother   . Heart attack Maternal Grandmother   . Breast cancer Sister   . Stomach cancer Sister   . Ovarian cancer Sister   . Breast cancer Sister   . Breast cancer Sister   .  Diabetes Sister   . Other Brother        parathyroid disorder; Engelmann's disease  . Angelman syndrome Son   . Graves' disease Daughter      Social History Ms. Natter reports that she has quit smoking. She has never used smokeless tobacco. Ms. Marchel reports no history of alcohol use.   Review of Systems CONSTITUTIONAL: No weight loss, fever, chills, weakness or fatigue.  HEENT: Eyes: No visual loss, blurred vision, double vision or yellow sclerae.No hearing loss, sneezing, congestion, runny nose or sore throat.  SKIN: No rash or itching.  CARDIOVASCULAR: per hpi RESPIRATORY: No shortness of breath, cough or sputum.  GASTROINTESTINAL: No anorexia, nausea, vomiting or diarrhea. No abdominal pain or blood.  GENITOURINARY: No burning on urination, no polyuria NEUROLOGICAL: No headache, dizziness, syncope, paralysis, ataxia, numbness or tingling in the extremities. No change in bowel or bladder control.  MUSCULOSKELETAL: No muscle, back pain, joint pain or stiffness.  LYMPHATICS: No enlarged nodes. No history of splenectomy.  PSYCHIATRIC: No history of depression or anxiety.  ENDOCRINOLOGIC: No reports of sweating, cold or heat intolerance. No polyuria or polydipsia.  Marland Kitchen   Physical Examination Today's Vitals   08/14/19 1036  BP: (!) 166/84  Pulse: 76  SpO2: 98%  Weight: 184 lb (83.5 kg)  Height: 5\' 5"  (1.651 m)   Body mass index is 30.62 kg/m.  Gen: resting comfortably, no acute distress HEENT: no scleral icterus, pupils equal round and reactive, no palptable cervical adenopathy,  CV: RRR, no mr/g, no jvd Resp: Clear to auscultation bilaterally GI: abdomen is soft, non-tender, non-distended, normal bowel sounds, no hepatosplenomegaly MSK: extremities are warm, no edema.  Skin: warm, no rash Neuro:  no focal deficits Psych: appropriate affect   Diagnostic Studies 08/2015 cath [facility] FINDINGS:  Coronary Angiography 1. Left Main - Normal 2. Left anterior descending  artery - the LAD gives off a small first diagonal and a large second diagonal, normal. 3. Diagonals - Normal 4. Left Circumflex - the circumflex gives off a large OM1, small OM 2, and moderately large OM 3, normal. 5. Obtuse Marginals - Normal 6. Right Coronary Artery - the RCA gives off the PDA and small PLV branches, normal.There is a conus Nilan Iddings that originates on the aorta, dual lumen with the RCA. 7. Posterior Descending Artery - Normal  Additional comments on angiography: Right Dominance  1.Normal coronary arteries. 2.LVEDP 20 3.RRA access.There was spasm of the radial artery near the level of the elbow so access was changed from [place] to right femoral artery.RFA was closed with Perclose vascular suture device. 4. No Complications.   RECOMMENDATIONS:  1. Continue workup for etiology of  current symptoms. 2. Followup with Dr. Hamilton Capri for with her PCP. 3. Continue risk factor modification.   Assessment and Plan  1. Presyncope/Near syncope - unclear etiology - orthostatics today are normal - we will obtain a 21 day event monitor  2. Palpitations - obtain 21 day event monitor - EKG today shows NSR        Arnoldo Lenis, M.D.

## 2019-08-15 ENCOUNTER — Telehealth: Payer: Self-pay | Admitting: Cardiology

## 2019-08-15 NOTE — Telephone Encounter (Signed)
Advised that she was enrolled on yesterday. Gave number to [employer] to call and check status [phone]).

## 2019-08-15 NOTE — Telephone Encounter (Signed)
Called in reference to no one has contacted her about her monitor

## 2019-08-20 ENCOUNTER — Telehealth: Payer: Self-pay | Admitting: *Deleted

## 2019-08-20 NOTE — Telephone Encounter (Signed)
Pt notified that [employer] monitor should arrive on tomorrow. Pt thankful for the information.

## 2019-08-21 ENCOUNTER — Encounter (INDEPENDENT_AMBULATORY_CARE_PROVIDER_SITE_OTHER): Payer: Medicare Other

## 2019-08-21 DIAGNOSIS — R55 Syncope and collapse: Secondary | ICD-10-CM

## 2019-08-21 DIAGNOSIS — R002 Palpitations: Secondary | ICD-10-CM

## 2019-08-22 ENCOUNTER — Ambulatory Visit (INDEPENDENT_AMBULATORY_CARE_PROVIDER_SITE_OTHER): Payer: Medicare Other | Admitting: Family Medicine

## 2019-08-22 ENCOUNTER — Encounter: Payer: Self-pay | Admitting: Family Medicine

## 2019-08-22 DIAGNOSIS — J22 Unspecified acute lower respiratory infection: Secondary | ICD-10-CM | POA: Diagnosis not present

## 2019-08-22 MED ORDER — PREDNISONE 10 MG (21) PO TBPK: ORAL_TABLET | ORAL | 0 refills | Status: DC

## 2019-08-22 MED ORDER — AZITHROMYCIN 250 MG PO TABS: ORAL_TABLET | ORAL | 0 refills | Status: DC

## 2019-08-22 NOTE — Progress Notes (Signed)
Virtual Visit via Telephone Note  I connected with Desiree Bowers on 08/22/19 at 3:20 PM by telephone and verified that I am speaking with the correct person using two identifiers. Desiree Bowers is currently located at home and nobody is currently with her during this visit. The provider, Loman Brooklyn, FNP is located in their home at time of visit.  I discussed the limitations, risks, security and privacy concerns of performing an evaluation and management service by telephone and the availability of in person appointments. I also discussed with the patient that there may be a patient responsible charge related to this service. The patient expressed understanding and agreed to proceed.  Subjective: PCP: Loman Brooklyn, FNP  Chief Complaint  Patient presents with  . URI   Patient complains of cough and chest congestion. Onset of symptoms was several months ago, gradually worsening since that time. She is drinking plenty of fluids. Evaluation to date: none. Treatment to date: Mucinex-DM. She has a history of allergies and emphysema. She does not smoke.    ROS: Per HPI  Current Outpatient Medications:  .  aspirin EC 81 MG tablet, Take 81 mg by mouth daily., Disp: , Rfl:  .  calcium carbonate (OSCAL) 1500 (600 Ca) MG TABS tablet, Take 2 tablets by mouth daily. , Disp: , Rfl:  .  cetirizine (ZYRTEC) 10 MG tablet, Take 1 tablet (10 mg total) by mouth daily., Disp: 90 tablet, Rfl: 1 .  cyclobenzaprine (FLEXERIL) 10 MG tablet, Take 10 mg by mouth 3 (three) times daily as needed for muscle spasms., Disp: , Rfl:  .  famotidine (PEPCID) 20 MG tablet, Take 20 mg by mouth daily., Disp: , Rfl:  .  furosemide (LASIX) 20 MG tablet, Take 20 mg by mouth daily as needed for edema. , Disp: , Rfl:  .  losartan-hydrochlorothiazide (HYZAAR) 100-25 MG tablet, Take 1 tablet by mouth daily., Disp: 90 tablet, Rfl: 1 .  magnesium oxide (MAG-OX) 400 (241.3 Mg) MG tablet, Take 400 mg by mouth daily. , Disp: ,  Rfl:  .  meloxicam (MOBIC) 7.5 MG tablet, Take 7.5 mg by mouth daily. , Disp: , Rfl:  .  methocarbamol (ROBAXIN) 500 MG tablet, Take 1-2 tablets (500-1,000 mg total) by mouth every 8 (eight) hours as needed for muscle spasms., Disp: 30 tablet, Rfl: 2 .  oxybutynin (DITROPAN-XL) 10 MG 24 hr tablet, TAKE ONE (1) TABLET EACH DAY, Disp: 90 tablet, Rfl: 0 .  SYNTHROID 112 MCG tablet, Take 112 mcg by mouth daily before breakfast. , Disp: , Rfl:  .  triamcinolone cream (KENALOG) 0.1 %, Apply 1 application topically daily as needed., Disp: , Rfl:   Allergies  Allergen Reactions  . Mirapex [Pramipexole Dihydrochloride] Itching and Swelling  . Penicillins Rash  . Gabapentin Other (See Comments)    Couldn't move legs during the night after taking.  . Levothyroxine Itching   Past Medical History:  Diagnosis Date  . Cancer [facility])    breast   . Coronary artery disease due to lipid rich plaque 04/13/2016  . Diabetes mellitus without complication ([place])   . Diverticulosis of intestine without bleeding 12/13/2016  . History of right mastectomy 11/15/2014  . Hypothyroidism   . Overactive bladder   . Paget's disease of the bone   . Paroxysmal atrial fibrillation ([place]) 06/19/2014  . Restless leg syndrome     Observations/Objective: A&O  No respiratory distress or wheezing audible over the phone Mood, judgement, and thought processes all  WNL  Assessment and Plan: 1. Lower respiratory infection - predniSONE (STERAPRED UNI-PAK 21 TAB) 10 MG (21) TBPK tablet; As directed x 6 days  Dispense: 21 tablet; Refill: 0 - azithromycin (ZITHROMAX Z-PAK) 250 MG tablet; Take 2 tablets (500 mg) PO today, then 1 tablet (250 mg) PO daily x4 days.  Dispense: 6 tablet; Refill: 0   Follow Up Instructions:  I discussed the assessment and treatment plan with the patient. The patient was provided an opportunity to ask questions and all were answered. The patient agreed with the plan and demonstrated an understanding of the  instructions.   The patient was advised to call back or seek an in-person evaluation if the symptoms worsen or if the condition fails to improve as anticipated.  The above assessment and management plan was discussed with the patient. The patient verbalized understanding of and has agreed to the management plan. Patient is aware to call the clinic if symptoms persist or worsen. Patient is aware when to return to the clinic for a follow-up visit. Patient educated on when it is appropriate to go to the emergency department.   Time call ended: 3:30 PM  I provided 12 minutes of non-face-to-face time during this encounter.  Hendricks Limes, MSN, APRN, FNP-C [facility] 08/22/19

## 2019-08-29 ENCOUNTER — Telehealth: Payer: Self-pay | Admitting: Cardiology

## 2019-08-29 ENCOUNTER — Other Ambulatory Visit: Payer: Self-pay

## 2019-08-29 ENCOUNTER — Emergency Department (HOSPITAL_COMMUNITY)
Admission: EM | Admit: 2019-08-29 | Discharge: 2019-08-29 | Disposition: A | Discharge status: Home / Self Care | Payer: Medicare Other | Attending: Emergency Medicine | Admitting: Emergency Medicine

## 2019-08-29 ENCOUNTER — Encounter (HOSPITAL_COMMUNITY): Payer: Self-pay | Admitting: Emergency Medicine

## 2019-08-29 ENCOUNTER — Emergency Department (HOSPITAL_COMMUNITY): Payer: Medicare Other

## 2019-08-29 ENCOUNTER — Ambulatory Visit
Admission: EM | Admit: 2019-08-29 | Discharge: 2019-08-29 | Disposition: A | Discharge status: Home / Self Care | Payer: Medicare Other | Source: Home / Self Care

## 2019-08-29 DIAGNOSIS — H539 Unspecified visual disturbance: Secondary | ICD-10-CM | POA: Insufficient documentation

## 2019-08-29 DIAGNOSIS — I10 Essential (primary) hypertension: Secondary | ICD-10-CM | POA: Insufficient documentation

## 2019-08-29 DIAGNOSIS — Z853 Personal history of malignant neoplasm of breast: Secondary | ICD-10-CM | POA: Insufficient documentation

## 2019-08-29 DIAGNOSIS — Z87891 Personal history of nicotine dependence: Secondary | ICD-10-CM | POA: Insufficient documentation

## 2019-08-29 DIAGNOSIS — E039 Hypothyroidism, unspecified: Secondary | ICD-10-CM | POA: Insufficient documentation

## 2019-08-29 DIAGNOSIS — H538 Other visual disturbances: Secondary | ICD-10-CM | POA: Diagnosis present

## 2019-08-29 DIAGNOSIS — R55 Syncope and collapse: Secondary | ICD-10-CM | POA: Diagnosis not present

## 2019-08-29 DIAGNOSIS — E119 Type 2 diabetes mellitus without complications: Secondary | ICD-10-CM | POA: Diagnosis not present

## 2019-08-29 LAB — COMPREHENSIVE METABOLIC PANEL
ALT: 17 U/L (ref 0–44)
AST: 17 U/L (ref 15–41)
Albumin: 3.7 g/dL (ref 3.5–5.0)
Alkaline Phosphatase: 87 U/L (ref 38–126)
Anion gap: 10 (ref 5–15)
BUN: 23 mg/dL (ref 8–23)
CO2: 28 mmol/L (ref 22–32)
Calcium: 9.3 mg/dL (ref 8.9–10.3)
Chloride: 100 mmol/L (ref 98–111)
Creatinine, Ser: 0.84 mg/dL (ref 0.44–1.00)
GFR calc Af Amer: >60 mL/min (ref >60)
GFR calc non Af Amer: >60 mL/min (ref >60)
Glucose, Bld: 103 mg/dL — ABNORMAL HIGH (ref 70–99)
Potassium: 3.9 mmol/L (ref 3.5–5.1)
Sodium: 138 mmol/L (ref 135–145)
Total Bilirubin: 0.4 mg/dL (ref 0.3–1.2)
Total Protein: 7.1 g/dL (ref 6.5–8.1)

## 2019-08-29 LAB — CBC WITH DIFFERENTIAL/PLATELET
Abs Immature Granulocytes: 0.02 10*3/uL (ref 0.00–0.07)
Basophils Absolute: 0 10*3/uL (ref 0.0–0.1)
Basophils Relative: 1 %
Eosinophils Absolute: 0.1 10*3/uL (ref 0.0–0.5)
Eosinophils Relative: 1 %
HCT: 37.4 % (ref 36.0–46.0)
Hemoglobin: 11.5 g/dL — ABNORMAL LOW (ref 12.0–15.0)
Immature Granulocytes: 0 %
Lymphocytes Relative: 43 %
Lymphs Abs: 3.3 10*3/uL (ref 0.7–4.0)
MCH: 25.4 pg — ABNORMAL LOW (ref 26.0–34.0)
MCHC: 30.7 g/dL (ref 30.0–36.0)
MCV: 82.6 fL (ref 80.0–100.0)
Monocytes Absolute: 0.6 10*3/uL (ref 0.1–1.0)
Monocytes Relative: 8 %
Neutro Abs: 3.7 10*3/uL (ref 1.7–7.7)
Neutrophils Relative %: 47 %
Platelets: 401 10*3/uL — ABNORMAL HIGH (ref 150–400)
RBC: 4.53 MIL/uL (ref 3.87–5.11)
RDW: 15.4 % (ref 11.5–15.5)
WBC: 7.8 10*3/uL (ref 4.0–10.5)
nRBC: 0 % (ref 0.0–0.2)

## 2019-08-29 MED ORDER — TETRACAINE HCL 0.5 % OP SOLN
2.0000 [drp] | Freq: Once | OPHTHALMIC | Status: AC
  Administered 2019-08-29: 2 [drp] via OPHTHALMIC
  Filled 2019-08-29: qty 4

## 2019-08-29 MED ORDER — FLUORESCEIN SODIUM 1 MG OP STRP
1.0000 | ORAL_STRIP | Freq: Once | OPHTHALMIC | Status: AC
  Administered 2019-08-29: 1 via OPHTHALMIC
  Filled 2019-08-29: qty 1

## 2019-08-29 NOTE — Discharge Instructions (Signed)
You were seen in the emergency department today with blurry vision in the left eye.  Your eye pressures were normal.  We did an MRI which did not show any evidence of stroke.  Please call the ophthalmologist listed to schedule the next available follow-up appointment.  If you develop eye pain, worsening vision, headache, weakness/numbness you should return to the emergency department immediately for evaluation.

## 2019-08-29 NOTE — ED Triage Notes (Signed)
Pt presents with complaints of sudden blurry vision in her left eye that started on Sunday.

## 2019-08-29 NOTE — ED Triage Notes (Signed)
Pt presents with complaints of sudden blurry vision in her left eye that started on Sunday. Pt states it has not improved so she wanted to get seen. Pt reports having blood pressure issues. No further neuro deficits presents during intake. Per [place] the patient should be seen in the ER for further evaluation. Pt verbalized understanding. Family member will take patient to the ED.

## 2019-08-29 NOTE — Telephone Encounter (Signed)
Patient walked into the office stating that she went to [facility] today for an MRI. States that the heart monitor has caused irritation to her chest area. Was told by Forestine Na to come to our office to get it checked.

## 2019-08-29 NOTE — ED Notes (Signed)
To radiology

## 2019-08-29 NOTE — ED Provider Notes (Signed)
Emergency Department Provider Note   I have reviewed the triage vital signs and the nursing notes.   HISTORY  Chief Complaint Blurred Vision   HPI Desiree Bowers is a 74 y.o. female with past history reviewed below presents to the emergency department with acute onset left blurry vision.  Patient developed symptoms 4 days ago.  She has had 2 episodes of near syncope and has seen Dr. Harl Bowie as well as her primary care doctor regarding the symptoms.  She denies any changes to medications.  She felt somewhat lightheaded on Sunday but that has resolved.  She noticed blurry vision after this episode which seems isolated to the left eye.  She denies pain in the eye.  She does not wear contact lenses.  She reports a history of prediabetes but is not on medications.  She thought that symptoms would resolve that when they did not she presented to urgent care this morning and was redirected to the emergency department.  She denies any speech change, difficulty swallowing, unilateral weakness/numbness. No additional near syncope changes since.   Past Medical History:  Diagnosis Date  . Cancer [facility])    breast   . Coronary artery disease due to lipid rich plaque 04/13/2016  . Diabetes mellitus without complication ([place])   . Diverticulosis of intestine without bleeding 12/13/2016  . History of right mastectomy 11/15/2014  . Hypothyroidism   . Overactive bladder   . Paget's disease of the bone   . Paroxysmal atrial fibrillation ([place]) 06/19/2014  . Restless leg syndrome     Patient Active Problem List   Diagnosis Date Noted  . Restless leg syndrome 06/22/2019  . Seasonal allergies 12/21/2018  . Lymphedema of right arm 08/08/2017  . Facet degeneration of lumbar region 08/04/2017  . Overactive bladder 12/13/2016  . 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 Primary osteoarthritis involving multiple joints 11/15/2014  . Acquired hypothyroidism 11/15/2014  . Paroxysmal atrial  fibrillation ([place]) 06/19/2014  . Gastroesophageal reflux disease without esophagitis 04/04/2014  . GAD (generalized anxiety disorder) 04/04/2014  . ANA positive 01/18/2014  . 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 Essential (primary) hypertension 09/24/2013    Past Surgical History:  Procedure Laterality Date  . ABDOMINAL HYSTERECTOMY  1999  . APPENDECTOMY     as a child  . CATARACT EXTRACTION, BILATERAL    . MASTECTOMY Right     Allergies Mirapex [pramipexole dihydrochloride], Penicillins, Gabapentin, and Levothyroxine  Family History  Problem Relation Age of Onset  . Breast cancer Mother   . Colon cancer Father   . Kidney cancer Father   . Bladder Cancer Sister   . Suicidality Brother   . Heart attack Maternal Grandmother   . Breast cancer Sister   . Stomach cancer Sister   . Ovarian cancer Sister   . Breast cancer Sister   . Breast cancer Sister   . Diabetes Sister   . Other Brother        parathyroid disorder; Engelmann's disease  . Angelman syndrome Son   . Graves' disease Daughter     Social History Social History   Tobacco Use  . Smoking status: Former Research scientist (life sciences)  . Smokeless tobacco: Never Used  Substance Use Topics  . Alcohol use: No  . Drug use: No    Review of Systems  Constitutional: No fever/chills Eyes: Positive left eye vision change.  ENT: No sore throat. Cardiovascular: Denies chest pain. Respiratory: Denies shortness of breath. Gastrointestinal: No abdominal pain.  No  nausea, no vomiting.  No diarrhea.  No constipation. Genitourinary: Negative for dysuria. Musculoskeletal: Negative for back pain. Skin: Negative for rash. Neurological: Negative for headaches, focal weakness or numbness.  10-point ROS otherwise negative.  ____________________________________________   PHYSICAL EXAM:  VITAL SIGNS: ED Triage Vitals [08/29/19 1050]  Enc Vitals Group     BP (!) 173/69     Pulse Rate 71     Resp 18     Temp 98.2 F (36.8 C)     Temp  Source Oral     SpO2 98 %     Weight 180 lb (81.6 kg)     Height 5\' 5"  (1.651 m)   Constitutional: Alert and oriented. Well appearing and in no acute distress. Eyes: Conjunctivae are normal. PERRL. EOMI. No visual field deficits. IOP: R 15 and L 14. No corneal abrasions on fluorescein staining.  Head: Atraumatic. Nose: No congestion/rhinnorhea. Mouth/Throat: Mucous membranes are moist.   Neck: No stridor.  Cardiovascular: Normal rate, regular rhythm. Good peripheral circulation. Grossly normal heart sounds.   Respiratory: Normal respiratory effort.  No retractions. Lungs CTAB. Gastrointestinal: Soft and nontender. No distention.  Musculoskeletal: No lower extremity tenderness nor edema. No gross deformities of extremities. Neurologic:  Normal speech and language.  No facial asymmetry.  Normal finger-to-nose testing.  Normal strength and sensation in the bilateral upper and lower extremities. Skin:  Skin is warm, dry and intact. No rash noted.   ____________________________________________   LABS (all labs ordered are listed, but only abnormal results are displayed)  Labs Reviewed  COMPREHENSIVE METABOLIC PANEL - Abnormal; Notable for the following components:      Result Value   Glucose, Bld 103 (*)    All other components within normal limits  CBC WITH DIFFERENTIAL/PLATELET - Abnormal; Notable for the following components:   Hemoglobin 11.5 (*)    MCH 25.4 (*)    Platelets 401 (*)    All other components within normal limits   ____________________________________________  EKG   EKG Interpretation  Date/Time:  Wednesday August 29 2019 11:10:56 EDT Ventricular Rate:  68 PR Interval:    QRS Duration: 90 QT Interval:  388 QTC Calculation: 413 R Axis:   19 Text Interpretation: Sinus rhythm Abnormal R-wave progression, early transition No STEMI Confirmed by Nanda Quinton [phone]) on 08/29/2019 11:57:39 AM        ____________________________________________  RADIOLOGY  MR BRAIN WO CONTRAST  Result Date: 08/29/2019 CLINICAL DATA:  Left blurry vision EXAM: MRI HEAD WITHOUT CONTRAST TECHNIQUE: Multiplanar, multiecho pulse sequences of the brain and surrounding structures were obtained without intravenous contrast. COMPARISON:  None. FINDINGS: Brain: There is no acute infarction or intracranial hemorrhage. There is no intracranial mass, mass effect, or edema. There is no hydrocephalus or extra-axial fluid collection. Ventricles and sulci are within normal limits in size and configuration. Patchy confluent areas of T2 hyperintensity in the supratentorial white matter are nonspecific but probably reflect mild to moderate chronic microvascular ischemic changes. Vascular: Major vessel flow voids at the skull base are preserved. Skull and upper cervical spine: Normal marrow signal is preserved. Sinuses/Orbits: Minor mucosal thickening. Bilateral lens replacements. Other: Sella is unremarkable.  Mastoid air cells are clear. IMPRESSION: No evidence of recent infarction, hemorrhage mass. Mild to moderate chronic microvascular ischemic changes. Electronically Signed   By: Macy Mis M.D.   On: 08/29/2019 12:29    ____________________________________________   PROCEDURES  Procedure(s) performed:   Procedures  None  ____________________________________________   INITIAL IMPRESSION / ASSESSMENT AND PLAN /  ED COURSE  Pertinent labs & imaging results that were available during my care of the patient were reviewed by me and considered in my medical decision making (see chart for details).   Patient presents to the emergency department with left eye blurry vision starting 4 days ago.  No pain.  Conjunctive are normal.  Extraocular movements are normal.  Blurry vision does seem isolated to the left eye.  Plan for MRI brain to rule out central process.    MRI brain negative for acute process.  Intraocular  pressures are normal.  Fluorescein staining shows no corneal abrasions.  Plan for outpatient ophthalmology follow-up.  Discussed ED return precautions. ____________________________________________  FINAL CLINICAL IMPRESSION(S) / ED DIAGNOSES  Final diagnoses:  Vision changes    MEDICATIONS GIVEN DURING THIS VISIT:  Medications  fluorescein ophthalmic strip 1 strip (1 strip Both Eyes Given 08/29/19 1233)  tetracaine (PONTOCAINE) 0.5 % ophthalmic solution 2 drop (2 drops Both Eyes Given 08/29/19 1233)    Note:  This document was prepared using Dragon voice recognition software and may include unintentional dictation errors.  Nanda Quinton, MD, [facility] Emergency Medicine    Jahmire Ruffins, [facility], MD 08/29/19 [phone]

## 2019-08-29 NOTE — Telephone Encounter (Signed)
Redness to med sternum, site where old body guard mini patch removed. Advised that new patch could be put in a different position until redness resolves. New patch placed in horizontal position above left breast and advised to rotate sites to reduce skin irritation. Verbalized understanding.

## 2019-08-30 ENCOUNTER — Ambulatory Visit (INDEPENDENT_AMBULATORY_CARE_PROVIDER_SITE_OTHER): Payer: Medicare Other | Admitting: Family Medicine

## 2019-08-30 ENCOUNTER — Encounter: Payer: Self-pay | Admitting: Family Medicine

## 2019-08-30 DIAGNOSIS — R059 Cough, unspecified: Secondary | ICD-10-CM

## 2019-08-30 DIAGNOSIS — H538 Other visual disturbances: Secondary | ICD-10-CM | POA: Diagnosis not present

## 2019-08-30 DIAGNOSIS — R05 Cough: Secondary | ICD-10-CM | POA: Diagnosis not present

## 2019-08-30 MED ORDER — BENZONATATE 200 MG PO CAPS: 200.0000 mg | ORAL_CAPSULE | Freq: Three times a day (TID) | ORAL | 0 refills | Status: DC | PRN

## 2019-08-30 NOTE — Progress Notes (Signed)
Virtual Visit via Telephone Note  I connected with Desiree Bowers on 08/30/19 at 1:22 PM by telephone and verified that I am speaking with the correct person using two identifiers. Desiree Bowers is currently located at home and nobody is currently with her during this visit. The provider, Loman Brooklyn, FNP is located in their home at time of visit.  I discussed the limitations, risks, security and privacy concerns of performing an evaluation and management service by telephone and the availability of in person appointments. I also discussed with the patient that there may be a patient responsible charge related to this service. The patient expressed understanding and agreed to proceed.  Subjective: PCP: Loman Brooklyn, FNP  Chief Complaint  Patient presents with  . Hospitalization Follow-up  . Cough   Patient was seen at [facility] yesterday due to blurred vision. MRI of the brain was negative for any acute process. Intraocular pressures were normal. Fluorescein staining shows no corneal abrasions. It was recommended she follow-up with ophthalmology. She reports she has an appointment with Dr. Kathlen Mody in [place] in the morning. She denies any changes since yesterday.   She is also concerned that she has a lingering cough from a respiratory infection earlier this month. She takes Nyquil at night which is helpful.    ROS: Per HPI  Current Outpatient Medications:  .  aspirin EC 81 MG tablet, Take 81 mg by mouth daily., Disp: , Rfl:  .  benzonatate (TESSALON) 200 MG capsule, Take 1 capsule (200 mg total) by mouth 3 (three) times daily as needed for cough., Disp: 30 capsule, Rfl: 0 .  calcium carbonate (OSCAL) 1500 (600 Ca) MG TABS tablet, Take 2 tablets by mouth daily. , Disp: , Rfl:  .  cetirizine (ZYRTEC) 10 MG tablet, Take 1 tablet (10 mg total) by mouth daily., Disp: 90 tablet, Rfl: 1 .  cyclobenzaprine (FLEXERIL) 10 MG tablet, Take 10 mg by mouth 3 (three) times daily as  needed for muscle spasms., Disp: , Rfl:  .  famotidine (PEPCID) 20 MG tablet, Take 20 mg by mouth daily., Disp: , Rfl:  .  furosemide (LASIX) 20 MG tablet, Take 20 mg by mouth daily as needed for edema. , Disp: , Rfl:  .  losartan-hydrochlorothiazide (HYZAAR) 100-25 MG tablet, Take 1 tablet by mouth daily., Disp: 90 tablet, Rfl: 1 .  magnesium oxide (MAG-OX) 400 (241.3 Mg) MG tablet, Take 400 mg by mouth daily. , Disp: , Rfl:  .  meloxicam (MOBIC) 7.5 MG tablet, Take 7.5 mg by mouth daily. , Disp: , Rfl:  .  methocarbamol (ROBAXIN) 500 MG tablet, Take 1-2 tablets (500-1,000 mg total) by mouth every 8 (eight) hours as needed for muscle spasms., Disp: 30 tablet, Rfl: 2 .  oxybutynin (DITROPAN-XL) 10 MG 24 hr tablet, TAKE ONE (1) TABLET EACH DAY, Disp: 90 tablet, Rfl: 0 .  SYNTHROID 112 MCG tablet, Take 112 mcg by mouth daily before breakfast. , Disp: , Rfl:  .  triamcinolone cream (KENALOG) 0.1 %, Apply 1 application topically daily as needed., Disp: , Rfl:   Allergies  Allergen Reactions  . Mirapex [Pramipexole Dihydrochloride] Itching and Swelling  . Penicillins Rash  . Gabapentin Other (See Comments)    Couldn't move legs during the night after taking.  . Levothyroxine Itching   Past Medical History:  Diagnosis Date  . Cancer [facility])    breast   . Coronary artery disease due to lipid rich plaque 04/13/2016  .  Diabetes mellitus without complication ([place])   . Diverticulosis of intestine without bleeding 12/13/2016  . History of right mastectomy 11/15/2014  . Hypothyroidism   . Overactive bladder   . Paget's disease of the bone   . Paroxysmal atrial fibrillation ([place]) 06/19/2014  . Restless leg syndrome     Observations/Objective: A&O  No respiratory distress or wheezing audible over the phone Mood, judgement, and thought processes all WNL  Assessment and Plan: 1. Blurred vision - Keep appointment with ophthalmology in the morning.   2. Cough - Encouraged to use Robitussin or  Delsym for cough as needed.  - benzonatate (TESSALON) 200 MG capsule; Take 1 capsule (200 mg total) by mouth 3 (three) times daily as needed for cough.  Dispense: 30 capsule; Refill: 0   Follow Up Instructions:  I discussed the assessment and treatment plan with the patient. The patient was provided an opportunity to ask questions and all were answered. The patient agreed with the plan and demonstrated an understanding of the instructions.   The patient was advised to call back or seek an in-person evaluation if the symptoms worsen or if the condition fails to improve as anticipated.  The above assessment and management plan was discussed with the patient. The patient verbalized understanding of and has agreed to the management plan. Patient is aware to call the clinic if symptoms persist or worsen. Patient is aware when to return to the clinic for a follow-up visit. Patient educated on when it is appropriate to go to the emergency department.   Time call ended: 1:28 PM  I provided 8 minutes of non-face-to-face time during this encounter.  Hendricks Limes, MSN, APRN, FNP-C [facility] 08/30/19

## 2019-08-31 ENCOUNTER — Telehealth: Payer: Self-pay | Admitting: Cardiology

## 2019-08-31 NOTE — Telephone Encounter (Signed)
Stated that she has got it connected now.  Suggested that if she has any further problems to contact customer service number on her box.  She verbalized understanding.

## 2019-08-31 NOTE — Telephone Encounter (Signed)
Having issues with her leads to her monitor

## 2019-09-12 ENCOUNTER — Other Ambulatory Visit: Payer: Self-pay | Admitting: *Deleted

## 2019-09-12 ENCOUNTER — Telehealth: Payer: Self-pay | Admitting: Family Medicine

## 2019-09-12 MED ORDER — LOSARTAN POTASSIUM-HCTZ 100-25 MG PO TABS: 1.0000 | ORAL_TABLET | Freq: Every day | ORAL | 1 refills | Status: DC

## 2019-09-12 NOTE — Telephone Encounter (Signed)
  Prescription Request  09/12/2019  What is the name of the medication or equipment? losartan-hydrochlorothiazide (HYZAAR) 100-25 MG tablet   Have you contacted your pharmacy to request a refill? (if applicable) Yes  Which pharmacy would you like this sent to? [facility]   Patient notified that their request is being sent to the clinical staff for review and that they should receive a response within 2 business days.

## 2019-09-19 ENCOUNTER — Encounter (INDEPENDENT_AMBULATORY_CARE_PROVIDER_SITE_OTHER): Payer: Self-pay | Admitting: Ophthalmology

## 2019-09-19 ENCOUNTER — Ambulatory Visit (INDEPENDENT_AMBULATORY_CARE_PROVIDER_SITE_OTHER): Payer: Medicare Other | Admitting: Ophthalmology

## 2019-09-19 DIAGNOSIS — H3582 Retinal ischemia: Secondary | ICD-10-CM | POA: Diagnosis not present

## 2019-09-19 DIAGNOSIS — H35033 Hypertensive retinopathy, bilateral: Secondary | ICD-10-CM | POA: Diagnosis not present

## 2019-09-19 DIAGNOSIS — Z961 Presence of intraocular lens: Secondary | ICD-10-CM

## 2019-09-19 DIAGNOSIS — I10 Essential (primary) hypertension: Secondary | ICD-10-CM

## 2019-09-19 DIAGNOSIS — H3581 Retinal edema: Secondary | ICD-10-CM | POA: Diagnosis not present

## 2019-09-19 LAB — HM DIABETES EYE EXAM

## 2019-09-19 NOTE — Progress Notes (Signed)
[facility] Note  09/19/2019     CHIEF COMPLAINT Patient presents for Retina Evaluation   HISTORY OF PRESENT ILLNESS: Desiree Bowers is a 74 y.o. female who presents to the clinic today for:   HPI    Retina Evaluation    In left eye.  This started 3 weeks ago.  Duration of 3 weeks.  Context:  distance vision, mid-range vision and near vision.  Treatments tried include artificial tears.  Response to treatment was no improvement.  I, the attending physician,  performed the HPI with the patient and updated documentation appropriately.          Comments    74 y/o female pt referred by Dr. Parke Bowers about 1 wk ago for eval of CRAO OS.  Pt woke up about 3 wks ago with sudden onset decreased vision OS.  No obvious cause.  Denies pain, FOL, floaters.  Pt was referred to Dr. Parke Bowers from another eye doctor.  VA OS has been constantly blurred since.  Pt is pre-diabetic.  BS this a.m. 133.  A1C 6.0.  AT prn OU.  Pt feels vision OS has been worse than VA OD ever since cat sx in fall of 2013.       Last edited by Desiree Caffey, MD on 09/19/2019 11:14 PM. (History)    pt states she saw Dr. Parke Bowers last week bc she had a sudden decrease in vision OU, she states she woke up one morning and was sick and had very blurry vision that stayed blurry the rest of the day, she states her blood pressure has been dropping (95/45) causing her to be dizzy and unable to stand, pt states this was her first visit with Dr. Parke Bowers, she does not have a regular eye dr, but Dr. Dolores Lory did her cataract sx in 2013, pt states she still cannot read without readers which is not normal for her, she states prior to the episode of low blood pressure / blurred vision she could read books and see her phone without glasses, pt was seen at [facility] ED after second episode of low blood pressure, she states she did not have a stroke as suspected, she went back to the ED a few days later to see why she was  unable to see, pt states she feels like her vision is better today than it was in [facility] office, pt states she takes medication for high blood pressure, but not for diabetes  Referring physician: Demarco, Bowers, Desiree Bowers,  Desiree Bowers [id number]  HISTORICAL INFORMATION:   Selected notes from the MEDICAL RECORD NUMBER Referred by Dr. Parke Bowers for retina eval, concern for RAO OS   CURRENT MEDICATIONS: No current outpatient medications on file. (Ophthalmic Drugs)   No current facility-administered medications for this visit. (Ophthalmic Drugs)   Current Outpatient Medications (Other)  Medication Sig  . aspirin EC 81 MG tablet Take 81 mg by mouth daily.  . benzonatate (TESSALON) 200 MG capsule Take 1 capsule (200 mg total) by mouth 3 (three) times daily as needed for cough.  . calcium carbonate (OSCAL) 1500 (600 Ca) MG TABS tablet Take 2 tablets by mouth daily.   . cetirizine (ZYRTEC) 10 MG tablet Take 1 tablet (10 mg total) by mouth daily.  . cyclobenzaprine (FLEXERIL) 10 MG tablet Take 10 mg by mouth 3 (three) times daily as needed for muscle spasms.  . famotidine (PEPCID) 20 MG tablet Take 20 mg by mouth daily.  Marland Kitchen  furosemide (LASIX) 20 MG tablet Take 20 mg by mouth daily as needed for edema.   Marland Kitchen losartan-hydrochlorothiazide (HYZAAR) 100-25 MG tablet Take 1 tablet by mouth daily.  . magnesium oxide (MAG-OX) 400 (241.3 Mg) MG tablet Take 400 mg by mouth daily.   . meloxicam (MOBIC) 7.5 MG tablet Take 7.5 mg by mouth daily.   . methocarbamol (ROBAXIN) 500 MG tablet Take 1-2 tablets (500-1,000 mg total) by mouth every 8 (eight) hours as needed for muscle spasms.  Marland Kitchen oxybutynin (DITROPAN-XL) 10 MG 24 hr tablet TAKE ONE (1) TABLET EACH DAY  . SYNTHROID 100 MCG tablet Take 100 mcg by mouth daily.  Marland Kitchen SYNTHROID 112 MCG tablet Take 112 mcg by mouth daily before breakfast.   . triamcinolone cream (KENALOG) 0.1 % Apply 1 application topically daily as needed.   No current  facility-administered medications for this visit. (Other)      REVIEW OF SYSTEMS: ROS    Positive for: Gastrointestinal, Musculoskeletal, Endocrine, Cardiovascular, Eyes   Negative for: Constitutional, Neurological, Skin, Genitourinary, HENT, Respiratory, Psychiatric, Allergic/Imm, Heme/Lymph   Last edited by [name], COA on 09/19/2019  8:15 AM. (History)       ALLERGIES Allergies  Allergen Reactions  . Mirapex [Pramipexole Dihydrochloride] Itching and Swelling  . Penicillins Rash  . Gabapentin Other (See Comments)    Couldn't move legs during the night after taking.  . Levothyroxine Itching    PAST MEDICAL HISTORY Past Medical History:  Diagnosis Date  . Cancer [facility])    breast   . Coronary artery disease due to lipid rich plaque 04/13/2016  . Diabetes mellitus without complication ([place])   . Diverticulosis of intestine without bleeding 12/13/2016  . History of right mastectomy 11/15/2014  . Hypothyroidism   . Overactive bladder   . Paget's disease of the bone   . Paroxysmal atrial fibrillation ([place]) 06/19/2014  . Restless leg syndrome    Past Surgical History:  Procedure Laterality Date  . ABDOMINAL HYSTERECTOMY  1999  . APPENDECTOMY     as a child  . CATARACT EXTRACTION Bilateral Fall 2013   Dr. Charise Killian  . CATARACT EXTRACTION, BILATERAL    . EYE SURGERY Bilateral Fall 2013   Cat Sx - Dr. Charise Killian  . MASTECTOMY Right     FAMILY HISTORY Family History  Problem Relation Age of Onset  . Breast cancer Mother   . Colon cancer Father   . Kidney cancer Father   . Bladder Cancer Sister   . Suicidality Brother   . Heart attack Maternal Grandmother   . Breast cancer Sister   . Stomach cancer Sister   . Ovarian cancer Sister   . Breast cancer Sister   . Breast cancer Sister   . Diabetes Sister   . Other Brother        parathyroid disorder; Engelmann's disease  . Angelman syndrome Son   . Graves' disease Daughter     SOCIAL HISTORY Social History   Tobacco  Use  . Smoking status: Former Research scientist (life sciences)  . Smokeless tobacco: Never Used  Substance Use Topics  . Alcohol use: No  . Drug use: No         OPHTHALMIC EXAM:  Base Eye Exam    Visual Acuity (Snellen - Linear)      Right Left   Dist [place] 20/30 20/50 -   Dist ph [place] 20/25 20/25       Tonometry (Tonopen, 8:17 AM)      Right Left  Pressure 12 12       Pupils      Dark Light Shape React APD   Right 4 4 Round Minimal None   Left 3 2 Round Minimal None       Visual Fields (Counting fingers)      Left Right    Full Full       Extraocular Movement      Right Left    Full, Ortho Full, Ortho       Neuro/Psych    Oriented x3: Yes   Mood/Affect: Normal       Dilation    Both eyes: 1.0% Mydriacyl, 2.5% Phenylephrine @ 8:17 AM        Slit Lamp and Fundus Exam    Slit Lamp Exam      Right Left   Lids/Lashes Dermatochalasis - upper lid, Meibomian gland dysfunction, Telangiectasia Dermatochalasis - upper lid, Meibomian gland dysfunction, Telangiectasia   Conjunctiva/Sclera White and quiet, heavy pigmentation superior hemisphere White and quiet   Cornea Arcus, 1+ Punctate epithelial erosions, temporal cataract wounds with mild corneal haze Arcus, 1-2+ Punctate epithelial erosions, well healed temporal cataract wounds   Anterior Chamber Deep and quiet Deep and quiet   Iris Round and dilated, No NVI Round and dilated, No NVI   Lens PC IOL in good position PC IOL in good position   Vitreous Vitreous syneresis Vitreous syneresis       Fundus Exam      Right Left   Disc Pink and Sharp Pink and Sharp   C/D Ratio 0.5 0.2   Macula Flat, Good foveal reflex, trace Epiretinal membrane, mild Retinal pigment epithelial mottling, No heme or edema Flat, Blunted foveal reflex, mild Retinal pigment epithelial mottling, No heme or edema   Vessels Mild Vascular attenuation, Tortuousity Mild Vascular attenuation, mild Tortuousity, mild AV crossing changes   Periphery Attached, mild peripheral  drusen Attached, scattered peripheral drusen, focal, patchy RPE changes nasal periphery        Refraction    Manifest Refraction      Sphere Cylinder Axis Dist VA   Right [place] Sphere  20/30   Left +0.50 +0.25 180 20/40          IMAGING AND PROCEDURES  Imaging and Procedures for @TODAY @  OCT, Retina - OU - Both Eyes       Right Eye Quality was good. Central Foveal Thickness: 272. Progression has no prior data. Findings include normal foveal contour, no SRF, no IRF (Rare drusen and peripheral ORA).   Left Eye Quality was good. Central Foveal Thickness: 272. Progression has no prior data. Findings include normal foveal contour, no IRF, no SRF (Rare drusen, irregular RPE contour (peripheral > posteriorly)).   Notes *Images captured and stored on drive  Diagnosis / Impression:  NFP, no IRF/SRF OU OD: Rare drusen and peripheral ORA OS: Rare drusen, irregular RPE contour (peripheral > posteriorly)  Clinical management:  See below  Abbreviations: NFP - Normal foveal profile. CME - cystoid macular edema. PED - pigment epithelial detachment. IRF - intraretinal fluid. SRF - subretinal fluid. EZ - ellipsoid zone. ERM - epiretinal membrane. ORA - outer retinal atrophy. ORT - outer retinal tubulation. SRHM - subretinal hyper-reflective material        Fluorescein Angiography Optos (Transit OS)       Right Eye   Progression has no prior data. Early phase findings include staining. Mid/Late phase findings include staining.   Left Eye   Progression has no  prior data. Early phase findings include delayed filling, staining. Mid/Late phase findings include staining.   Notes **Images stored on drive**  Impression: OD: scattered non-specific focal, peripheral staining -- otherwise normal study OS: delayed filling time -- early ocular ischemia / impending CRAO?                  ASSESSMENT/PLAN:    ICD-10-CM   1. Retinal ischemia  H35.82   2. Retinal edema   H35.81 OCT, Retina - OU - Both Eyes  3. Essential hypertension  I10   4. Hypertensive retinopathy of both eyes  H35.033 Fluorescein Angiography Optos (Transit OS)  5. Pseudophakia of both eyes  Z96.1    1,2. Ocular/retinal ischemia OS  - pt reports recent history of near syncopal episodes -- notably hypotensive during episodes  - pt awoke one morning last week with decreased vision OS following  - pt presented [facility] ED and had MRI brain to r/o stroke on 3.24 -- no recent infarction, hemorrhage or mass -- just microvascular ischemic changes  - BCVA today 20/25  - exam without any significant pathologic findings   - OCT without IRF/SRF but RPE contour quite irregular  - FA 4.14.21 shows severely delayed filling time -- ?ocular/retinal ischemia  - suspect near syncopal/hypotensive episodes + visual episodes may have vascular etiology  - as MRI was negative on 3.24.21, recommend cardiovascular work up including carotid dopplers and echocardiogram -- carotid dopplers to r/o treatable stenosis  - pt had event monitor placed  - may benefit from some labwork to r/o hypercoag risk factors (CBC on 3.24.21 w/ Platelets 401)  - discussed findings w/ patient and will send message to PCP and cardiology (Dr. Harl Bowie)  - f/u here 6-8 wks, sooner prn  3,4. Hypertensive retinopathy OU  - discussed importance of tight BP control  - monitor  5. Pseudophakia OU  - s/p CE/IOL (2013, Dr. Dolores Lory)  - beautiful surgery, doing well  - monitor     Ophthalmic Meds Ordered this visit:  No orders of the defined types were placed in this encounter.      Return for f/u 6-8 weeks, ocular ischemia OS, DFE, OCT.  There are no Patient Instructions on file for this visit.   Explained the diagnoses, plan, and follow up with the patient and they expressed understanding.  Patient expressed understanding of the importance of proper follow up care.   This document serves as a record of services personally  performed by Gardiner Sleeper, MD, PhD. It was created on their behalf by Ernest Mallick, OA, an ophthalmic assistant. The creation of this record is the provider's dictation and/or activities during the visit.    Electronically signed by: Ernest Mallick, OA 04.14.2021 8:24 AM   Gardiner Sleeper, M.D., Ph.D. Diseases & Surgery of the Retina and Vitreous [place]  I have reviewed the above documentation for accuracy and completeness, and I agree with the above. Gardiner Sleeper, M.D., Ph.D. 09/20/19 8:24 AM   Abbreviations: M myopia (nearsighted); A astigmatism; H hyperopia (farsighted); P presbyopia; Mrx spectacle prescription;  CTL contact lenses; OD right eye; OS left eye; OU both eyes  XT exotropia; ET esotropia; PEK punctate epithelial keratitis; PEE punctate epithelial erosions; DES dry eye syndrome; MGD meibomian gland dysfunction; ATs artificial tears; PFAT's preservative free artificial tears; [place] nuclear sclerotic cataract; PSC posterior subcapsular cataract; ERM epi-retinal membrane; PVD posterior vitreous detachment; RD retinal detachment; DM diabetes mellitus; DR diabetic retinopathy;  NPDR non-proliferative diabetic retinopathy; PDR proliferative diabetic retinopathy; CSME clinically significant macular edema; DME diabetic macular edema; dbh dot blot hemorrhages; CWS cotton wool spot; POAG primary open angle glaucoma; C/D cup-to-disc ratio; HVF humphrey visual field; GVF goldmann visual field; OCT optical coherence tomography; IOP intraocular pressure; BRVO Branch retinal vein occlusion; CRVO central retinal vein occlusion; CRAO central retinal artery occlusion; BRAO branch retinal artery occlusion; RT retinal tear; SB scleral buckle; PPV pars plana vitrectomy; VH Vitreous hemorrhage; PRP panretinal laser photocoagulation; IVK intravitreal kenalog; VMT vitreomacular traction; MH Macular hole;  NVD neovascularization of the disc; NVE neovascularization elsewhere; AREDS age  related eye disease study; ARMD age related macular degeneration; POAG primary open angle glaucoma; EBMD epithelial/anterior basement membrane dystrophy; ACIOL anterior chamber intraocular lens; IOL intraocular lens; PCIOL posterior chamber intraocular lens; Phaco/IOL phacoemulsification with intraocular lens placement; [place] photorefractive keratectomy; LASIK laser assisted in situ keratomileusis; HTN hypertension; DM diabetes mellitus; COPD chronic obstructive pulmonary disease

## 2019-09-24 ENCOUNTER — Ambulatory Visit (INDEPENDENT_AMBULATORY_CARE_PROVIDER_SITE_OTHER): Payer: Medicare Other | Admitting: Nurse Practitioner

## 2019-09-24 DIAGNOSIS — J40 Bronchitis, not specified as acute or chronic: Secondary | ICD-10-CM

## 2019-09-24 MED ORDER — PREDNISONE 10 MG (21) PO TBPK: ORAL_TABLET | ORAL | 0 refills | Status: DC

## 2019-09-24 NOTE — Progress Notes (Signed)
   Virtual Visit via telephone Note Due to COVID-19 pandemic this visit was conducted virtually. This visit type was conducted due to national recommendations for restrictions regarding the COVID-19 Pandemic (e.g. social distancing, sheltering in place) in an effort to limit this patient's exposure and mitigate transmission in our community. All issues noted in this document were discussed and addressed.  A physical exam was not performed with this format.  I connected with TAYLORANNE ZIMBELMAN on 09/24/19 at 1:05 by telephone and verified that I am speaking with the correct person using two identifiers. MOSE SELVIDGE is currently located at home and no one is currently with her during visit. The provider, Mary-Margaret Hassell Done, FNP is located in their office at time of visit.  I discussed the limitations, risks, security and privacy concerns of performing an evaluation and management service by telephone and the availability of in person appointments. I also discussed with the patient that there may be a patient responsible charge related to this service. The patient expressed understanding and agreed to proceed.   History and Present Illness:   Chief Complaint: Cough   HPI Patient calls in c/o cough that will not resolve. She had telephone visit with Hendricks Limes and she gave her a zpak and cough meds but is no better. She still continues to cough. nyquill helped some at first but is not helping now.      Review of Systems  Constitutional: Negative for chills and fever.  HENT: Negative.   Respiratory: Positive for cough (nonproductive).   Neurological: Negative for headaches.  Psychiatric/Behavioral: Negative.   All other systems reviewed and are negative.    Observations/Objective: Alert and oriented- answers all questions appropriately No distress Deep dry cough   Assessment and Plan: [name] in today with chief complaint of Cough   1. Bronchitis Force  fluids Rest mucinex OTC  Meds ordered this encounter  Medications  . predniSONE (STERAPRED UNI-PAK 21 TAB) 10 MG (21) TBPK tablet    Sig: As directed x 6 days    Dispense:  21 tablet    Refill:  0    Order Specific Question:   Supervising Provider    Answer:   Caryl Pina A [id number]        Follow Up Instructions: prn    I discussed the assessment and treatment plan with the patient. The patient was provided an opportunity to ask questions and all were answered. The patient agreed with the plan and demonstrated an understanding of the instructions.   The patient was advised to call back or seek an in-person evaluation if the symptoms worsen or if the condition fails to improve as anticipated.  The above assessment and management plan was discussed with the patient. The patient verbalized understanding of and has agreed to the management plan. Patient is aware to call the clinic if symptoms persist or worsen. Patient is aware when to return to the clinic for a follow-up visit. Patient educated on when it is appropriate to go to the emergency department.   Time call ended:  1:18  I provided 13 minutes of non-face-to-face time during this encounter.    Mary-Margaret Hassell Done, FNP

## 2019-09-27 ENCOUNTER — Telehealth (INDEPENDENT_AMBULATORY_CARE_PROVIDER_SITE_OTHER): Payer: Medicare Other | Admitting: Cardiology

## 2019-09-27 ENCOUNTER — Encounter: Payer: Self-pay | Admitting: Cardiology

## 2019-09-27 VITALS — BP 153/79 | HR 69 | Ht 65.0 in | Wt 180.0 lb

## 2019-09-27 DIAGNOSIS — I480 Paroxysmal atrial fibrillation: Secondary | ICD-10-CM

## 2019-09-27 DIAGNOSIS — H539 Unspecified visual disturbance: Secondary | ICD-10-CM | POA: Diagnosis not present

## 2019-09-27 DIAGNOSIS — Z87891 Personal history of nicotine dependence: Secondary | ICD-10-CM

## 2019-09-27 MED ORDER — METOPROLOL TARTRATE 25 MG PO TABS: 12.5000 mg | ORAL_TABLET | Freq: Two times a day (BID) | ORAL | 1 refills | Status: DC

## 2019-09-27 MED ORDER — APIXABAN 5 MG PO TABS: 5.0000 mg | ORAL_TABLET | Freq: Two times a day (BID) | ORAL | 3 refills | Status: DC

## 2019-09-27 NOTE — Progress Notes (Signed)
Virtual Visit via Telephone Note   This visit type was conducted due to national recommendations for restrictions regarding the COVID-19 Pandemic (e.g. social distancing) in an effort to limit this patient's exposure and mitigate transmission in our community.  Due to her co-morbid illnesses, this patient is at least at moderate risk for complications without adequate follow up.  This format is felt to be most appropriate for this patient at this time.  The patient did not have access to video technology/had technical difficulties with video requiring transitioning to audio format only (telephone).  All issues noted in this document were discussed and addressed.  No physical exam could be performed with this format.  Please refer to the patient's chart for her  consent to telehealth for [facility]   The patient was identified using 2 identifiers.  Date:  09/27/2019   ID:  Desiree Bowers, DOB [birth year], MRN [id number]  Patient Location: Home Provider Location: Office  PCP:  Loman Brooklyn, FNP  Cardiologist:  Carlyle Dolly, MD  Electrophysiologist:  None   Evaluation Performed:  Follow-Up Visit  Chief Complaint:  Follow up  History of Present Illness:    Desiree Bowers is a 74 y.o. female seen today as a new consult, referred by FNP Blanch Media for presyncope   1. Near syncope - ER visit 07/2019 with dizziness, near syncope - EKG shows SR, rare PACs  - occurred at home while cooking breakfast. While standing, sudden felt dizzy, nearly fell to floor. Does not any palpitations. Made way to kitchen chair, checked her bp and it was around 108/89, then SBPs in the 90s.  - called her daughter who is a Marine scientist. Layed down, better after 10 minutes - since that time mild near episode.  - drinks glasses of water x 2. Coffee 1 cup caffeinated. Occasional sodas.  - occasional orthostatlic symptoms.  - can have some occasional palpitations.    - from 08/2015 [facility] cardiology notes  she has a long history of tachycardia, only sinus tach has been identified - Dr Dion Body [facility] cards note Jan 2016 lists PAF in the problem list but in the A/P he reports he is obtaining a monitor b/c if she has PAF would have implications for anticoag - monitor from [facility] only NSR, occasional PACs.    2. PAF - this is listed in her chart however after chart review of her prior cardiologist notes the diagonsis was unclear. She had not been on anticoag previously.  - she herself is unaware of this diagnosis  - monitor ordered at last visit did show episodes of PAF    3. Elevated bp - on prednisone currently  4. Vision changes - seen in ER 08/29/19 with acute vision change - MRI without acute findings - seen by optho, diagnosed with retinal ischemia.  - optho has asked for echo and carotid dopplers.  - may have been related to her PAF which just diagnosed by monitor.   The patient does not have symptoms concerning for COVID-19 infection (fever, chills, cough, or new shortness of breath).    Past Medical History:  Diagnosis Date  . Cancer [facility])    breast   . Coronary artery disease due to lipid rich plaque 04/13/2016  . Diabetes mellitus without complication ([place])   . Diverticulosis of intestine without bleeding 12/13/2016  . History of right mastectomy 11/15/2014  . Hypothyroidism   . Overactive bladder   . Paget's disease of the bone   . Paroxysmal atrial  fibrillation ([place]) 06/19/2014  . Restless leg syndrome    Past Surgical History:  Procedure Laterality Date  . ABDOMINAL HYSTERECTOMY  1999  . APPENDECTOMY     as a child  . CATARACT EXTRACTION Bilateral Fall 2013   Dr. Charise Killian  . CATARACT EXTRACTION, BILATERAL    . EYE SURGERY Bilateral Fall 2013   Cat Sx - Dr. Charise Killian  . MASTECTOMY Right      Current Meds  Medication Sig  . aspirin EC 81 MG tablet Take 81 mg by mouth daily.  . benzonatate (TESSALON) 200 MG capsule Take 1 capsule (200 mg total) by mouth 3 (three)  times daily as needed for cough.  . calcium carbonate (OSCAL) 1500 (600 Ca) MG TABS tablet Take 2 tablets by mouth daily.   . cetirizine (ZYRTEC) 10 MG tablet Take 1 tablet (10 mg total) by mouth daily.  . cyclobenzaprine (FLEXERIL) 10 MG tablet Take 10 mg by mouth 3 (three) times daily as needed for muscle spasms.  . famotidine (PEPCID) 20 MG tablet Take 20 mg by mouth daily.  . furosemide (LASIX) 20 MG tablet Take 20 mg by mouth daily as needed for edema.   Marland Kitchen losartan-hydrochlorothiazide (HYZAAR) 100-25 MG tablet Take 1 tablet by mouth daily.  . magnesium oxide (MAG-OX) 400 (241.3 Mg) MG tablet Take 400 mg by mouth daily.   . meloxicam (MOBIC) 7.5 MG tablet Take 7.5 mg by mouth daily.   . methocarbamol (ROBAXIN) 500 MG tablet Take 1-2 tablets (500-1,000 mg total) by mouth every 8 (eight) hours as needed for muscle spasms.  Marland Kitchen oxybutynin (DITROPAN-XL) 10 MG 24 hr tablet TAKE ONE (1) TABLET EACH DAY  . pramipexole (MIRAPEX) 0.125 MG tablet Take 0.125 mg by mouth at bedtime.  . predniSONE (STERAPRED UNI-PAK 21 TAB) 10 MG (21) TBPK tablet As directed x 6 days  . sertraline (ZOLOFT) 25 MG tablet Take 25 mg by mouth daily.  Marland Kitchen SYNTHROID 112 MCG tablet Take 112 mcg by mouth daily before breakfast.   . triamcinolone cream (KENALOG) 0.1 % Apply 1 application topically daily as needed.     Allergies:   Mirapex [pramipexole dihydrochloride], Penicillins, Gabapentin, and Levothyroxine   Social History   Tobacco Use  . Smoking status: Former Research scientist (life sciences)  . Smokeless tobacco: Never Used  Substance Use Topics  . Alcohol use: No  . Drug use: No     Family Hx: The patient's family history includes Angelman syndrome in her son; Bladder Cancer in her sister; Breast cancer in her mother, sister, sister, and sister; Colon cancer in her father; Diabetes in her sister; Berenice Primas' disease in her daughter; Heart attack in her maternal grandmother; Kidney cancer in her father; Other in her brother; Ovarian cancer in  her sister; Stomach cancer in her sister; Suicidality in her brother.  ROS:   Please see the history of present illness.    All other systems reviewed and are negative.   Prior CV studies:   The following studies were reviewed today:  08/2015 cath [facility] FINDINGS:  Coronary Angiography 1. Left Main - Normal 2. Left anterior descending artery - the LAD gives off a small first diagonal and a large second diagonal, normal. 3. Diagonals - Normal 4. Left Circumflex - the circumflex gives off a large OM1, small OM 2, and moderately large OM 3, normal. 5. Obtuse Marginals - Normal 6. 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 7. Posterior Descending Artery - Normal  Additional comments on angiography: Right Dominance  1.Normal coronary arteries. 2.LVEDP 20 3.RRA access.There was spasm of the radial artery near the level of the elbow so access was changed from [place] to right femoral artery.RFA was closed with Perclose vascular suture device. 4. No Complications.   RECOMMENDATIONS:  1. Continue workup for etiology of current symptoms. 2. Followup with Dr. Hamilton Capri for with her PCP. 3. Continue risk factor modification  Labs/Other Tests and Data Reviewed:    EKG:  No ECG reviewed.  Recent Labs: 01/04/2019: Magnesium 2.0 08/29/2019: ALT 17; BUN 23; Creatinine, Ser 0.84; Hemoglobin 11.5; Platelets 401; Potassium 3.9; Sodium 138   Recent Lipid Panel No results found for: CHOL, TRIG, HDL, CHOLHDL, LDLCALC, LDLDIRECT  Wt Readings from Last 3 Encounters:  09/27/19 180 lb (81.6 kg)  08/29/19 180 lb (81.6 kg)  08/14/19 184 lb (83.5 kg)     Objective:    Vital Signs:  BP (!) 153/79   Pulse 69   Ht 5\' 5"  (1.651 m)   Wt 180 lb (81.6 kg)   BMI 29.95 kg/m    Normal affect. Normal speech pattern and tone. Comfortable, no apparent distress. No audible signs  of sob or wheezing.   ASSESSMENT & PLAN:    1.PAF - confirmed diagnosis by recent monitor, perhaps the etiology of her recent retinal ischemia - start lopressor 12.5mg  bid - stop ASA, start eliquis 5mg  bid. We discussed her talking with pcp about alternative to mobic since on eliquis - obtain echo   2. Vision change - seen by optho, signs of retinal ischemia. Perhaps related to newly diagnosed PAF - we will obtain carotid dopplers and echo    COVID-19 Education: The signs and symptoms of COVID-19 were discussed with the patient and how to seek care for testing (follow up with PCP or arrange E-visit).  The importance of social distancing was discussed today.  Time:   Today, I have spent 21 minutes with the patient with telehealth technology discussing the above problems.     Medication Adjustments/Labs and Tests Ordered: Current medicines are reviewed at length with the patient today.  Concerns regarding medicines are outlined above.   Tests Ordered: No orders of the defined types were placed in this encounter.   Medication Changes: No orders of the defined types were placed in this encounter.   Follow Up:  Either In Person or Virtual in 3 month(s)  Signed, Carlyle Dolly, MD  09/27/2019 9:39 AM    [place]

## 2019-09-27 NOTE — Patient Instructions (Signed)
Your physician recommends that you schedule a follow-up appointment in: [place] has recommended you make the following change in your medication:   STOP ASPIRIN   START ELIQUIS 5 MG TWICE DAILY   START LOPRESSOR 12.5 MG (1/2 TABLET) TWICE DAILY  Your physician has requested that you have an echocardiogram. Echocardiography is a painless test that uses sound waves to create images of your heart. It provides your doctor with information about the size and shape of your heart and how well your heart's chambers and valves are working. This procedure takes approximately one hour. There are no restrictions for this procedure.  Your physician has requested that you have a carotid duplex. This test is an ultrasound of the carotid arteries in your neck. It looks at blood flow through these arteries that supply the brain with blood. Allow one hour for this exam. There are no restrictions or special instructions.  Thank you for choosing [facility]!!

## 2019-09-27 NOTE — Addendum Note (Signed)
Addended by: Julian Hy T on: 09/27/2019 10:49 AM   Modules accepted: Orders

## 2019-09-28 ENCOUNTER — Telehealth: Payer: Self-pay | Admitting: Cardiology

## 2019-09-28 MED ORDER — METOPROLOL TARTRATE 25 MG PO TABS: 12.5000 mg | ORAL_TABLET | Freq: Two times a day (BID) | ORAL | 1 refills | Status: DC

## 2019-09-28 MED ORDER — APIXABAN 5 MG PO TABS: 5.0000 mg | ORAL_TABLET | Freq: Two times a day (BID) | ORAL | 1 refills | Status: DC

## 2019-09-28 NOTE — Telephone Encounter (Signed)
[facility] called stating the [facility] told her that she would have to pay $257.00 for a month supply of Eliquis. Patient states that she cannot afford.

## 2019-09-28 NOTE — Telephone Encounter (Signed)
Patient called stating that the medications she requested yesterday were suppose to be sent to the [facility].  She is leaving town on Sunday and will be gone a week. She is wanting to know if it will be ok to go without the medication.

## 2019-09-28 NOTE — Telephone Encounter (Signed)
Spoke with the Drug store and verbally gave voucher info for #30 day supply for free - pt will come by office and pick up pt assistance form

## 2019-10-04 NOTE — Telephone Encounter (Signed)
error 

## 2019-10-15 ENCOUNTER — Other Ambulatory Visit: Payer: Self-pay | Admitting: Family Medicine

## 2019-10-16 ENCOUNTER — Telehealth: Payer: Self-pay | Admitting: Cardiology

## 2019-10-16 MED ORDER — RIVAROXABAN 20 MG PO TABS: 20.0000 mg | ORAL_TABLET | Freq: Every day | ORAL | 6 refills | Status: DC

## 2019-10-16 NOTE — Telephone Encounter (Signed)
Fwd to provider

## 2019-10-16 NOTE — Telephone Encounter (Signed)
Patient called stating that she wants to switch Eliquis . States that every time she takes it her stomach swells up.

## 2019-10-16 NOTE — Telephone Encounter (Signed)
Can't afford Xalreto.  Asking if there is something a lot cheaper for her to take instead

## 2019-10-16 NOTE — Telephone Encounter (Signed)
Can stop eliquis, please start xarelto 20mg  daily   Zandra Abts MD

## 2019-10-16 NOTE — Telephone Encounter (Signed)
Patient notified.  Will [facility] prescription to [facility].  She will begin the Xarelto tomorrow evening at dinner time.

## 2019-10-17 MED ORDER — RIVAROXABAN 20 MG PO TABS: 20.0000 mg | ORAL_TABLET | Freq: Every day | ORAL | 0 refills | Status: DC

## 2019-10-17 NOTE — Telephone Encounter (Signed)
Advised that samples and patient assistance application is available for pick up

## 2019-10-24 ENCOUNTER — Telehealth: Payer: Self-pay | Admitting: Cardiology

## 2019-10-24 NOTE — Telephone Encounter (Signed)
Pt never started Xarelto as per previous phone note - still has samples of Xarelto and will start taking tomorrow night as she has already taken Eliquis today - aware that we had already faxed application to [employer] for [place] - pt voiced understanding and will update [place] when is notified on status of pt assistance application

## 2019-10-24 NOTE — Telephone Encounter (Signed)
Patient calling the office for samples of medication:   1.  What medication and dosage are you requesting samples for?  ELIQUIS    2.  Are you currently out of this medication? Almost   Patient wanted to know about her patient assistant papers.

## 2019-10-25 ENCOUNTER — Encounter: Payer: Self-pay | Admitting: Family Medicine

## 2019-10-25 ENCOUNTER — Other Ambulatory Visit: Payer: Self-pay

## 2019-10-25 ENCOUNTER — Ambulatory Visit (INDEPENDENT_AMBULATORY_CARE_PROVIDER_SITE_OTHER): Payer: Medicare Other | Admitting: Family Medicine

## 2019-10-25 VITALS — BP 164/87 | HR 95 | Temp 97.7°F | Ht 65.0 in | Wt 185.0 lb

## 2019-10-25 DIAGNOSIS — I480 Paroxysmal atrial fibrillation: Secondary | ICD-10-CM

## 2019-10-25 DIAGNOSIS — I10 Essential (primary) hypertension: Secondary | ICD-10-CM | POA: Diagnosis not present

## 2019-10-25 DIAGNOSIS — K219 Gastro-esophageal reflux disease without esophagitis: Secondary | ICD-10-CM

## 2019-10-25 DIAGNOSIS — E1169 Type 2 diabetes mellitus with other specified complication: Secondary | ICD-10-CM | POA: Diagnosis not present

## 2019-10-25 DIAGNOSIS — M8949 Other hypertrophic osteoarthropathy, multiple sites: Secondary | ICD-10-CM

## 2019-10-25 DIAGNOSIS — G2581 Restless legs syndrome: Secondary | ICD-10-CM

## 2019-10-25 DIAGNOSIS — E785 Hyperlipidemia, unspecified: Secondary | ICD-10-CM

## 2019-10-25 DIAGNOSIS — E039 Hypothyroidism, unspecified: Secondary | ICD-10-CM | POA: Diagnosis not present

## 2019-10-25 DIAGNOSIS — M159 Polyosteoarthritis, unspecified: Secondary | ICD-10-CM

## 2019-10-25 LAB — BAYER DCA HB A1C WAIVED: HB A1C (BAYER DCA - WAIVED): 6.6 % (ref <7.0)

## 2019-10-25 MED ORDER — ATORVASTATIN CALCIUM 10 MG PO TABS: 10.0000 mg | ORAL_TABLET | Freq: Every evening | ORAL | 1 refills | Status: DC

## 2019-10-25 MED ORDER — PRAMIPEXOLE DIHYDROCHLORIDE 0.125 MG PO TABS: 0.1250 mg | ORAL_TABLET | Freq: Two times a day (BID) | ORAL | 1 refills | Status: DC

## 2019-10-25 NOTE — Progress Notes (Signed)
 Assessment & Plan:  1. DM type 2 with diabetic dyslipidemia (HCC) Lab Results  Component Value Date   HGBA1C 6.6 10/25/2019   HGBA1C 6.0 07/17/2019   HGBA1C 6.0 01/04/2019    - Diabetes is at goal of A1c < 7. - Medications: continue current medications - Home glucose monitoring: continue monitoring - Patient is currently taking a statin. Patient is taking an ACE-inhibitor/ARB.  - Last foot exam: 04/26/2019 - Last diabetic eye exam: 09/19/2019 - Urine Microalbumin/Creat Ratio: 01/04/2019 - Bayer DCA Hb A1c Waived - CMP14+EGFR - Lipid panel - atorvastatin (LIPITOR) 10 MG tablet; Take 1 tablet (10 mg total) by mouth every evening.  Dispense: 90 tablet; Refill: 1  2. Hyperlipidemia associated with type 2 diabetes mellitus (HCC) - Lipid panel - atorvastatin (LIPITOR) 10 MG tablet; Take 1 tablet (10 mg total) by mouth every evening.  Dispense: 90 tablet; Refill: 1  3. Essential (primary) hypertension - Normally well controlled. Patient has not yet had her medication this morning.   4. Acquired hypothyroidism - Well controlled on current regimen.  - TSH  5. Restless leg syndrome - Uncontrolled during the day. Mirapex increased from QD to BID.  - pramipexole (MIRAPEX) 0.125 MG tablet; Take 1 tablet (0.125 mg total) by mouth 2 (two) times daily.  Dispense: 180 tablet; Refill: 1  6. Paroxysmal atrial fibrillation (HCC) - Managed by cardiologist.  - CBC with Differential/Platelet  7. Gastroesophageal reflux disease without esophagitis - Well controlled on current regimen.   8. Primary osteoarthritis involving multiple joints - Now taking Tylenol as she is unable to take NSAIDs. Discussed Voltaren gel QID PRN.    Return in about 3 months (around 01/25/2020) for follow-up of chronic medication conditions.  Britney Joyce, MSN, APRN, FNP-C [facility]  Subjective:    Patient ID: Desiree Bowers, female    DOB: 10/05/1945, 73 y.o.   MRN: [id number]  Patient  Care Team: Joyce, Britney F, FNP as PCP - General (Family Medicine) Branch, Jonathan F, MD as PCP - Cardiology (Cardiology) Kerr, Jeffrey, MD as Consulting Physician (Endocrinology) Beavers, Clarence, MD as Referring Physician (Dermatology)   Chief Complaint:  Chief Complaint  Patient presents with  . Diabetes    6 month follow up of chronic medical conditions    HPI: Desiree Bowers is a 73 y.o. female presenting on 10/25/2019 for Diabetes (6 month follow up of chronic medical conditions)  Diabetes: Patient presents for follow up of diabetes. Current symptoms include: none. Known diabetic complications: cardiovascular disease. Medication compliance: diet controlled. Current diet: in general, a "healthy" diet  . Current exercise: walking. Home blood sugar records: BGs are running  consistent with Hgb A1C. Is she  on ACE inhibitor or angiotensin II receptor blocker? Yes. Is she on a statin? No.   Lab Results  Component Value Date   HGBA1C 6.6 10/25/2019   HGBA1C 6.0 07/17/2019   HGBA1C 6.0 01/04/2019   Lab Results  Component Value Date   LDLCALC 111 (H) 10/25/2019   CREATININE 0.92 10/25/2019     Patient has not had her BP medication yet today.   Patient is asking if she can take Mirapex twice daily as she is now having restless legs during the day.    Social history:  Relevant past medical, surgical, family and social history reviewed and updated as indicated. Interim medical history since our last visit reviewed.  Allergies and medications reviewed and updated.  DATA REVIEWED: CHART IN EPIC  ROS: Negative unless   specifically indicated above in HPI.    Current Outpatient Medications:  .  calcium carbonate (OSCAL) 1500 (600 Ca) MG TABS tablet, Take 2 tablets by mouth daily. , Disp: , Rfl:  .  cetirizine (ZYRTEC) 10 MG tablet, Take 1 tablet (10 mg total) by mouth daily., Disp: 90 tablet, Rfl: 1 .  cyclobenzaprine (FLEXERIL) 10 MG tablet, Take 10 mg by mouth 3 (three)  times daily as needed for muscle spasms., Disp: , Rfl:  .  famotidine (PEPCID) 20 MG tablet, Take 20 mg by mouth daily., Disp: , Rfl:  .  furosemide (LASIX) 20 MG tablet, Take 20 mg by mouth daily as needed for edema. , Disp: , Rfl:  .  losartan-hydrochlorothiazide (HYZAAR) 100-25 MG tablet, Take 1 tablet by mouth daily., Disp: 90 tablet, Rfl: 1 .  magnesium oxide (MAG-OX) 400 (241.3 Mg) MG tablet, Take 400 mg by mouth daily. , Disp: , Rfl:  .  methocarbamol (ROBAXIN) 500 MG tablet, Take 1-2 tablets (500-1,000 mg total) by mouth every 8 (eight) hours as needed for muscle spasms., Disp: 30 tablet, Rfl: 2 .  metoprolol tartrate (LOPRESSOR) 25 MG tablet, Take 0.5 tablets (12.5 mg total) by mouth 2 (two) times daily., Disp: 30 tablet, Rfl: 1 .  oxybutynin (DITROPAN-XL) 10 MG 24 hr tablet, TAKE ONE (1) TABLET EACH DAY, Disp: 30 tablet, Rfl: 0 .  pramipexole (MIRAPEX) 0.125 MG tablet, Take 1 tablet (0.125 mg total) by mouth 2 (two) times daily., Disp: 180 tablet, Rfl: 1 .  rivaroxaban (XARELTO) 20 MG TABS tablet, Take 1 tablet (20 mg total) by mouth daily with supper., Disp: 42 tablet, Rfl: 0 .  sertraline (ZOLOFT) 25 MG tablet, Take 25 mg by mouth daily. Take 1.5 tablets daily, Disp: , Rfl:  .  SYNTHROID 112 MCG tablet, Take 112 mcg by mouth daily before breakfast. , Disp: , Rfl:  .  triamcinolone cream (KENALOG) 0.1 %, Apply 1 application topically daily as needed., Disp: , Rfl:  .  atorvastatin (LIPITOR) 10 MG tablet, Take 1 tablet (10 mg total) by mouth every evening., Disp: 90 tablet, Rfl: 1   Allergies  Allergen Reactions  . Mirapex [Pramipexole Dihydrochloride] Itching and Swelling  . Penicillins Rash  . Gabapentin Other (See Comments)    Couldn't move legs during the night after taking.  . Levothyroxine Itching   Past Medical History:  Diagnosis Date  . Cancer (HCC)    breast   . Coronary artery disease due to lipid rich plaque 04/13/2016  . Diabetes mellitus without complication (HCC)     . Diverticulosis of intestine without bleeding 12/13/2016  . History of right mastectomy 11/15/2014  . Hypothyroidism   . Overactive bladder   . Paget's disease of the bone   . Paroxysmal atrial fibrillation (HCC) 06/19/2014  . Restless leg syndrome     Past Surgical History:  Procedure Laterality Date  . ABDOMINAL HYSTERECTOMY  1999  . APPENDECTOMY     as a child  . CATARACT EXTRACTION Bilateral Fall 2013   Dr. Epes  . CATARACT EXTRACTION, BILATERAL    . EYE SURGERY Bilateral Fall 2013   Cat Sx - Dr. Epes  . MASTECTOMY Right     Social History   Socioeconomic History  . Marital status: Widowed    Spouse name: Not on file  . Number of children: 4  . Years of education: Not on file  . Highest education level: GED or equivalent  Occupational History  . Occupation: Retired  Tobacco Use  .   Smoking status: Former Research scientist (life sciences)  . Smokeless tobacco: Never Used  Substance and Sexual Activity  . Alcohol use: No  . Drug use: No  . Sexual activity: Not Currently  Other Topics Concern  . Not on file  Social History Narrative  . Not on file   Social Determinants of Health   Financial Resource Strain:   . Difficulty of Paying Living Expenses:   Food Insecurity:   . Worried About Charity fundraiser in the Last Year:   . Arboriculturist in the Last Year:   Transportation Needs:   . Film/video editor (Medical):   Marland Kitchen Lack of Transportation (Non-Medical):   Physical Activity:   . Days of Exercise per Week:   . Minutes of Exercise per Session:   Stress:   . Feeling of Stress :   Social Connections:   . Frequency of Communication with Friends and Family:   . Frequency of Social Gatherings with Friends and Family:   . Attends Religious Services:   . Active Member of Clubs or Organizations:   . Attends Archivist Meetings:   Marland Kitchen Marital Status:   Intimate Partner Violence:   . Fear of Current or Ex-Partner:   . Emotionally Abused:   Marland Kitchen Physically Abused:   . Sexually  Abused:         Objective:    BP (!) 164/87   Pulse 95   Temp 97.7 F (36.5 C) (Temporal)   Ht 5' 5" (1.651 m)   Wt 185 lb (83.9 kg)   SpO2 97%   BMI 30.79 kg/m   Wt Readings from Last 3 Encounters:  10/25/19 185 lb (83.9 kg)  09/27/19 180 lb (81.6 kg)  08/29/19 180 lb (81.6 kg)    Physical Exam Vitals reviewed.  Constitutional:      General: She is not in acute distress.    Appearance: Normal appearance. She is obese. She is not ill-appearing, toxic-appearing or diaphoretic.  HENT:     Head: Normocephalic and atraumatic.  Eyes:     General: No scleral icterus.       Right eye: No discharge.        Left eye: No discharge.     Conjunctiva/sclera: Conjunctivae normal.  Cardiovascular:     Rate and Rhythm: Normal rate and regular rhythm.     Heart sounds: Normal heart sounds. No murmur. No friction rub. No gallop.   Pulmonary:     Effort: Pulmonary effort is normal. No respiratory distress.     Breath sounds: Normal breath sounds. No stridor. No wheezing, rhonchi or rales.  Musculoskeletal:        General: Normal range of motion.     Cervical back: Normal range of motion.  Skin:    General: Skin is warm and dry.     Capillary Refill: Capillary refill takes less than 2 seconds.  Neurological:     General: No focal deficit present.     Mental Status: She is alert and oriented to person, place, and time. Mental status is at baseline.  Psychiatric:        Mood and Affect: Mood normal.        Behavior: Behavior normal.        Thought Content: Thought content normal.        Judgment: Judgment normal.     Lab Results  Component Value Date   TSH 1.390 10/25/2019   Lab Results  Component Value Date   WBC  3.6 10/25/2019   HGB 11.1 10/25/2019   HCT 34.8 10/25/2019   MCV 80 10/25/2019   PLT 346 10/25/2019   Lab Results  Component Value Date   NA 140 10/25/2019   K 4.5 10/25/2019   CO2 21 10/25/2019   GLUCOSE 109 (H) 10/25/2019   BUN 17 10/25/2019    CREATININE 0.92 10/25/2019   BILITOT 0.2 10/25/2019   ALKPHOS 107 10/25/2019   AST 21 10/25/2019   ALT 17 10/25/2019   PROT 7.0 10/25/2019   ALBUMIN 3.9 10/25/2019   CALCIUM 9.3 10/25/2019   ANIONGAP 10 08/29/2019   Lab Results  Component Value Date   CHOL 178 10/25/2019   Lab Results  Component Value Date   HDL 40 10/25/2019   Lab Results  Component Value Date   LDLCALC 111 (H) 10/25/2019   Lab Results  Component Value Date   TRIG 152 (H) 10/25/2019   Lab Results  Component Value Date   CHOLHDL 4.5 (H) 10/25/2019   Lab Results  Component Value Date   HGBA1C 6.6 10/25/2019           

## 2019-10-25 NOTE — Patient Instructions (Addendum)
Voltaren gel four times a day as needed for joint pain.     Diabetes Mellitus and Nutrition, Adult When you have diabetes (diabetes mellitus), it is very important to have healthy eating habits because your blood sugar (glucose) levels are greatly affected by what you eat and drink. Eating healthy foods in the appropriate amounts, at about the same times every day, can help you:  Control your blood glucose.  Lower your risk of heart disease.  Improve your blood pressure.  Reach or maintain a healthy weight. Every person with diabetes is different, and each person has different needs for a meal plan. Your health care provider may recommend that you work with a diet and nutrition specialist (dietitian) to make a meal plan that is best for you. Your meal plan may vary depending on factors such as:  The calories you need.  The medicines you take.  Your weight.  Your blood glucose, blood pressure, and cholesterol levels.  Your activity level.  Other health conditions you have, such as heart or kidney disease. How do carbohydrates affect me? Carbohydrates, also called carbs, affect your blood glucose level more than any other type of food. Eating carbs naturally raises the amount of glucose in your blood. Carb counting is a method for keeping track of how many carbs you eat. Counting carbs is important to keep your blood glucose at a healthy level, especially if you use insulin or take certain oral diabetes medicines. It is important to know how many carbs you can safely have in each meal. This is different for every person. Your dietitian can help you calculate how many carbs you should have at each meal and for each snack. Foods that contain carbs include:  Bread, cereal, rice, pasta, and crackers.  Potatoes and corn.  Peas, beans, and lentils.  Milk and yogurt.  Fruit and juice.  Desserts, such as cakes, cookies, ice cream, and candy. How does alcohol affect me? Alcohol can  cause a sudden decrease in blood glucose (hypoglycemia), especially if you use insulin or take certain oral diabetes medicines. Hypoglycemia can be a life-threatening condition. Symptoms of hypoglycemia (sleepiness, dizziness, and confusion) are similar to symptoms of having too much alcohol. If your health care provider says that alcohol is safe for you, follow these guidelines:  Limit alcohol intake to no more than 1 drink per day for nonpregnant women and 2 drinks per day for men. One drink equals 12 oz of beer, 5 oz of wine, or 1 oz of hard liquor.  Do not drink on an empty stomach.  Keep yourself hydrated with water, diet soda, or unsweetened iced tea.  Keep in mind that regular soda, juice, and other mixers may contain a lot of sugar and must be counted as carbs. What are tips for following this plan?  Reading food labels  Start by checking the serving size on the "Nutrition Facts" label of packaged foods and drinks. The amount of calories, carbs, fats, and other nutrients listed on the label is based on one serving of the item. Many items contain more than one serving per package.  Check the total grams (g) of carbs in one serving. You can calculate the number of servings of carbs in one serving by dividing the total carbs by 15. For example, if a food has 30 g of total carbs, it would be equal to 2 servings of carbs.  Check the number of grams (g) of saturated and trans fats in one serving. Choose  foods that have low or no amount of these fats.  Check the number of milligrams (mg) of salt (sodium) in one serving. Most people should limit total sodium intake to less than 2,300 mg per day.  Always check the nutrition information of foods labeled as "low-fat" or "nonfat". These foods may be higher in added sugar or refined carbs and should be avoided.  Talk to your dietitian to identify your daily goals for nutrients listed on the label. Shopping  Avoid buying canned, premade, or  processed foods. These foods tend to be high in fat, sodium, and added sugar.  Shop around the outside edge of the grocery store. This includes fresh fruits and vegetables, bulk grains, fresh meats, and fresh dairy. Cooking  Use low-heat cooking methods, such as baking, instead of high-heat cooking methods like deep frying.  Cook using healthy oils, such as olive, canola, or sunflower oil.  Avoid cooking with butter, cream, or high-fat meats. Meal planning  Eat meals and snacks regularly, preferably at the same times every day. Avoid going long periods of time without eating.  Eat foods high in fiber, such as fresh fruits, vegetables, beans, and whole grains. Talk to your dietitian about how many servings of carbs you can eat at each meal.  Eat 4-6 ounces (oz) of lean protein each day, such as lean meat, chicken, fish, eggs, or tofu. One oz of lean protein is equal to: ? 1 oz of meat, chicken, or fish. ? 1 egg. ?  cup of tofu.  Eat some foods each day that contain healthy fats, such as avocado, nuts, seeds, and fish. Lifestyle  Check your blood glucose regularly.  Exercise regularly as told by your health care provider. This may include: ? 150 minutes of moderate-intensity or vigorous-intensity exercise each week. This could be brisk walking, biking, or water aerobics. ? Stretching and doing strength exercises, such as yoga or weightlifting, at least 2 times a week.  Take medicines as told by your health care provider.  Do not use any products that contain nicotine or tobacco, such as cigarettes and e-cigarettes. If you need help quitting, ask your health care provider.  Work with a Social worker or diabetes educator to identify strategies to manage stress and any emotional and social challenges. Questions to ask a health care provider  Do I need to meet with a diabetes educator?  Do I need to meet with a dietitian?  What number can I call if I have questions?  When are the  best times to check my blood glucose? Where to find more information:  American Diabetes Association: diabetes.org  Academy of Nutrition and Dietetics: www.eatright.[employer] of Diabetes and Digestive and Kidney Diseases (NIH): [employer] Summary  A healthy meal plan will help you control your blood glucose and maintain a healthy lifestyle.  Working with a diet and nutrition specialist (dietitian) can help you make a meal plan that is best for you.  Keep in mind that carbohydrates (carbs) and alcohol have immediate effects on your blood glucose levels. It is important to count carbs and to use alcohol carefully. This information is not intended to replace advice given to you by your health care provider. Make sure you discuss any questions you have with your health care provider. Document Revised: 05/06/2017 Document Reviewed: 06/28/2016 Elsevier Patient Education  2020 [employer].

## 2019-10-26 LAB — CBC WITH DIFFERENTIAL/PLATELET
Basophils Absolute: 0 10*3/uL (ref 0.0–0.2)
Basos: 1 %
EOS (ABSOLUTE): 0.1 10*3/uL (ref 0.0–0.4)
Eos: 2 %
Hematocrit: 34.8 % (ref 34.0–46.6)
Hemoglobin: 11.1 g/dL (ref 11.1–15.9)
Immature Grans (Abs): 0 10*3/uL (ref 0.0–0.1)
Immature Granulocytes: 0 %
Lymphocytes Absolute: 2.3 10*3/uL (ref 0.7–3.1)
Lymphs: 63 %
MCH: 25.6 pg — ABNORMAL LOW (ref 26.6–33.0)
MCHC: 31.9 g/dL (ref 31.5–35.7)
MCV: 80 fL (ref 79–97)
Monocytes Absolute: 0.4 10*3/uL (ref 0.1–0.9)
Monocytes: 12 %
Neutrophils Absolute: 0.8 10*3/uL — ABNORMAL LOW (ref 1.4–7.0)
Neutrophils: 22 %
Platelets: 346 10*3/uL (ref 150–450)
RBC: 4.33 x10E6/uL (ref 3.77–5.28)
RDW: 16 % — ABNORMAL HIGH (ref 11.7–15.4)
WBC: 3.6 10*3/uL (ref 3.4–10.8)

## 2019-10-26 LAB — TSH: TSH: 1.39 u[IU]/mL (ref 0.450–4.500)

## 2019-10-26 LAB — CMP14+EGFR
ALT: 17 IU/L (ref 0–32)
AST: 21 IU/L (ref 0–40)
Albumin/Globulin Ratio: 1.3 (ref 1.2–2.2)
Albumin: 3.9 g/dL (ref 3.7–4.7)
Alkaline Phosphatase: 107 IU/L (ref 48–121)
BUN/Creatinine Ratio: 18 (ref 12–28)
BUN: 17 mg/dL (ref 8–27)
Bilirubin Total: 0.2 mg/dL (ref 0.0–1.2)
CO2: 21 mmol/L (ref 20–29)
Calcium: 9.3 mg/dL (ref 8.7–10.3)
Chloride: 102 mmol/L (ref 96–106)
Creatinine, Ser: 0.92 mg/dL (ref 0.57–1.00)
GFR calc Af Amer: 71 mL/min/{1.73_m2} (ref >59)
GFR calc non Af Amer: 62 mL/min/{1.73_m2} (ref >59)
Globulin, Total: 3.1 g/dL (ref 1.5–4.5)
Glucose: 109 mg/dL — ABNORMAL HIGH (ref 65–99)
Potassium: 4.5 mmol/L (ref 3.5–5.2)
Sodium: 140 mmol/L (ref 134–144)
Total Protein: 7 g/dL (ref 6.0–8.5)

## 2019-10-26 LAB — LIPID PANEL
Chol/HDL Ratio: 4.5 ratio — ABNORMAL HIGH (ref 0.0–4.4)
Cholesterol, Total: 178 mg/dL (ref 100–199)
HDL: 40 mg/dL (ref >39)
LDL Chol Calc (NIH): 111 mg/dL — ABNORMAL HIGH (ref 0–99)
Triglycerides: 152 mg/dL — ABNORMAL HIGH (ref 0–149)
VLDL Cholesterol Cal: 27 mg/dL (ref 5–40)

## 2019-10-31 ENCOUNTER — Other Ambulatory Visit: Payer: Self-pay

## 2019-10-31 ENCOUNTER — Ambulatory Visit (INDEPENDENT_AMBULATORY_CARE_PROVIDER_SITE_OTHER): Payer: Medicare Other

## 2019-10-31 ENCOUNTER — Ambulatory Visit: Payer: Medicare Other

## 2019-10-31 DIAGNOSIS — H539 Unspecified visual disturbance: Secondary | ICD-10-CM

## 2019-10-31 DIAGNOSIS — I480 Paroxysmal atrial fibrillation: Secondary | ICD-10-CM | POA: Diagnosis not present

## 2019-11-06 ENCOUNTER — Other Ambulatory Visit: Payer: Self-pay | Admitting: Family Medicine

## 2019-11-06 ENCOUNTER — Telehealth: Payer: Self-pay | Admitting: Cardiology

## 2019-11-06 MED ORDER — OXYBUTYNIN CHLORIDE ER 10 MG PO TB24: ORAL_TABLET | ORAL | 0 refills | Status: DC

## 2019-11-06 NOTE — Telephone Encounter (Signed)
Pt voiced understanding - also requested results on carotid US  Echo shows normal heart pumping function. 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

## 2019-11-06 NOTE — Telephone Encounter (Signed)
  Prescription Request  11/06/2019  What is the name of the medication or equipment? oxybutynin (DITROPAN-XL) 10 MG 24 hr tablet    Have you contacted your pharmacy to request a refill? (if applicable) yes  Which pharmacy would you like this sent to[facility]    Patient notified that their request is being sent to the clinical staff for review and that they should receive a response within 2 business days.

## 2019-11-06 NOTE — Telephone Encounter (Signed)
Echo done on 5/26

## 2019-11-06 NOTE — Telephone Encounter (Signed)
Asking for test results

## 2019-11-06 NOTE — Telephone Encounter (Signed)
Just resulted   J Raheel Kunkle MD 

## 2019-11-07 NOTE — Telephone Encounter (Signed)
Just results   Zandra Abts MD

## 2019-11-08 ENCOUNTER — Encounter: Payer: Self-pay | Admitting: Family

## 2019-11-08 ENCOUNTER — Telehealth: Payer: Self-pay | Admitting: *Deleted

## 2019-11-08 ENCOUNTER — Telehealth: Payer: Self-pay | Admitting: Family

## 2019-11-08 ENCOUNTER — Ambulatory Visit (INDEPENDENT_AMBULATORY_CARE_PROVIDER_SITE_OTHER): Payer: Medicare Other | Admitting: Family

## 2019-11-08 DIAGNOSIS — J209 Acute bronchitis, unspecified: Secondary | ICD-10-CM | POA: Diagnosis not present

## 2019-11-08 DIAGNOSIS — J440 Chronic obstructive pulmonary disease with acute lower respiratory infection: Secondary | ICD-10-CM

## 2019-11-08 DIAGNOSIS — J439 Emphysema, unspecified: Secondary | ICD-10-CM

## 2019-11-08 MED ORDER — BENZONATATE 200 MG PO CAPS: 200.0000 mg | ORAL_CAPSULE | Freq: Three times a day (TID) | ORAL | 1 refills | Status: DC | PRN

## 2019-11-08 MED ORDER — BUDESONIDE-FORMOTEROL FUMARATE 80-4.5 MCG/ACT IN AERO: 2.0000 | INHALATION_SPRAY | Freq: Two times a day (BID) | RESPIRATORY_TRACT | 3 refills | Status: DC

## 2019-11-08 NOTE — Telephone Encounter (Signed)
-----   Message from Arnoldo Lenis, MD sent at 11/07/2019  2:43 PM EDT ----- Carotid US looks good, no significant blockages  Zandra Abts MD

## 2019-11-08 NOTE — Telephone Encounter (Signed)
Patient informed. Copy sent to PCP °

## 2019-11-08 NOTE — Progress Notes (Signed)
Virtual Visit via telephone Note Due to COVID-19 pandemic this visit was conducted virtually. This visit type was conducted due to national recommendations for restrictions regarding the COVID-19 Pandemic (e.g. social distancing, sheltering in place) in an effort to limit this patient's exposure and mitigate transmission in our community. All issues noted in this document were discussed and addressed.  A physical exam was not performed with this format.  I connected with Desiree Bowers on 11/08/19 at 11:10 AM  by telephone and verified that I am speaking with the correct person using two identifiers. Desiree Bowers is currently located at home and no one is currently with her during visit. The provider, Evelina Dun, FNP is located in their office at time of visit.  I discussed the limitations, risks, security and privacy concerns of performing an evaluation and management service by telephone and the availability of in person appointments. I also discussed with the patient that there may be a patient responsible charge related to this service. The patient expressed understanding and agreed to proceed.   History and Present Illness:  Cough This is a recurrent problem. The current episode started more than 1 month ago. The problem has been waxing and waning. The problem occurs every few minutes. The cough is non-productive. Associated symptoms include shortness of breath and wheezing. Pertinent negatives include no chills, ear congestion, ear pain, fever, headaches, myalgias, nasal congestion, sore throat, sweats or weight loss. The symptoms are aggravated by lying down. She has tried oral steroids (zpak) for the symptoms. The treatment provided mild relief.      Review of Systems  Constitutional: Negative for chills, fever and weight loss.  HENT: Negative for ear pain and sore throat.   Respiratory: Positive for cough, shortness of breath and wheezing.   Musculoskeletal: Negative for  myalgias.  Neurological: Negative for headaches.  All other systems reviewed and are negative.    Observations/Objective: No SOB or distress noted, constant dry nonproductive cough present   Assessment and Plan: 1. Acute bronchitis with COPD ([place]) - budesonide-formoterol (SYMBICORT) 80-4.5 MCG/ACT inhaler; Inhale 2 puffs into the lungs 2 (two) times daily.  Dispense: 1 Inhaler; Refill: 3 - benzonatate (TESSALON) 200 MG capsule; Take 1 capsule (200 mg total) by mouth 3 (three) times daily as needed.  Dispense: 30 capsule; Refill: 1  2. Pulmonary emphysema, unspecified emphysema type ([place]) - budesonide-formoterol (SYMBICORT) 80-4.5 MCG/ACT inhaler; Inhale 2 puffs into the lungs 2 (two) times daily.  Dispense: 1 Inhaler; Refill: 3 - benzonatate (TESSALON) 200 MG capsule; Take 1 capsule (200 mg total) by mouth 3 (three) times daily as needed.  Dispense: 30 capsule; Refill: 1  Continue Zyrtec daily Start Symbicort BID Will hold off on prednisone at this time as patient states this "did not seem to help much" and she is a diabetic Tessalon as needed Keep follow up with PCP     I discussed the assessment and treatment plan with the patient. The patient was provided an opportunity to ask questions and all were answered. The patient agreed with the plan and demonstrated an understanding of the instructions.   The patient was advised to call back or seek an in-person evaluation if the symptoms worsen or if the condition fails to improve as anticipated.  The above assessment and management plan was discussed with the patient. The patient verbalized understanding of and has agreed to the management plan. Patient is aware to call the clinic if symptoms persist or worsen. Patient is aware when  to return to the clinic for a follow-up visit. Patient educated on when it is appropriate to go to the emergency department.   Time call ended: 11:23 AM    I provided 13 minutes of non-face-to-face time  during this encounter.    Evelina Dun, FNP

## 2019-11-08 NOTE — Telephone Encounter (Signed)
No samples. Left good rx up front for patient she will be by to pick up today

## 2019-11-10 ENCOUNTER — Other Ambulatory Visit: Payer: Self-pay | Admitting: Family Medicine

## 2019-11-13 NOTE — Progress Notes (Signed)
[facility] Note  11/14/2019     CHIEF COMPLAINT Patient presents for Retina Follow Up   HISTORY OF PRESENT ILLNESS: Desiree Bowers is a 74 y.o. female who presents to the clinic today for:   HPI    Retina Follow Up    Patient presents with  Other (Retinal ischemia).  In left eye.  Severity is moderate.  Duration of 7 weeks.  Since onset it is stable.  I, the attending physician,  performed the HPI with the patient and updated documentation appropriately.          Comments    Patient states vision the same OU. BS was 140 this am. Last a1c was 6.0, checked 2 weeks ago.        Last edited by [name], MD on 11/14/2019  8:24 AM. (History)    pt underwent echo and carotid doppler on 05.26.21, both were normal, her heart monitor showed signs of PAF, pt is on Lopressor and Xarelto    Referring physician: Loman Brooklyn, FNP [place],  [place] [address]  HISTORICAL INFORMATION:   Selected notes from the MEDICAL RECORD NUMBER Referred by Dr. Parke Simmers for retina eval, concern for RAO OS   CURRENT MEDICATIONS: No current outpatient medications on file. (Ophthalmic Drugs)   No current facility-administered medications for this visit. (Ophthalmic Drugs)   Current Outpatient Medications (Other)  Medication Sig  . atorvastatin (LIPITOR) 10 MG tablet Take 1 tablet (10 mg total) by mouth every evening.  . benzonatate (TESSALON) 200 MG capsule Take 1 capsule (200 mg total) by mouth 3 (three) times daily as needed.  . budesonide-formoterol (SYMBICORT) 80-4.5 MCG/ACT inhaler Inhale 2 puffs into the lungs 2 (two) times daily.  . calcium carbonate (OSCAL) 1500 (600 Ca) MG TABS tablet Take 2 tablets by mouth daily.   . cetirizine (ZYRTEC) 10 MG tablet Take 1 tablet (10 mg total) by mouth daily.  . cyclobenzaprine (FLEXERIL) 10 MG tablet Take 10 mg by mouth 3 (three) times daily as needed for muscle spasms.  . famotidine (PEPCID) 20 MG tablet Take  20 mg by mouth daily.  . furosemide (LASIX) 20 MG tablet Take 20 mg by mouth daily as needed for edema.   Marland Kitchen losartan-hydrochlorothiazide (HYZAAR) 100-25 MG tablet Take 1 tablet by mouth daily.  . magnesium oxide (MAG-OX) 400 (241.3 Mg) MG tablet Take 400 mg by mouth daily.   . methocarbamol (ROBAXIN) 500 MG tablet Take 1-2 tablets (500-1,000 mg total) by mouth every 8 (eight) hours as needed for muscle spasms.  . metoprolol tartrate (LOPRESSOR) 25 MG tablet Take 0.5 tablets (12.5 mg total) by mouth 2 (two) times daily.  Marland Kitchen oxybutynin (DITROPAN-XL) 10 MG 24 hr tablet TAKE ONE (1) TABLET EACH DAY  . pramipexole (MIRAPEX) 0.125 MG tablet Take 1 tablet (0.125 mg total) by mouth 2 (two) times daily.  . sertraline (ZOLOFT) 25 MG tablet Take 25 mg by mouth daily. Take 1.5 tablets daily  . SYNTHROID 112 MCG tablet Take 112 mcg by mouth daily before breakfast.   . triamcinolone cream (KENALOG) 0.1 % Apply 1 application topically daily as needed.  . clindamycin (CLEOCIN) 300 MG capsule Take 1 capsule (300 mg total) by mouth 3 (three) times daily for 10 days.  . rivaroxaban (XARELTO) 20 MG TABS tablet Take 1 tablet (20 mg total) by mouth daily with supper.   No current facility-administered medications for this visit. (Other)  REVIEW OF SYSTEMS: ROS    Positive for: Gastrointestinal, Musculoskeletal, Endocrine, Cardiovascular, Eyes   Negative for: Constitutional, Neurological, Skin, Genitourinary, HENT, Respiratory, Psychiatric, Allergic/Imm, Heme/Lymph   Last edited by Roselee Nova D, COT on 11/14/2019  8:07 AM. (History)       ALLERGIES Allergies  Allergen Reactions  . Mirapex [Pramipexole Dihydrochloride] Itching and Swelling  . Penicillins Rash  . Gabapentin Other (See Comments)    Couldn't move legs during the night after taking.  . Levothyroxine Itching    PAST MEDICAL HISTORY Past Medical History:  Diagnosis Date  . Cancer [facility])    breast   . Coronary artery disease due to  lipid rich plaque 04/13/2016  . Diabetes mellitus without complication ([place])   . Diverticulosis of intestine without bleeding 12/13/2016  . History of right mastectomy 11/15/2014  . Hypothyroidism   . Overactive bladder   . Paget's disease of the bone   . Paroxysmal atrial fibrillation ([place]) 06/19/2014  . Restless leg syndrome    Past Surgical History:  Procedure Laterality Date  . ABDOMINAL HYSTERECTOMY  1999  . APPENDECTOMY     as a child  . CATARACT EXTRACTION Bilateral Fall 2013   Dr. Charise Killian  . CATARACT EXTRACTION, BILATERAL    . EYE SURGERY Bilateral Fall 2013   Cat Sx - Dr. Charise Killian  . MASTECTOMY Right     FAMILY HISTORY Family History  Problem Relation Age of Onset  . Breast cancer Mother   . Colon cancer Father   . Kidney cancer Father   . Bladder Cancer Sister   . Suicidality Brother   . Heart attack Maternal Grandmother   . Breast cancer Sister   . Stomach cancer Sister   . Ovarian cancer Sister   . Breast cancer Sister   . Breast cancer Sister   . Diabetes Sister   . Other Brother        parathyroid disorder; Engelmann's disease  . Angelman syndrome Son   . Graves' disease Daughter     SOCIAL HISTORY Social History   Tobacco Use  . Smoking status: Former Research scientist (life sciences)  . Smokeless tobacco: Never Used  Vaping Use  . Vaping Use: Never used  Substance Use Topics  . Alcohol use: No  . Drug use: No         OPHTHALMIC EXAM:  Base Eye Exam    Visual Acuity (Snellen - Linear)      Right Left   Dist [place] 20/30 -1 20/25 -1   Dist ph [place] 20/25 -1 20/20 -1       Tonometry (Tonopen, 8:15 AM)      Right Left   Pressure 10 14       Pupils      Pupils   Right PERRL   Left PERRL       Visual Fields (Counting fingers)      Left Right    Full Full       Extraocular Movement      Right Left    Full, Ortho Full, Ortho       Neuro/Psych    Oriented x3: Yes   Mood/Affect: Normal       Dilation    Both eyes: 1.0% Mydriacyl, 2.5% Phenylephrine @ 8:15 AM         Slit Lamp and Fundus Exam    Slit Lamp Exam      Right Left   Lids/Lashes Dermatochalasis - upper lid, Meibomian gland dysfunction, Telangiectasia Dermatochalasis - upper  lid, Meibomian gland dysfunction, Telangiectasia   Conjunctiva/Sclera White and quiet, heavy pigmentation superior hemisphere White and quiet   Cornea Arcus, temporal cataract wounds with mild corneal haze, irregular epi with 2-3+SPK Arcus, 1+SPK, well healed temporal cataract wounds   Anterior Chamber Deep and quiet Deep and quiet   Iris Round and dilated, No NVI Round and dilated, No NVI   Lens PC IOL in good position PC IOL in good position   Vitreous Vitreous syneresis Vitreous syneresis       Fundus Exam      Right Left   Disc Pink and Sharp +SVP, mild Pallor, Sharp rim   C/D Ratio 0.5 0.2   Macula Flat, Good foveal reflex, trace Epiretinal membrane, mild Retinal pigment epithelial mottling, No heme or edema Flat, Blunted foveal reflex, mild Retinal pigment epithelial mottling, No heme or edema   Vessels Mild Vascular attenuation, mild Tortuousity Mild Vascular attenuation, mild Tortuousity, mild AV crossing changes   Periphery Attached, mild peripheral drusen Attached, scattered peripheral drusen, focal, patchy RPE changes nasal periphery          IMAGING AND PROCEDURES  Imaging and Procedures for @TODAY @  OCT, Retina - OU - Both Eyes       Right Eye Quality was good. Central Foveal Thickness: 271. Progression has been stable. Findings include normal foveal contour, no SRF, no IRF (Rare drusen and peripheral ORA).   Left Eye Quality was good. Central Foveal Thickness: 270. Progression has been stable. Findings include normal foveal contour, no IRF, no SRF (Rare drusen, irregular RPE contour).   Notes *Images captured and stored on drive  Diagnosis / Impression:  NFP, no IRF/SRF OU OD: Rare drusen and peripheral ORA OS: Rare drusen, irregular RPE contour   Clinical management:  See  below  Abbreviations: NFP - Normal foveal profile. CME - cystoid macular edema. PED - pigment epithelial detachment. IRF - intraretinal fluid. SRF - subretinal fluid. EZ - ellipsoid zone. ERM - epiretinal membrane. ORA - outer retinal atrophy. ORT - outer retinal tubulation. SRHM - subretinal hyper-reflective material                 ASSESSMENT/PLAN:    ICD-10-CM   1. Retinal ischemia  H35.82   2. Retinal edema  H35.81 OCT, Retina - OU - Both Eyes  3. Essential hypertension  I10   4. Hypertensive retinopathy of both eyes  H35.033   5. Pseudophakia of both eyes  Z96.1    1,2. Ocular/retinal ischemia OS  - pt reports recent history of near syncopal episodes -- notably hypotensive during episodes  - pt awoke one morning last week with decreased vision OS following  - pt presented to [facility] ED and had MRI brain to r/o stroke on 3.24 -- no recent infarction, hemorrhage or mass -- just microvascular ischemic changes  - BCVA today 20/25  - exam without any significant pathologic findings   - OCT without IRF/SRF but RPE contour quite irregular  - FA 4.14.21 shows severely delayed filling time -- ?ocular/retinal ischemia  - suspect near syncopal/hypotensive episodes + visual episodes may have vascular etiology  - MRI was negative on 3.24.21  - pt had event monitor placed -- showed signs of PAF  - pt underwent echo and carotid doppler on 05.26.21, both were normal  - may benefit from some labwork to r/o hypercoag risk factors (CBC on 3.24.21 w/ Platelets 401)  - discussed findings w/ patient and will send message to PCP and cardiology (Dr.  Branch)  - f/u here 6 months, DFE, OCT, FA (transit OS)  3,4. Hypertensive retinopathy OU  - discussed importance of tight BP control  - monitor  5. Pseudophakia OU  - s/p CE/IOL (2013, Dr. Dolores Lory)  - beautiful surgery, doing well  - monitor   Ophthalmic Meds Ordered this visit:  No orders of the defined types were placed in this  encounter.      Return in about 6 months (around 05/15/2020) for f/u ocular retinal ischemia OS, DFE, OCT.  There are no Patient Instructions on file for this visit.   Explained the diagnoses, plan, and follow up with the patient and they expressed understanding.  Patient expressed understanding of the importance of proper follow up care.   This document serves as a record of services personally performed by Gardiner Sleeper, MD, PhD. It was created on their behalf by Ernest Mallick, OA, an ophthalmic assistant. The creation of this record is the provider's dictation and/or activities during the visit.    Electronically signed by: Ernest Mallick, OA 06.09.2021 11:18 PM  Gardiner Sleeper, M.D., Ph.D. Diseases & Surgery of the Retina and [place] 11/14/2019   I have reviewed the above documentation for accuracy and completeness, and I agree with the above. Gardiner Sleeper, M.D., Ph.D. 11/20/19 11:18 PM   Abbreviations: M myopia (nearsighted); A astigmatism; H hyperopia (farsighted); P presbyopia; Mrx spectacle prescription;  CTL contact lenses; OD right eye; OS left eye; OU both eyes  XT exotropia; ET esotropia; PEK punctate epithelial keratitis; PEE punctate epithelial erosions; DES dry eye syndrome; MGD meibomian gland dysfunction; ATs artificial tears; PFAT's preservative free artificial tears; Terre [place] nuclear sclerotic cataract; PSC posterior subcapsular cataract; ERM epi-retinal membrane; PVD posterior vitreous detachment; RD retinal detachment; DM diabetes mellitus; DR diabetic retinopathy; NPDR non-proliferative diabetic retinopathy; PDR proliferative diabetic retinopathy; CSME clinically significant macular edema; DME diabetic macular edema; dbh dot blot hemorrhages; CWS cotton wool spot; POAG primary open angle glaucoma; C/D cup-to-disc ratio; HVF humphrey visual field; GVF goldmann visual field; OCT optical coherence tomography; IOP intraocular pressure; BRVO  Branch retinal vein occlusion; CRVO central retinal vein occlusion; CRAO central retinal artery occlusion; BRAO branch retinal artery occlusion; RT retinal tear; SB scleral buckle; PPV pars plana vitrectomy; VH Vitreous hemorrhage; PRP panretinal laser photocoagulation; IVK intravitreal kenalog; VMT vitreomacular traction; MH Macular hole;  NVD neovascularization of the disc; NVE neovascularization elsewhere; AREDS age related eye disease study; ARMD age related macular degeneration; POAG primary open angle glaucoma; EBMD epithelial/anterior basement membrane dystrophy; ACIOL anterior chamber intraocular lens; IOL intraocular lens; PCIOL posterior chamber intraocular lens; Phaco/IOL phacoemulsification with intraocular lens placement; Gleason photorefractive keratectomy; LASIK laser assisted in situ keratomileusis; HTN hypertension; DM diabetes mellitus; COPD chronic obstructive pulmonary disease

## 2019-11-14 ENCOUNTER — Other Ambulatory Visit: Payer: Self-pay

## 2019-11-14 ENCOUNTER — Ambulatory Visit (INDEPENDENT_AMBULATORY_CARE_PROVIDER_SITE_OTHER): Payer: Medicare Other | Admitting: Ophthalmology

## 2019-11-14 ENCOUNTER — Encounter (INDEPENDENT_AMBULATORY_CARE_PROVIDER_SITE_OTHER): Payer: Self-pay | Admitting: Ophthalmology

## 2019-11-14 DIAGNOSIS — H35033 Hypertensive retinopathy, bilateral: Secondary | ICD-10-CM

## 2019-11-14 DIAGNOSIS — I10 Essential (primary) hypertension: Secondary | ICD-10-CM | POA: Diagnosis not present

## 2019-11-14 DIAGNOSIS — H3582 Retinal ischemia: Secondary | ICD-10-CM | POA: Diagnosis not present

## 2019-11-14 DIAGNOSIS — H3581 Retinal edema: Secondary | ICD-10-CM | POA: Diagnosis not present

## 2019-11-14 DIAGNOSIS — Z961 Presence of intraocular lens: Secondary | ICD-10-CM

## 2019-11-15 ENCOUNTER — Other Ambulatory Visit: Payer: Self-pay | Admitting: *Deleted

## 2019-11-15 ENCOUNTER — Encounter: Payer: Self-pay | Admitting: Family

## 2019-11-15 ENCOUNTER — Ambulatory Visit (INDEPENDENT_AMBULATORY_CARE_PROVIDER_SITE_OTHER): Payer: Medicare Other | Admitting: Family

## 2019-11-15 DIAGNOSIS — K047 Periapical abscess without sinus: Secondary | ICD-10-CM

## 2019-11-15 MED ORDER — CLINDAMYCIN HCL 300 MG PO CAPS: 300.0000 mg | ORAL_CAPSULE | Freq: Three times a day (TID) | ORAL | 0 refills | Status: AC

## 2019-11-15 MED ORDER — RIVAROXABAN 20 MG PO TABS: 20.0000 mg | ORAL_TABLET | Freq: Every day | ORAL | 0 refills | Status: DC

## 2019-11-15 NOTE — Progress Notes (Signed)
   Virtual Visit via telephone Note Due to COVID-19 pandemic this visit was conducted virtually. This visit type was conducted due to national recommendations for restrictions regarding the COVID-19 Pandemic (e.g. social distancing, sheltering in place) in an effort to limit this patient's exposure and mitigate transmission in our community. All issues noted in this document were discussed and addressed.  A physical exam was not performed with this format.  I connected with Desiree Bowers on 11/15/19 at 9:35 AM by telephone and verified that I am speaking with the correct person using two identifiers. Desiree Bowers is currently located at home and no one is currently with her during visit. The provider, Evelina Dun, FNP is located in their office at time of visit.  I discussed the limitations, risks, security and privacy concerns of performing an evaluation and management service by telephone and the availability of in person appointments. I also discussed with the patient that there may be a patient responsible charge related to this service. The patient expressed understanding and agreed to proceed.   History and Present Illness:  HPI  Pt calls the office with lower front gum and tooth pain that started yesterday. She reports she wears dentures and has partial and her pain is around the partial. She reports she is having 7 out 10 that is worse when she bends over. She has taken tylenol with mild relief.   She does not have a dentist. She report swelling and tenderness.   Denies any fever.   Review of Systems  HENT:       Tooth pain      Observations/Objective: No SOB or distress noted    Assessment and Plan: 1. Abscessed tooth Call dentist and make follow up Keep clean  Report any increased pain, fever, or swelling - clindamycin (CLEOCIN) 300 MG capsule; Take 1 capsule (300 mg total) by mouth 3 (three) times daily for 10 days.  Dispense: 30 capsule; Refill: 0    I discussed  the assessment and treatment plan with the patient. The patient was provided an opportunity to ask questions and all were answered. The patient agreed with the plan and demonstrated an understanding of the instructions.   The patient was advised to call back or seek an in-person evaluation if the symptoms worsen or if the condition fails to improve as anticipated.  The above assessment and management plan was discussed with the patient. The patient verbalized understanding of and has agreed to the management plan. Patient is aware to call the clinic if symptoms persist or worsen. Patient is aware when to return to the clinic for a follow-up visit. Patient educated on when it is appropriate to go to the emergency department.   Time call ended:  9:47 AM   I provided 12 minutes of non-face-to-face time during this encounter.    Evelina Dun, FNP

## 2019-12-06 ENCOUNTER — Encounter: Payer: Self-pay | Admitting: Family Medicine

## 2019-12-06 ENCOUNTER — Other Ambulatory Visit: Payer: Self-pay

## 2019-12-06 ENCOUNTER — Ambulatory Visit (INDEPENDENT_AMBULATORY_CARE_PROVIDER_SITE_OTHER): Payer: Medicare Other | Admitting: Family Medicine

## 2019-12-06 VITALS — BP 159/70 | HR 71 | Temp 97.7°F | Ht 65.0 in | Wt 185.0 lb

## 2019-12-06 DIAGNOSIS — N644 Mastodynia: Secondary | ICD-10-CM | POA: Diagnosis not present

## 2019-12-06 DIAGNOSIS — Z853 Personal history of malignant neoplasm of breast: Secondary | ICD-10-CM | POA: Diagnosis not present

## 2019-12-06 NOTE — Patient Instructions (Signed)

## 2019-12-06 NOTE — Progress Notes (Signed)
Assessment & Plan:  1-2. Pain of right breast/History of right breast cancer - Imaging to be completed at the [facility] in [place], [place]. Education provided on breast tenderness.  - MM DIAG BREAST TOMO BILATERAL; Future - US BREAST LTD UNI RIGHT INC AXILLA; Future   Follow up plan: Return if symptoms worsen or fail to improve.  Hendricks Limes, MSN, APRN, FNP-C [facility]  Subjective:   Patient ID: Desiree Bowers, female    DOB: 24-Nov-1945, 74 y.o.   MRN: [id number]  HPI: Desiree Bowers is a 74 y.o. female presenting on 12/06/2019 for Breast Pain (Patient states that she has been having right breast pain that has been going on a few weeks but has gotten worse the past few days.)  Patient reports the pain she has been having in her right breast feels like a stabbing pain.  She does also have an area under her breast that she showed to her dermatologist.  She states that the dermatology recommended that she follow-up with PCP.  She denies any itching.  Wearing a bra does cause discomfort.  She does have a history of breast cancer.   ROS: Negative unless specifically indicated above in HPI.   Relevant past medical history reviewed and updated as indicated.   Allergies and medications reviewed and updated.   Current Outpatient Medications:    atorvastatin (LIPITOR) 10 MG tablet, Take 1 tablet (10 mg total) by mouth every evening., Disp: 90 tablet, Rfl: 1   benzonatate (TESSALON) 200 MG capsule, Take 1 capsule (200 mg total) by mouth 3 (three) times daily as needed., Disp: 30 capsule, Rfl: 1   budesonide-formoterol (SYMBICORT) 80-4.5 MCG/ACT inhaler, Inhale 2 puffs into the lungs 2 (two) times daily., Disp: 1 Inhaler, Rfl: 3   calcium carbonate (OSCAL) 1500 (600 Ca) MG TABS tablet, Take 2 tablets by mouth daily. , Disp: , Rfl:    cetirizine (ZYRTEC) 10 MG tablet, Take 1 tablet (10 mg total) by mouth daily., Disp: 90 tablet, Rfl: 1   cyclobenzaprine  (FLEXERIL) 10 MG tablet, Take 10 mg by mouth 3 (three) times daily as needed for muscle spasms., Disp: , Rfl:    famotidine (PEPCID) 20 MG tablet, Take 20 mg by mouth daily., Disp: , Rfl:    furosemide (LASIX) 20 MG tablet, Take 20 mg by mouth daily as needed for edema. , Disp: , Rfl:    losartan-hydrochlorothiazide (HYZAAR) 100-25 MG tablet, Take 1 tablet by mouth daily., Disp: 90 tablet, Rfl: 1   magnesium oxide (MAG-OX) 400 (241.3 Mg) MG tablet, Take 400 mg by mouth daily. , Disp: , Rfl:    methocarbamol (ROBAXIN) 500 MG tablet, Take 1-2 tablets (500-1,000 mg total) by mouth every 8 (eight) hours as needed for muscle spasms., Disp: 30 tablet, Rfl: 2   metoprolol tartrate (LOPRESSOR) 25 MG tablet, Take 0.5 tablets (12.5 mg total) by mouth 2 (two) times daily., Disp: 30 tablet, Rfl: 1   oxybutynin (DITROPAN-XL) 10 MG 24 hr tablet, TAKE ONE (1) TABLET EACH DAY, Disp: 90 tablet, Rfl: 0   pramipexole (MIRAPEX) 0.125 MG tablet, Take 1 tablet (0.125 mg total) by mouth 2 (two) times daily., Disp: 180 tablet, Rfl: 1   rivaroxaban (XARELTO) 20 MG TABS tablet, Take 1 tablet (20 mg total) by mouth daily with supper., Disp: 28 tablet, Rfl: 0   sertraline (ZOLOFT) 25 MG tablet, Take 25 mg by mouth daily. Take 1.5 tablets daily, Disp: , Rfl:    SYNTHROID  112 MCG tablet, Take 112 mcg by mouth daily before breakfast. , Disp: , Rfl:    triamcinolone cream (KENALOG) 0.1 %, Apply 1 application topically daily as needed., Disp: , Rfl:   Allergies  Allergen Reactions   Mirapex [Pramipexole Dihydrochloride] Itching and Swelling   Penicillins Rash   Gabapentin Other (See Comments)    Couldn't move legs during the night after taking.   Levothyroxine Itching    Objective:   BP (!) 159/70    Pulse 71    Temp 97.7 F (36.5 C) (Temporal)    Ht 5\' 5"  (1.651 m)    Wt 185 lb (83.9 kg)    SpO2 96%    BMI 30.79 kg/m    Physical Exam Vitals reviewed. Exam conducted with a chaperone present.    Constitutional:      General: She is not in acute distress.    Appearance: Normal appearance. She is obese. She is not ill-appearing, toxic-appearing or diaphoretic.  HENT:     Head: Normocephalic and atraumatic.  Eyes:     General: No scleral icterus.       Right eye: No discharge.        Left eye: No discharge.     Conjunctiva/sclera: Conjunctivae normal.  Cardiovascular:     Rate and Rhythm: Normal rate.  Pulmonary:     Effort: Pulmonary effort is normal. No respiratory distress.  Chest:     Breasts:        Right: No swelling or bleeding.        Left: Normal.     Comments: Previous breast surgery resulted in scars to right breast and nipple removal. The stabbing pain in in that area. She does have petechial appearing spots mostly under the right breast, with a few in the middle of her breast.  Musculoskeletal:        General: Normal range of motion.     Cervical back: Normal range of motion.  Skin:    General: Skin is warm and dry.     Capillary Refill: Capillary refill takes less than 2 seconds.  Neurological:     General: No focal deficit present.     Mental Status: She is alert and oriented to person, place, and time. Mental status is at baseline.  Psychiatric:        Mood and Affect: Mood normal.        Behavior: Behavior normal.        Thought Content: Thought content normal.        Judgment: Judgment normal.

## 2019-12-26 ENCOUNTER — Encounter: Payer: Self-pay | Admitting: Family Medicine

## 2019-12-26 ENCOUNTER — Other Ambulatory Visit: Payer: Self-pay

## 2019-12-26 ENCOUNTER — Ambulatory Visit (INDEPENDENT_AMBULATORY_CARE_PROVIDER_SITE_OTHER): Payer: Medicare Other | Admitting: Family Medicine

## 2019-12-26 DIAGNOSIS — R05 Cough: Secondary | ICD-10-CM

## 2019-12-26 DIAGNOSIS — R053 Chronic cough: Secondary | ICD-10-CM

## 2019-12-26 MED ORDER — LEVOFLOXACIN 500 MG PO TABS
500.0000 mg | ORAL_TABLET | Freq: Every day | ORAL | 0 refills | Status: DC
Start: 2019-12-26 — End: 2020-01-08

## 2019-12-26 MED ORDER — PREDNISONE 10 MG PO TABS
ORAL_TABLET | ORAL | 0 refills | Status: DC
Start: 2019-12-26 — End: 2020-01-14

## 2019-12-26 MED ORDER — BREO ELLIPTA 200-25 MCG/INH IN AEPB
1.0000 | INHALATION_SPRAY | Freq: Every day | RESPIRATORY_TRACT | 11 refills | Status: DC
Start: 2019-12-26 — End: 2020-10-02

## 2019-12-26 NOTE — Progress Notes (Signed)
Subjective:    Patient ID: Desiree Bowers, female    DOB: [birth year], 74 y.o.   MRN: [id number]   HPI: Desiree Bowers is a 74 y.o. female presenting  a cough that you cannot get rid of. Ongoing for a year. Has used symbicort, but is allergic.After she used it on 2 occasions last week her head started itching so she stopped it and took a Zyrtec the itching went away.  It has not returned and she has not used the Symbicort anymore.  Occasionally productive, but clear. Denies dyspnea. Had two rounds of prednisone. Helped a little.  Patient says she stopped smoking over 40 years ago.  Depression screen [facility] 2/9 12/06/2019 10/25/2019 08/07/2019 07/17/2019 04/26/2019  Decreased Interest 0 0 0 0 0  Down, Depressed, Hopeless 0 0 0 0 0  PHQ - 2 Score 0 0 0 0 0  Altered sleeping - - - - 0  Tired, decreased energy - - - - 0  Change in appetite - - - - 0  Feeling bad or failure about yourself  - - - - 0  Trouble concentrating - - - - 0  Moving slowly or fidgety/restless - - - - 1  Suicidal thoughts - - - - 0  PHQ-9 Score - - - - 1  Difficult doing work/chores - - - - Not difficult at all     Relevant past medical, surgical, family and social history reviewed and updated as indicated.  Interim medical history since our last visit reviewed. Allergies and medications reviewed and updated.  ROS:  Review of Systems  Constitutional: Negative.  Negative for fever.  HENT: Negative for congestion.   Respiratory: Positive for cough. Negative for chest tightness and shortness of breath.   Cardiovascular: Negative for chest pain and palpitations.  Skin: Negative for rash.     Social History   Tobacco Use  Smoking Status Former Smoker  Smokeless Tobacco Never Used       Objective:     Wt Readings from Last 3 Encounters:  12/06/19 185 lb (83.9 kg)  10/25/19 185 lb (83.9 kg)  09/27/19 180 lb (81.6 kg)     Exam deferred. Pt. Harboring due to COVID 19. Phone visit performed.   Assessment &  Plan:   1. Chronic cough     Meds ordered this encounter  Medications  . levofloxacin (LEVAQUIN) 500 MG tablet    Sig: Take 1 tablet (500 mg total) by mouth daily.    Dispense:  7 tablet    Refill:  0  . predniSONE (DELTASONE) 10 MG tablet    Sig: Take 5 daily for 3 days followed by 4,3,2 and 1 for 3 days each.    Dispense:  45 tablet    Refill:  0  . fluticasone furoate-vilanterol (BREO ELLIPTA) 200-25 MCG/INH AEPB    Sig: Inhale 1 puff into the lungs daily.    Dispense:  1 each    Refill:  11    No orders of the defined types were placed in this encounter.     Diagnoses and all orders for this visit:  Chronic cough  Other orders -     levofloxacin (LEVAQUIN) 500 MG tablet; Take 1 tablet (500 mg total) by mouth daily. -     predniSONE (DELTASONE) 10 MG tablet; Take 5 daily for 3 days followed by 4,3,2 and 1 for 3 days each. -     fluticasone furoate-vilanterol (BREO ELLIPTA) 200-25 MCG/INH AEPB; Inhale  1 puff into the lungs daily.    Virtual Visit via telephone Note  I discussed the limitations, risks, security and privacy concerns of performing an evaluation and management service by telephone and the availability of in person appointments. The patient was identified with two identifiers. Pt.expressed understanding and agreed to proceed. Pt. Is at home. Dr. Livia Snellen is in his office.  Follow Up Instructions:   I discussed the assessment and treatment plan with the patient. The patient was provided an opportunity to ask questions and all were answered. The patient agreed with the plan and demonstrated an understanding of the instructions.   The patient was advised to call back or seek an in-person evaluation if the symptoms worsen or if the condition fails to improve as anticipated.   Total minutes including chart review and phone contact time: 12   Follow up plan: No follow-ups on file.  Claretta Fraise, MD [place]

## 2020-01-01 ENCOUNTER — Encounter: Payer: Self-pay | Admitting: Cardiology

## 2020-01-01 ENCOUNTER — Ambulatory Visit (INDEPENDENT_AMBULATORY_CARE_PROVIDER_SITE_OTHER): Payer: Medicare Other | Admitting: Cardiology

## 2020-01-01 VITALS — BP 153/67 | HR 75 | Ht 65.0 in | Wt 187.0 lb

## 2020-01-01 DIAGNOSIS — I480 Paroxysmal atrial fibrillation: Secondary | ICD-10-CM | POA: Diagnosis not present

## 2020-01-01 DIAGNOSIS — H539 Unspecified visual disturbance: Secondary | ICD-10-CM | POA: Diagnosis not present

## 2020-01-01 DIAGNOSIS — I10 Essential (primary) hypertension: Secondary | ICD-10-CM

## 2020-01-01 MED ORDER — METOPROLOL TARTRATE 25 MG PO TABS: 25.0000 mg | ORAL_TABLET | Freq: Two times a day (BID) | ORAL | 2 refills | Status: DC

## 2020-01-01 NOTE — Progress Notes (Signed)
Clinical Summary Desiree Bowers is a 74 y.o.female seen today for follow up of the following medical problems.      1. PAF -08/2019 monitor showed episodes of PAF - some palpitations at times.     2. Vision changes - seen in ER 08/29/19 with acute vision change - MRI without acute findings - seen by optho, diagnosed with retinal ischemia.  - optho has asked for echo and carotid dopplers.  - may have been related to her PAF which just diagnosed by monitor.    3. Valvular heart disease - 02/2019 echo with mild to mod MR, mod TR,  - no recent symptoms  4. HTN - currently on prednisone - recheck 145/75  Past Medical History:  Diagnosis Date  . [facility])    breast   . Coronary artery disease due to lipid rich plaque 04/13/2016  . Diabetes mellitus without complication ([place])   . Diverticulosis of intestine without bleeding 12/13/2016  . History of right mastectomy 11/15/2014  . Hypothyroidism   . Overactive bladder   . Paget's disease of the bone   . Paroxysmal atrial fibrillation ([place]) 06/19/2014  . Restless leg syndrome      Allergies  Allergen Reactions  . Mirapex [Pramipexole Dihydrochloride] Itching and Swelling  . Penicillins Rash  . Gabapentin Other (See Comments)    Couldn't move legs during the night after taking.  . Levothyroxine Itching  . Symbicort [Budesonide-Formoterol Fumarate] Itching     Current Outpatient Medications  Medication Sig Dispense Refill  . atorvastatin (LIPITOR) 10 MG tablet Take 1 tablet (10 mg total) by mouth every evening. 90 tablet 1  . benzonatate (TESSALON) 200 MG capsule Take 1 capsule (200 mg total) by mouth 3 (three) times daily as needed. 30 capsule 1  . calcium carbonate (OSCAL) 1500 (600 Ca) MG TABS tablet Take 2 tablets by mouth daily.     . cetirizine (ZYRTEC) 10 MG tablet Take 1 tablet (10 mg total) by mouth daily. 90 tablet 1  . cyclobenzaprine (FLEXERIL) 10 MG tablet Take 10 mg by mouth 3 (three) times daily as needed  for muscle spasms.    . famotidine (PEPCID) 20 MG tablet Take 20 mg by mouth daily.    . fluticasone furoate-vilanterol (BREO ELLIPTA) 200-25 MCG/INH AEPB Inhale 1 puff into the lungs daily. 1 each 11  . furosemide (LASIX) 20 MG tablet Take 20 mg by mouth daily as needed for edema.     Marland Kitchen levofloxacin (LEVAQUIN) 500 MG tablet Take 1 tablet (500 mg total) by mouth daily. 7 tablet 0  . losartan-hydrochlorothiazide (HYZAAR) 100-25 MG tablet Take 1 tablet by mouth daily. 90 tablet 1  . magnesium oxide (MAG-OX) 400 (241.3 Mg) MG tablet Take 400 mg by mouth daily.     . methocarbamol (ROBAXIN) 500 MG tablet Take 1-2 tablets (500-1,000 mg total) by mouth every 8 (eight) hours as needed for muscle spasms. 30 tablet 2  . metoprolol tartrate (LOPRESSOR) 25 MG tablet Take 0.5 tablets (12.5 mg total) by mouth 2 (two) times daily. 30 tablet 1  . oxybutynin (DITROPAN-XL) 10 MG 24 hr tablet TAKE ONE (1) TABLET EACH DAY 90 tablet 0  . pramipexole (MIRAPEX) 0.125 MG tablet Take 1 tablet (0.125 mg total) by mouth 2 (two) times daily. 180 tablet 1  . predniSONE (DELTASONE) 10 MG tablet Take 5 daily for 3 days followed by 4,3,2 and 1 for 3 days each. 45 tablet 0  . rivaroxaban (XARELTO) 20 MG TABS  tablet Take 1 tablet (20 mg total) by mouth daily with supper. 28 tablet 0  . sertraline (ZOLOFT) 25 MG tablet Take 25 mg by mouth daily. Take 1.5 tablets daily    . SYNTHROID 112 MCG tablet Take 112 mcg by mouth daily before breakfast.     . triamcinolone cream (KENALOG) 0.1 % Apply 1 application topically daily as needed.     No current facility-administered medications for this visit.     Past Surgical History:  Procedure Laterality Date  . ABDOMINAL HYSTERECTOMY  1999  . APPENDECTOMY     as a child  . CATARACT EXTRACTION Bilateral Fall 2013   Dr. Charise Killian  . CATARACT EXTRACTION, BILATERAL    . EYE SURGERY Bilateral Fall 2013   Cat Sx - Dr. Charise Killian  . MASTECTOMY Right      Allergies  Allergen Reactions  .  Mirapex [Pramipexole Dihydrochloride] Itching and Swelling  . Penicillins Rash  . Gabapentin Other (See Comments)    Couldn't move legs during the night after taking.  . Levothyroxine Itching  . Symbicort [Budesonide-Formoterol Fumarate] Itching      Family History  Problem Relation Age of Onset  . Breast cancer Mother   . Colon cancer Father   . Kidney cancer Father   . Bladder Cancer Sister   . Suicidality Brother   . Heart attack Maternal Grandmother   . Breast cancer Sister   . Stomach cancer Sister   . Ovarian cancer Sister   . Breast cancer Sister   . Breast cancer Sister   . Diabetes Sister   . Other Brother        parathyroid disorder; Engelmann's disease  . Angelman syndrome Son   . Graves' disease Daughter      Social History Ms. Costanza reports that she has quit smoking. She has never used smokeless tobacco. Ms. Parran reports no history of alcohol use.   Review of Systems CONSTITUTIONAL: No weight loss, fever, chills, weakness or fatigue.  HEENT: Eyes: No visual loss, blurred vision, double vision or yellow sclerae.No hearing loss, sneezing, congestion, runny nose or sore throat.  SKIN: No rash or itching.  CARDIOVASCULAR: per hpi RESPIRATORY: No shortness of breath, cough or sputum.  GASTROINTESTINAL: No anorexia, nausea, vomiting or diarrhea. No abdominal pain or blood.  GENITOURINARY: No burning on urination, no polyuria NEUROLOGICAL: No headache, dizziness, syncope, paralysis, ataxia, numbness or tingling in the extremities. No change in bowel or bladder control.  MUSCULOSKELETAL: No muscle, back pain, joint pain or stiffness.  LYMPHATICS: No enlarged nodes. No history of splenectomy.  PSYCHIATRIC: No history of depression or anxiety.  ENDOCRINOLOGIC: No reports of sweating, cold or heat intolerance. No polyuria or polydipsia.  Marland Kitchen   Physical Examination Today's Vitals   01/01/20 1336  BP: (!) 153/67  Pulse: 75  SpO2: 99%  Weight: 187 lb (84.8 kg)    Height: 5\' 5"  (1.651 m)   Body mass index is 31.12 kg/m.  Gen: resting comfortably, no acute distress HEENT: no scleral icterus, pupils equal round and reactive, no palptable cervical adenopathy,  CV: RRR, 2/6 systolic murmur apex, no jvd Resp: Clear to auscultation bilaterally GI: abdomen is soft, non-tender, non-distended, normal bowel sounds, no hepatosplenomegaly MSK: extremities are warm, no edema.  Skin: warm, no rash Neuro:  no focal deficits Psych: appropriate affect   Diagnostic Studies 08/2015 cath [facility] FINDINGS:  Coronary Angiography 1. Left Main - Normal 2. Left anterior descending artery - the LAD gives off a small first  diagonal and a large second diagonal, normal. 3. Diagonals - Normal 4. Left Circumflex - the circumflex gives off a large OM1, small OM 2, and moderately large OM 3, normal. 5. Obtuse Marginals - Normal 6. Right Coronary Artery - the RCA gives off the PDA and small PLV branches, normal.There is a conus Carmellia Kreisler that originates on the aorta, dual lumen with the RCA. 7. Posterior Descending Artery - Normal  Additional comments on angiography: Right Dominance  1.Normal coronary arteries. 2.LVEDP 20 3.RRA access.There was spasm of the radial artery near the level of the elbow so access was changed from [place] to right femoral artery.RFA was closed with Perclose vascular suture device. 4. No Complications.   RECOMMENDATIONS:  1. Continue workup for etiology of current symptoms. 2. Followup with Dr. Hamilton Capri for with her PCP. 3. Continue risk factor modification  10/2019 echo IMPRESSIONS    1. Left ventricular ejection fraction, by estimation, is 55 to 60%. The  left ventricle has normal function. The left ventricle has no regional  wall motion abnormalities. There is mild left ventricular hypertrophy of  the basal-septal segment. Left  ventricular diastolic parameters are consistent with Grade I diastolic  dysfunction (impaired  relaxation).  2. Right ventricular systolic function is low normal. The right  ventricular size is normal. There is normal pulmonary artery systolic  pressure.  3. The mitral valve is grossly normal. Mild to moderate mitral valve  regurgitation.  4. Tricuspid valve regurgitation is moderate.  5. The aortic valve is tricuspid. Aortic valve regurgitation is trivial.  No aortic stenosis is present.  6. The inferior vena cava is normal in size with greater than 50%  respiratory variability, suggesting right atrial pressure of 3 mmHg.   Assessment and Plan  1.PAF - confirmed diagnosis by recent monitor, perhaps the etiology of her recent retinal ischemia - some recent palpitations, increase lopressor to 25mg  bid - eliquis and xarelto were too expensive, she stopped xarelto.  - discussed in detail that aspirin is not sufficient to lower her stroke risk, will start coumadin and stop ASA. Refer to coumadin clinic.    2. Vision change - seen by optho, signs of retinal ischemia. Perhaps related to newly diagnosed PAF  3. Valvular heart disease - mild to mod MR and mod TR, monitor at this time. Asymptomatic.   4. HTN - manual recheck 145/75, she is on prednisone - monitor at this time, if consistently high off prednisone may need further changes  F/u 3 months  Arnoldo Lenis, M.D.

## 2020-01-01 NOTE — Patient Instructions (Addendum)
Medication Instructions:   Your physician has recommended you make the following change in your medication:   Increase metoprolol to 25 mg by mouth twice daily  You will be starting coumadin once you are seen by the nurse Lattie Haw)  When you start warfarin, you will stop taking aspirin  Labwork:  NONE  Testing/Procedures:  NONE  Follow-Up:  Your physician recommends that you schedule a follow-up appointment in: 3 months  You have been referred to the Coumadin Clinic  Any Other Special Instructions Will Be Listed Below (If Applicable).  If you need a refill on your cardiac medications before your next appointment, please call your pharmacy.

## 2020-01-08 ENCOUNTER — Ambulatory Visit (INDEPENDENT_AMBULATORY_CARE_PROVIDER_SITE_OTHER): Payer: Medicare Other | Admitting: *Deleted

## 2020-01-08 DIAGNOSIS — Z5181 Encounter for therapeutic drug level monitoring: Secondary | ICD-10-CM

## 2020-01-08 DIAGNOSIS — I480 Paroxysmal atrial fibrillation: Secondary | ICD-10-CM | POA: Diagnosis not present

## 2020-01-08 LAB — POCT INR: INR: 1 — AB (ref 2.0–3.0)

## 2020-01-08 MED ORDER — WARFARIN SODIUM 5 MG PO TABS: 5.0000 mg | ORAL_TABLET | Freq: Every day | ORAL | 1 refills | Status: DC

## 2020-01-08 NOTE — Patient Instructions (Signed)
Here to start warfarin today for atrial fib Take warfarin 1 tablet daily Recheck on Monday 01/14/20

## 2020-01-14 ENCOUNTER — Ambulatory Visit (INDEPENDENT_AMBULATORY_CARE_PROVIDER_SITE_OTHER): Payer: Medicare Other | Admitting: *Deleted

## 2020-01-14 DIAGNOSIS — Z5181 Encounter for therapeutic drug level monitoring: Secondary | ICD-10-CM | POA: Diagnosis not present

## 2020-01-14 DIAGNOSIS — I480 Paroxysmal atrial fibrillation: Secondary | ICD-10-CM | POA: Diagnosis not present

## 2020-01-14 LAB — POCT INR: INR: 2.8 (ref 2.0–3.0)

## 2020-01-14 NOTE — Patient Instructions (Signed)
Decrease warfarin to 1 tablet daily except 1/2 tablet on Mondays and Thursdays Recheck in 1 week

## 2020-01-22 ENCOUNTER — Ambulatory Visit (INDEPENDENT_AMBULATORY_CARE_PROVIDER_SITE_OTHER): Payer: Medicare Other | Admitting: *Deleted

## 2020-01-22 DIAGNOSIS — Z5181 Encounter for therapeutic drug level monitoring: Secondary | ICD-10-CM | POA: Diagnosis not present

## 2020-01-22 DIAGNOSIS — I480 Paroxysmal atrial fibrillation: Secondary | ICD-10-CM | POA: Diagnosis not present

## 2020-01-22 LAB — POCT INR: INR: 3.7 — AB (ref 2.0–3.0)

## 2020-01-22 NOTE — Patient Instructions (Signed)
Hold warfarin tonight then decrease dose to 1/2 tablet daily except 1 tablet on Sundays, Tuesdays and Thursdays Recheck in 1 week

## 2020-01-25 ENCOUNTER — Ambulatory Visit (INDEPENDENT_AMBULATORY_CARE_PROVIDER_SITE_OTHER): Payer: Medicare Other | Admitting: Family Medicine

## 2020-01-25 ENCOUNTER — Encounter: Payer: Self-pay | Admitting: Family Medicine

## 2020-01-25 ENCOUNTER — Other Ambulatory Visit: Payer: Self-pay

## 2020-01-25 VITALS — BP 141/70 | HR 70 | Temp 97.6°F | Ht 65.0 in | Wt 186.2 lb

## 2020-01-25 DIAGNOSIS — E785 Hyperlipidemia, unspecified: Secondary | ICD-10-CM

## 2020-01-25 DIAGNOSIS — M8949 Other hypertrophic osteoarthropathy, multiple sites: Secondary | ICD-10-CM | POA: Diagnosis not present

## 2020-01-25 DIAGNOSIS — G2581 Restless legs syndrome: Secondary | ICD-10-CM

## 2020-01-25 DIAGNOSIS — M159 Polyosteoarthritis, unspecified: Secondary | ICD-10-CM

## 2020-01-25 DIAGNOSIS — Z23 Encounter for immunization: Secondary | ICD-10-CM

## 2020-01-25 DIAGNOSIS — E1169 Type 2 diabetes mellitus with other specified complication: Secondary | ICD-10-CM

## 2020-01-25 DIAGNOSIS — I10 Essential (primary) hypertension: Secondary | ICD-10-CM

## 2020-01-25 LAB — BAYER DCA HB A1C WAIVED: HB A1C (BAYER DCA - WAIVED): 6.7 % (ref <7.0)

## 2020-01-25 MED ORDER — DICLOFENAC SODIUM 1 % EX GEL: 2.0000 g | Freq: Four times a day (QID) | CUTANEOUS | 2 refills | Status: DC

## 2020-01-25 MED ORDER — PRAMIPEXOLE DIHYDROCHLORIDE 0.25 MG PO TABS: 0.2500 mg | ORAL_TABLET | Freq: Two times a day (BID) | ORAL | 1 refills | Status: DC

## 2020-01-25 NOTE — Progress Notes (Signed)
Assessment & Plan:  1. DM type 2 with diabetic dyslipidemia ([place]) Lab Results  Component Value Date   HGBA1C 6.7 01/25/2020   HGBA1C 6.6 10/25/2019   HGBA1C 6.0 07/17/2019  - Diabetes is at goal of A1c < 7. - Medications: none - diet controlled - Patient is currently taking a statin. Patient is taking an ACE-inhibitor/ARB.  - Last foot exam: 04/26/2019 - Last diabetic eye exam: 09/19/2019 - Urine Microalbumin/Creat Ratio: today - Bayer DCA Hb A1c Waived - Microalbumin / creatinine urine ratio - CMP14+EGFR - Lipid panel  2. Essential (primary) hypertension - Well controlled on current regimen.  - CMP14+EGFR - Lipid panel  3. Restless leg syndrome - Uncontrolled. Mirapex increased from 0.125 mg to 0.25 mg BID.  - pramipexole (MIRAPEX) 0.25 MG tablet; Take 1 tablet (0.25 mg total) by mouth 2 (two) times daily.  Dispense: 180 tablet; Refill: 1 - CMP14+EGFR  4. Primary osteoarthritis involving multiple joints - Encouraged patient to take Tylenol more routinely if needed for pain since it is helpful. Advised if she wants an NSAID to use Voltaren gel.  - diclofenac Sodium (VOLTAREN) 1 % GEL; Apply 2 g topically 4 (four) times daily.  Dispense: 350 g; Refill: 2 - CMP14+EGFR  5. Immunization due - Varicella-zoster vaccine IM (Shingrix)   Return in about 6 months (around 07/27/2020) for follow-up of chronic medication conditions.  Hendricks Limes, MSN, APRN, FNP-C [facility]  Subjective:    Patient ID: Desiree Bowers, female    DOB: 1946-05-29, 74 y.o.   MRN: [id number]  Patient Care Team: Loman Brooklyn, FNP as PCP - General (Family Medicine) Harl Bowie Alphonse Guild, MD as PCP - Cardiology (Cardiology) Delrae Rend, MD as Consulting Physician (Endocrinology) Sandford Craze, MD as Referring Physician (Dermatology)   Chief Complaint:  Chief Complaint  Patient presents with  . Diabetes    3 month follow up of chronic medical conditons  . restless  leg    ongoing and patient states it has been worse this week    HPI: Desiree Bowers is a 74 y.o. female presenting on 01/25/2020 for Diabetes (3 month follow up of chronic medical conditons) and restless leg (ongoing and patient states it has been worse this week)  Diabetes: Patient presents for follow up of diabetes. Current symptoms include: none. Known diabetic complications: cardiovascular disease. Medication compliance: diet controlled. Current diet: in general, a "healthy" diet  . Current exercise: walking. Is she  on ACE inhibitor or angiotensin II receptor blocker? Yes. Is she on a statin? Yes.   Lab Results  Component Value Date   HGBA1C 6.7 01/25/2020   HGBA1C 6.6 10/25/2019   HGBA1C 6.0 07/17/2019   Lab Results  Component Value Date   LDLCALC 114 (H) 01/25/2020   CREATININE 0.79 01/25/2020     Patient reports her restless legs have been bothering her more for the past week or so. If she takes 1.5 tablets twice daily of the Mirapex she feels better.    New complaints: Patient wants to know what to take for arthritis pains. She use to take meloxicam but was taken off of this due to heart conditions. She takes Tylenol 2 tablets once daily which is helpful.    Social history:  Relevant past medical, surgical, family and social history reviewed and updated as indicated. Interim medical history since our last visit reviewed.  Allergies and medications reviewed and updated.  DATA REVIEWED: CHART IN EPIC  ROS: Negative unless specifically indicated  above in HPI.    Current Outpatient Medications:  .  atorvastatin (LIPITOR) 10 MG tablet, Take 1 tablet (10 mg total) by mouth every evening., Disp: 90 tablet, Rfl: 1 .  calcium carbonate (OSCAL) 1500 (600 Ca) MG TABS tablet, Take 2 tablets by mouth daily. , Disp: , Rfl:  .  cetirizine (ZYRTEC) 10 MG tablet, Take 1 tablet (10 mg total) by mouth daily., Disp: 90 tablet, Rfl: 1 .  cyclobenzaprine (FLEXERIL) 10 MG tablet, Take 10  mg by mouth 3 (three) times daily as needed for muscle spasms., Disp: , Rfl:  .  famotidine (PEPCID) 20 MG tablet, Take 20 mg by mouth daily., Disp: , Rfl:  .  fluticasone furoate-vilanterol (BREO ELLIPTA) 200-25 MCG/INH AEPB, Inhale 1 puff into the lungs daily., Disp: 1 each, Rfl: 11 .  furosemide (LASIX) 20 MG tablet, Take 20 mg by mouth daily as needed for edema. , Disp: , Rfl:  .  losartan-hydrochlorothiazide (HYZAAR) 100-25 MG tablet, Take 1 tablet by mouth daily., Disp: 90 tablet, Rfl: 1 .  magnesium oxide (MAG-OX) 400 (241.3 Mg) MG tablet, Take 400 mg by mouth daily. , Disp: , Rfl:  .  methocarbamol (ROBAXIN) 500 MG tablet, Take 1-2 tablets (500-1,000 mg total) by mouth every 8 (eight) hours as needed for muscle spasms., Disp: 30 tablet, Rfl: 2 .  metoprolol tartrate (LOPRESSOR) 25 MG tablet, Take 1 tablet (25 mg total) by mouth 2 (two) times daily., Disp: 180 tablet, Rfl: 2 .  oxybutynin (DITROPAN-XL) 10 MG 24 hr tablet, TAKE ONE (1) TABLET EACH DAY, Disp: 90 tablet, Rfl: 0 .  pramipexole (MIRAPEX) 0.125 MG tablet, Take 1 tablet (0.125 mg total) by mouth 2 (two) times daily., Disp: 180 tablet, Rfl: 1 .  sertraline (ZOLOFT) 25 MG tablet, Take 25 mg by mouth daily. Take 1.5 tablets daily, Disp: , Rfl:  .  SYNTHROID 112 MCG tablet, Take 112 mcg by mouth daily before breakfast. , Disp: , Rfl:  .  triamcinolone cream (KENALOG) 0.1 %, Apply 1 application topically daily as needed., Disp: , Rfl:  .  warfarin (COUMADIN) 5 MG tablet, Take 1 tablet (5 mg total) by mouth daily., Disp: 30 tablet, Rfl: 1   Allergies  Allergen Reactions  . Mirapex [Pramipexole Dihydrochloride] Itching and Swelling  . Penicillins Rash  . Gabapentin Other (See Comments)    Couldn't move legs during the night after taking.  . Levothyroxine Itching  . Symbicort [Budesonide-Formoterol Fumarate] Itching   Past Medical History:  Diagnosis Date  . Cancer [facility])    breast   . Coronary artery disease due to lipid rich  plaque 04/13/2016  . Diabetes mellitus without complication ([place])   . Diverticulosis of intestine without bleeding 12/13/2016  . History of right mastectomy 11/15/2014  . Hypothyroidism   . Overactive bladder   . Paget's disease of the bone   . Paroxysmal atrial fibrillation ([place]) 06/19/2014  . Restless leg syndrome     Past Surgical History:  Procedure Laterality Date  . ABDOMINAL HYSTERECTOMY  1999  . APPENDECTOMY     as a child  . CATARACT EXTRACTION Bilateral Fall 2013   Dr. Charise Killian  . CATARACT EXTRACTION, BILATERAL    . EYE SURGERY Bilateral Fall 2013   Cat Sx - Dr. Charise Killian  . MASTECTOMY Right     Social History   Socioeconomic History  . Marital status: Widowed    Spouse name: Not on file  . Number of children: 4  . Years of education:  Not on file  . Highest education level: GED or equivalent  Occupational History  . Occupation: Retired  Tobacco Use  . Smoking status: Former Research scientist (life sciences)  . Smokeless tobacco: Never Used  Vaping Use  . Vaping Use: Never used  Substance and Sexual Activity  . Alcohol use: No  . Drug use: No  . Sexual activity: Not Currently  Other Topics Concern  . Not on file  Social History Narrative  . Not on file   Social Determinants of Health   Financial Resource Strain:   . Difficulty of Paying Living Expenses: Not on file  Food Insecurity:   . Worried About Charity fundraiser in the Last Year: Not on file  . Ran Out of Food in the Last Year: Not on file  Transportation Needs:   . Lack of Transportation (Medical): Not on file  . Lack of Transportation (Non-Medical): Not on file  Physical Activity:   . Days of Exercise per Week: Not on file  . Minutes of Exercise per Session: Not on file  Stress:   . Feeling of Stress : Not on file  Social Connections:   . Frequency of Communication with Friends and Family: Not on file  . Frequency of Social Gatherings with Friends and Family: Not on file  . Attends Religious Services: Not on file  . Active  Member of Clubs or Organizations: Not on file  . Attends Archivist Meetings: Not on file  . Marital Status: Not on file  Intimate Partner Violence:   . Fear of Current or Ex-Partner: Not on file  . Emotionally Abused: Not on file  . Physically Abused: Not on file  . Sexually Abused: Not on file        Objective:    BP (!) 141/70   Pulse 70   Temp 97.6 F (36.4 C) (Temporal)   Ht 5' 5"  (1.651 m)   Wt 186 lb 3.2 oz (84.5 kg)   SpO2 96%   BMI 30.99 kg/m   Wt Readings from Last 3 Encounters:  01/25/20 186 lb 3.2 oz (84.5 kg)  01/01/20 187 lb (84.8 kg)  12/06/19 185 lb (83.9 kg)    Physical Exam Vitals reviewed.  Constitutional:      General: She is not in acute distress.    Appearance: Normal appearance. She is obese. She is not ill-appearing, toxic-appearing or diaphoretic.  HENT:     Head: Normocephalic and atraumatic.  Eyes:     General: No scleral icterus.       Right eye: No discharge.        Left eye: No discharge.     Conjunctiva/sclera: Conjunctivae normal.  Cardiovascular:     Rate and Rhythm: Normal rate and regular rhythm.     Heart sounds: Normal heart sounds. No murmur heard.  No friction rub. No gallop.   Pulmonary:     Effort: Pulmonary effort is normal. No respiratory distress.     Breath sounds: Normal breath sounds. No stridor. No wheezing, rhonchi or rales.  Musculoskeletal:        General: Normal range of motion.     Cervical back: Normal range of motion.  Skin:    General: Skin is warm and dry.     Capillary Refill: Capillary refill takes less than 2 seconds.  Neurological:     General: No focal deficit present.     Mental Status: She is alert and oriented to person, place, and time. Mental status is  at baseline.  Psychiatric:        Mood and Affect: Mood normal.        Behavior: Behavior normal.        Thought Content: Thought content normal.        Judgment: Judgment normal.     Lab Results  Component Value Date   TSH  1.390 10/25/2019   Lab Results  Component Value Date   WBC 3.6 10/25/2019   HGB 11.1 10/25/2019   HCT 34.8 10/25/2019   MCV 80 10/25/2019   PLT 346 10/25/2019   Lab Results  Component Value Date   NA 140 10/25/2019   K 4.5 10/25/2019   CO2 21 10/25/2019   GLUCOSE 109 (H) 10/25/2019   BUN 17 10/25/2019   CREATININE 0.92 10/25/2019   BILITOT 0.2 10/25/2019   ALKPHOS 107 10/25/2019   AST 21 10/25/2019   ALT 17 10/25/2019   PROT 7.0 10/25/2019   ALBUMIN 3.9 10/25/2019   CALCIUM 9.3 10/25/2019   ANIONGAP 10 08/29/2019   Lab Results  Component Value Date   CHOL 178 10/25/2019   Lab Results  Component Value Date   HDL 40 10/25/2019   Lab Results  Component Value Date   LDLCALC 111 (H) 10/25/2019   Lab Results  Component Value Date   TRIG 152 (H) 10/25/2019   Lab Results  Component Value Date   CHOLHDL 4.5 (H) 10/25/2019   Lab Results  Component Value Date   HGBA1C 6.6 10/25/2019

## 2020-01-26 ENCOUNTER — Other Ambulatory Visit: Payer: Self-pay | Admitting: Family Medicine

## 2020-01-26 LAB — SPECIMEN STATUS

## 2020-01-26 LAB — MICROALBUMIN / CREATININE URINE RATIO
Creatinine, Urine: 80.7 mg/dL
Microalb/Creat Ratio: 8 mg/g creat (ref 0–29)
Microalbumin, Urine: 6.1 ug/mL

## 2020-01-28 ENCOUNTER — Other Ambulatory Visit: Payer: Self-pay

## 2020-01-28 ENCOUNTER — Ambulatory Visit (INDEPENDENT_AMBULATORY_CARE_PROVIDER_SITE_OTHER): Payer: Medicare Other | Admitting: *Deleted

## 2020-01-28 DIAGNOSIS — I480 Paroxysmal atrial fibrillation: Secondary | ICD-10-CM | POA: Diagnosis not present

## 2020-01-28 DIAGNOSIS — Z5181 Encounter for therapeutic drug level monitoring: Secondary | ICD-10-CM | POA: Diagnosis not present

## 2020-01-28 LAB — CMP14+EGFR
ALT: 16 IU/L (ref 0–32)
AST: 19 IU/L (ref 0–40)
Albumin/Globulin Ratio: 1.4 (ref 1.2–2.2)
Albumin: 4 g/dL (ref 3.7–4.7)
Alkaline Phosphatase: 96 IU/L (ref 48–121)
BUN/Creatinine Ratio: 18 (ref 12–28)
BUN: 14 mg/dL (ref 8–27)
Bilirubin Total: <0.2 mg/dL (ref 0.0–1.2)
CO2: 22 mmol/L (ref 20–29)
Calcium: 9.2 mg/dL (ref 8.7–10.3)
Chloride: 99 mmol/L (ref 96–106)
Creatinine, Ser: 0.79 mg/dL (ref 0.57–1.00)
GFR calc Af Amer: 86 mL/min/{1.73_m2} (ref >59)
GFR calc non Af Amer: 74 mL/min/{1.73_m2} (ref >59)
Globulin, Total: 2.9 g/dL (ref 1.5–4.5)
Glucose: 121 mg/dL — ABNORMAL HIGH (ref 65–99)
Potassium: 3.6 mmol/L (ref 3.5–5.2)
Sodium: 137 mmol/L (ref 134–144)
Total Protein: 6.9 g/dL (ref 6.0–8.5)

## 2020-01-28 LAB — LIPID PANEL
Chol/HDL Ratio: 5.4 ratio — ABNORMAL HIGH (ref 0.0–4.4)
Cholesterol, Total: 182 mg/dL (ref 100–199)
HDL: 34 mg/dL — ABNORMAL LOW (ref >39)
LDL Chol Calc (NIH): 114 mg/dL — ABNORMAL HIGH (ref 0–99)
Triglycerides: 193 mg/dL — ABNORMAL HIGH (ref 0–149)
VLDL Cholesterol Cal: 34 mg/dL (ref 5–40)

## 2020-01-28 LAB — VARICELLA ZOSTER ANTIBODY, IGG: Varicella zoster IgG: 779 index (ref >165)

## 2020-01-28 LAB — POCT INR: INR: 2 (ref 2.0–3.0)

## 2020-01-28 NOTE — Patient Instructions (Signed)
Increase dose to 1 tablet daily except 1/2 tablet on Mondays, Wednesdays and Fridays Recheck in 2 weeks

## 2020-02-01 ENCOUNTER — Other Ambulatory Visit: Payer: Self-pay | Admitting: Family Medicine

## 2020-02-13 ENCOUNTER — Ambulatory Visit (INDEPENDENT_AMBULATORY_CARE_PROVIDER_SITE_OTHER): Payer: Medicare Other | Admitting: Pharmacist

## 2020-02-13 ENCOUNTER — Other Ambulatory Visit: Payer: Self-pay | Admitting: Pharmacist

## 2020-02-13 ENCOUNTER — Other Ambulatory Visit: Payer: Self-pay

## 2020-02-13 DIAGNOSIS — I480 Paroxysmal atrial fibrillation: Secondary | ICD-10-CM

## 2020-02-13 DIAGNOSIS — Z5181 Encounter for therapeutic drug level monitoring: Secondary | ICD-10-CM | POA: Diagnosis not present

## 2020-02-13 LAB — POCT INR: INR: 1.9 — AB (ref 2.0–3.0)

## 2020-02-13 MED ORDER — WARFARIN SODIUM 5 MG PO TABS: 5.0000 mg | ORAL_TABLET | Freq: Every day | ORAL | 1 refills | Status: DC

## 2020-02-13 NOTE — Patient Instructions (Addendum)
Description   Take 1 whole tablet today (5 mg) and then continue regular schedule of 1 tablet daily except 1/2 tablet on Mondays, Wednesdays and Fridays Recheck in 3 weeks

## 2020-03-05 ENCOUNTER — Encounter: Payer: Self-pay | Admitting: Family Medicine

## 2020-03-05 ENCOUNTER — Ambulatory Visit (INDEPENDENT_AMBULATORY_CARE_PROVIDER_SITE_OTHER): Payer: Medicare Other | Admitting: Family Medicine

## 2020-03-05 ENCOUNTER — Other Ambulatory Visit: Payer: Self-pay | Admitting: Family Medicine

## 2020-03-05 DIAGNOSIS — R05 Cough: Secondary | ICD-10-CM

## 2020-03-05 DIAGNOSIS — R053 Chronic cough: Secondary | ICD-10-CM

## 2020-03-05 MED ORDER — AMITRIPTYLINE HCL 25 MG PO TABS: 25.0000 mg | ORAL_TABLET | Freq: Every day | ORAL | 1 refills | Status: DC

## 2020-03-05 NOTE — Progress Notes (Signed)
   Virtual Visit via telephone Note  I connected with Desiree Bowers on 03/05/20 at 1636 by telephone and verified that I am speaking with the correct person using two identifiers. Desiree Bowers is currently located at home and patient are currently with her during visit. The provider, Fransisca Kaufmann Kimyatta Lecy, MD is located in their office at time of visit.  Call ended at 1648  I discussed the limitations, risks, security and privacy concerns of performing an evaluation and management service by telephone and the availability of in person appointments. I also discussed with the patient that there may be a patient responsible charge related to this service. The patient expressed understanding and agreed to proceed.   History and Present Illness: Patient is having a cough and has been having it for a long time.  Coughs both day and night.  She has been fighting this for over a year and has tried many different agents.  She will get stinging in throat and then starts.  Saw ENT a few years ago for this. She takes breo for this. She has a dry cough. Sometimes has postussive emesis. She saw a pulmonary doctor in the past as well.   1. Chronic cough       Review of Systems  Constitutional: Negative for chills and fever.  HENT: Negative for congestion, ear discharge and ear pain.   Eyes: Negative for visual disturbance.  Respiratory: Positive for cough. Negative for chest tightness, shortness of breath and wheezing.   Cardiovascular: Negative for chest pain and leg swelling.  Genitourinary: Negative for difficulty urinating and dysuria.  Musculoskeletal: Negative for back pain and gait problem.  Skin: Negative for rash.  Neurological: Negative for light-headedness and headaches.  Psychiatric/Behavioral: Negative for agitation and behavioral problems.  All other systems reviewed and are negative.   Observations/Objective: Patient has comfortable and in no acute distress but definitely still has a  cough that sounds dry and nonproductive.  Assessment and Plan: Problem List Items Addressed This Visit    None    Visit Diagnoses    Chronic cough    -  Primary   Relevant Orders   Ambulatory referral to Gastroenterology      Try amitriptyline and refer to gastroenterology. Follow up plan: Return in about 4 weeks (around 04/02/2020), or if symptoms worsen or fail to improve, for f/u with pcp on chronic cough..     I discussed the assessment and treatment plan with the patient. The patient was provided an opportunity to ask questions and all were answered. The patient agreed with the plan and demonstrated an understanding of the instructions.   The patient was advised to call back or seek an in-person evaluation if the symptoms worsen or if the condition fails to improve as anticipated.  The above assessment and management plan was discussed with the patient. The patient verbalized understanding of and has agreed to the management plan. Patient is aware to call the clinic if symptoms persist or worsen. Patient is aware when to return to the clinic for a follow-up visit. Patient educated on when it is appropriate to go to the emergency department.    I provided 12 minutes of non-face-to-face time during this encounter.    Worthy Rancher, MD

## 2020-03-07 ENCOUNTER — Other Ambulatory Visit: Payer: Self-pay | Admitting: *Deleted

## 2020-03-07 ENCOUNTER — Ambulatory Visit (INDEPENDENT_AMBULATORY_CARE_PROVIDER_SITE_OTHER): Payer: Medicare Other | Admitting: *Deleted

## 2020-03-07 DIAGNOSIS — I480 Paroxysmal atrial fibrillation: Secondary | ICD-10-CM

## 2020-03-07 DIAGNOSIS — Z5181 Encounter for therapeutic drug level monitoring: Secondary | ICD-10-CM | POA: Diagnosis not present

## 2020-03-07 LAB — POCT INR: INR: 2.7 (ref 2.0–3.0)

## 2020-03-07 NOTE — Patient Instructions (Signed)
Continue warfarin 1 tablet daily except 1/2 tablet on Mondays, Wednesdays and Fridays Recheck in 4 weeks 

## 2020-03-20 ENCOUNTER — Ambulatory Visit (INDEPENDENT_AMBULATORY_CARE_PROVIDER_SITE_OTHER): Payer: Medicare Other | Admitting: Family Medicine

## 2020-03-20 ENCOUNTER — Encounter: Payer: Self-pay | Admitting: Family Medicine

## 2020-03-20 DIAGNOSIS — R053 Chronic cough: Secondary | ICD-10-CM | POA: Diagnosis not present

## 2020-03-20 DIAGNOSIS — G2581 Restless legs syndrome: Secondary | ICD-10-CM

## 2020-03-20 MED ORDER — ROPINIROLE HCL 0.5 MG PO TABS: ORAL_TABLET | ORAL | 1 refills | Status: DC

## 2020-03-20 NOTE — Progress Notes (Signed)
Virtual Visit via Telephone Note  I connected with Desiree Bowers on 03/20/20 at 10:51 AM by telephone and verified that I am speaking with the correct person using two identifiers. Desiree Bowers is currently located at home and nobody is currently with her during this visit. The provider, Loman Brooklyn, FNP is located in their office at time of visit.  I discussed the limitations, risks, security and privacy concerns of performing an evaluation and management service by telephone and the availability of in person appointments. I also discussed with the patient that there may be a patient responsible charge related to this service. The patient expressed understanding and agreed to proceed.  Subjective: PCP: Loman Brooklyn, FNP  Chief Complaint  Patient presents with  . Cough   Patient is following up on cough. She was started on amitriptyline which she reports has resolved the spasms she has been having down in her neck, but that she is still having them in her tonsils and back of her throat. She is pleased that she is coughing less, but is still coughing. She has not heard from anyone regarding the referral that was placed to GI.   Patient also reports her restless legs got worse again and she increased her Mirapex to three tablets three times a day to control it.    ROS: Per HPI  Current Outpatient Medications:  .  amitriptyline (ELAVIL) 25 MG tablet, Take 1 tablet (25 mg total) by mouth at bedtime., Disp: 30 tablet, Rfl: 1 .  atorvastatin (LIPITOR) 10 MG tablet, Take 1 tablet (10 mg total) by mouth every evening., Disp: 90 tablet, Rfl: 1 .  calcium carbonate (OSCAL) 1500 (600 Ca) MG TABS tablet, Take 2 tablets by mouth daily. , Disp: , Rfl:  .  cetirizine (ZYRTEC) 10 MG tablet, Take 1 tablet (10 mg total) by mouth daily., Disp: 90 tablet, Rfl: 1 .  cyclobenzaprine (FLEXERIL) 10 MG tablet, Take 10 mg by mouth 3 (three) times daily as needed for muscle spasms., Disp: , Rfl:  .   diclofenac Sodium (VOLTAREN) 1 % GEL, Apply 2 g topically 4 (four) times daily., Disp: 350 g, Rfl: 2 .  famotidine (PEPCID) 20 MG tablet, Take 20 mg by mouth daily., Disp: , Rfl:  .  fluticasone furoate-vilanterol (BREO ELLIPTA) 200-25 MCG/INH AEPB, Inhale 1 puff into the lungs daily., Disp: 1 each, Rfl: 11 .  furosemide (LASIX) 20 MG tablet, Take 20 mg by mouth daily as needed for edema. , Disp: , Rfl:  .  losartan-hydrochlorothiazide (HYZAAR) 100-25 MG tablet, TAKE 1 TABLET EVERY DAY, Disp: 90 tablet, Rfl: 1 .  magnesium oxide (MAG-OX) 400 (241.3 Mg) MG tablet, Take 400 mg by mouth daily. , Disp: , Rfl:  .  methocarbamol (ROBAXIN) 500 MG tablet, Take 1-2 tablets (500-1,000 mg total) by mouth every 8 (eight) hours as needed for muscle spasms., Disp: 30 tablet, Rfl: 2 .  metoprolol tartrate (LOPRESSOR) 25 MG tablet, Take 1 tablet (25 mg total) by mouth 2 (two) times daily., Disp: 180 tablet, Rfl: 2 .  oxybutynin (DITROPAN-XL) 10 MG 24 hr tablet, TAKE 1 TABLET EVERY DAY, Disp: 90 tablet, Rfl: 1 .  pramipexole (MIRAPEX) 0.25 MG tablet, Take 1 tablet (0.25 mg total) by mouth 2 (two) times daily., Disp: 180 tablet, Rfl: 1 .  sertraline (ZOLOFT) 25 MG tablet, TAKE 1 AND 1/2 TABLETS EVERY DAY, Disp: 135 tablet, Rfl: 1 .  SYNTHROID 112 MCG tablet, Take 112 mcg by mouth daily  before breakfast. , Disp: , Rfl:  .  triamcinolone cream (KENALOG) 0.1 %, Apply 1 application topically daily as needed., Disp: , Rfl:  .  warfarin (COUMADIN) 5 MG tablet, Take 1 tablet (5 mg total) by mouth daily., Disp: 30 tablet, Rfl: 1  Allergies  Allergen Reactions  . Mirapex [Pramipexole Dihydrochloride] Itching and Swelling  . Penicillins Rash  . Gabapentin Other (See Comments)    Couldn't move legs during the night after taking.  . Levothyroxine Itching  . Symbicort [Budesonide-Formoterol Fumarate] Itching   Past Medical History:  Diagnosis Date  . Cancer [facility])    breast   . Coronary artery disease due to lipid rich  plaque 04/13/2016  . Diabetes mellitus without complication ([place])   . Diverticulosis of intestine without bleeding 12/13/2016  . History of right mastectomy 11/15/2014  . Hypothyroidism   . Overactive bladder   . Paget's disease of the bone   . Paroxysmal atrial fibrillation ([place]) 06/19/2014  . Restless leg syndrome     Observations/Objective: A&O  No respiratory distress or wheezing audible over the phone Mood, judgement, and thought processes all WNL   Assessment and Plan: 1. Chronic cough - Continue current regimen. We are checking on referral to GI for her since she has not heard anything.  2. Restless leg syndrome - Mirapex D/C'd due to patient needing too high of a dosage. Rx'd Requip.  - rOPINIRole (REQUIP) 0.5 MG tablet; Take 0.25 mg (half tab) by mouth once daily x2 days, then increase to 0.5 mg if needed.  Dispense: 30 tablet; Refill: 1   Follow Up Instructions: Return in about 2 weeks (around 04/03/2020) for RLS.  I discussed the assessment and treatment plan with the patient. The patient was provided an opportunity to ask questions and all were answered. The patient agreed with the plan and demonstrated an understanding of the instructions.   The patient was advised to call back or seek an in-person evaluation if the symptoms worsen or if the condition fails to improve as anticipated.  The above assessment and management plan was discussed with the patient. The patient verbalized understanding of and has agreed to the management plan. Patient is aware to call the clinic if symptoms persist or worsen. Patient is aware when to return to the clinic for a follow-up visit. Patient educated on when it is appropriate to go to the emergency department.   Time call ended: 11:10 AM  I provided 21 minutes of non-face-to-face time during this encounter.  Hendricks Limes, MSN, APRN, FNP-C [facility] 03/20/20

## 2020-03-24 ENCOUNTER — Telehealth: Payer: Self-pay | Admitting: Family Medicine

## 2020-04-03 ENCOUNTER — Ambulatory Visit (INDEPENDENT_AMBULATORY_CARE_PROVIDER_SITE_OTHER): Payer: Medicare Other | Admitting: Cardiology

## 2020-04-03 ENCOUNTER — Ambulatory Visit (INDEPENDENT_AMBULATORY_CARE_PROVIDER_SITE_OTHER): Payer: Medicare Other | Admitting: *Deleted

## 2020-04-03 ENCOUNTER — Encounter: Payer: Self-pay | Admitting: Nurse Practitioner

## 2020-04-03 ENCOUNTER — Encounter: Payer: Self-pay | Admitting: Cardiology

## 2020-04-03 ENCOUNTER — Other Ambulatory Visit: Payer: Self-pay | Admitting: Cardiology

## 2020-04-03 ENCOUNTER — Ambulatory Visit (INDEPENDENT_AMBULATORY_CARE_PROVIDER_SITE_OTHER): Payer: Medicare Other | Admitting: Nurse Practitioner

## 2020-04-03 ENCOUNTER — Other Ambulatory Visit: Payer: Self-pay

## 2020-04-03 VITALS — BP 140/68 | HR 73 | Ht 65.0 in | Wt 183.6 lb

## 2020-04-03 VITALS — BP 152/69 | HR 80 | Temp 97.8°F | Ht 65.0 in | Wt 182.8 lb

## 2020-04-03 DIAGNOSIS — I480 Paroxysmal atrial fibrillation: Secondary | ICD-10-CM

## 2020-04-03 DIAGNOSIS — G2581 Restless legs syndrome: Secondary | ICD-10-CM | POA: Diagnosis not present

## 2020-04-03 DIAGNOSIS — I10 Essential (primary) hypertension: Secondary | ICD-10-CM

## 2020-04-03 DIAGNOSIS — K219 Gastro-esophageal reflux disease without esophagitis: Secondary | ICD-10-CM

## 2020-04-03 DIAGNOSIS — Z5181 Encounter for therapeutic drug level monitoring: Secondary | ICD-10-CM

## 2020-04-03 LAB — POCT INR: INR: 3.4 — AB (ref 2.0–3.0)

## 2020-04-03 MED ORDER — ROPINIROLE HCL 0.5 MG PO TABS: ORAL_TABLET | ORAL | 1 refills | Status: DC

## 2020-04-03 MED ORDER — METOPROLOL TARTRATE 25 MG PO TABS: 37.5000 mg | ORAL_TABLET | Freq: Two times a day (BID) | ORAL | 1 refills | Status: DC

## 2020-04-03 MED ORDER — ESOMEPRAZOLE MAGNESIUM 20 MG PO CPDR: 20.0000 mg | DELAYED_RELEASE_CAPSULE | Freq: Every day | ORAL | 1 refills | Status: DC

## 2020-04-03 NOTE — Patient Instructions (Signed)
Restless Legs Syndrome Restless legs syndrome is a condition that causes uncomfortable feelings or sensations in the legs, especially while sitting or lying down. The sensations usually cause an overwhelming urge to move the legs. The arms can also sometimes be affected. The condition can range from mild to severe. The symptoms often interfere with a person's ability to sleep. What are the causes? The cause of this condition is not known. What increases the risk? The following factors may make you more likely to develop this condition:  Being older than 50.  Pregnancy.  Being a woman. In general, the condition is more common in women than in men.  A family history of the condition.  Having iron deficiency.  Overuse of caffeine, nicotine, or alcohol.  Certain medical conditions, such as kidney disease, Parkinson's disease, or nerve damage.  Certain medicines, such as those for high blood pressure, nausea, colds, allergies, depression, and some heart conditions. What are the signs or symptoms? The main symptom of this condition is uncomfortable sensations in the legs, such as:  Pulling.  Tingling.  Prickling.  Throbbing.  Crawling.  Burning. Usually, the sensations:  Affect both sides of the body.  Are worse when you sit or lie down.  Are worse at night. These may wake you up or make it difficult to fall asleep.  Make you have a strong urge to move your legs.  Are temporarily relieved by moving your legs. The arms can also be affected, but this is rare. People who have this condition often have tiredness during the day because of their lack of sleep at night. How is this diagnosed? This condition may be diagnosed based on:  Your symptoms.  Blood tests. In some cases, you may be monitored in a sleep lab by a specialist (a sleep study). This can detect any disruptions in your sleep. How is this treated? This condition is treated by managing the symptoms. This may  include:  Lifestyle changes, such as exercising, using relaxation techniques, and avoiding caffeine, alcohol, or tobacco.  Medicines. Anti-seizure medicines may be tried first. Follow these instructions at home:     General instructions  Take over-the-counter and prescription medicines only as told by your health care provider.  Use methods to help relieve the uncomfortable sensations, such as: ? Massaging your legs. ? Walking or stretching. ? Taking a cold or hot bath.  Keep all follow-up visits as told by your health care provider. This is important. Lifestyle  Practice good sleep habits. For example, go to bed and get up at the same time every day. Most adults should get 7-9 hours of sleep each night.  Exercise regularly. Try to get at least 30 minutes of exercise most days of the week.  Practice ways of relaxing, such as yoga or meditation.  Avoid caffeine and alcohol.  Do not use any products that contain nicotine or tobacco, such as cigarettes and e-cigarettes. If you need help quitting, ask your health care provider. Contact a health care provider if:  Your symptoms get worse or they do not improve with treatment. Summary  Restless legs syndrome is a condition that causes uncomfortable feelings or sensations in the legs, especially while sitting or lying down.  The symptoms often interfere with a person's ability to sleep.  This condition is treated by managing the symptoms. You may need to make lifestyle changes or take medicines. This information is not intended to replace advice given to you by your health care provider. Make sure   you discuss any questions you have with your health care provider. Document Revised: 06/13/2017 Document Reviewed: 06/13/2017 Elsevier Patient Education  [place]. Gastroesophageal Reflux Disease, Adult Gastroesophageal reflux (GER) happens when acid from the stomach flows up into the tube that connects the mouth and the  stomach (esophagus). Normally, food travels down the esophagus and stays in the stomach to be digested. With GER, food and stomach acid sometimes move back up into the esophagus. You may have a disease called gastroesophageal reflux disease (GERD) if the reflux:  Happens often.  Causes frequent or very bad symptoms.  Causes problems such as damage to the esophagus. When this happens, the esophagus becomes sore and swollen (inflamed). Over time, GERD can make small holes (ulcers) in the lining of the esophagus. What are the causes? This condition is caused by a problem with the muscle between the esophagus and the stomach. When this muscle is weak or not normal, it does not close properly to keep food and acid from coming back up from the stomach. The muscle can be weak because of:  Tobacco use.  Pregnancy.  Having a certain type of hernia (hiatal hernia).  Alcohol use.  Certain foods and drinks, such as coffee, chocolate, onions, and peppermint. What increases the risk? You are more likely to develop this condition if you:  Are overweight.  Have a disease that affects your connective tissue.  Use NSAID medicines. What are the signs or symptoms? Symptoms of this condition include:  Heartburn.  Difficult or painful swallowing.  The feeling of having a lump in the throat.  A bitter taste in the mouth.  Bad breath.  Having a lot of saliva.  Having an upset or bloated stomach.  Belching.  Chest pain. Different conditions can cause chest pain. Make sure you see your doctor if you have chest pain.  Shortness of breath or noisy breathing (wheezing).  Ongoing (chronic) cough or a cough at night.  Wearing away of the surface of teeth (tooth enamel).  Weight loss. How is this treated? Treatment will depend on how bad your symptoms are. Your doctor may suggest:  Changes to your diet.  Medicine.  Surgery. Follow these instructions at home: Eating and  drinking   Follow a diet as told by your doctor. You may need to avoid foods and drinks such as: ? Coffee and tea (with or without caffeine). ? Drinks that contain alcohol. ? Energy drinks and sports drinks. ? Bubbly (carbonated) drinks or sodas. ? Chocolate and cocoa. ? Peppermint and mint flavorings. ? Garlic and onions. ? Horseradish. ? Spicy and acidic foods. These include peppers, chili powder, curry powder, vinegar, hot sauces, and BBQ sauce. ? Citrus fruit juices and citrus fruits, such as oranges, lemons, and limes. ? Tomato-based foods. These include red sauce, chili, salsa, and pizza with red sauce. ? Fried and fatty foods. These include donuts, french fries, potato chips, and high-fat dressings. ? High-fat meats. These include hot dogs, rib eye steak, sausage, ham, and bacon. ? High-fat dairy items, such as whole milk, butter, and cream cheese.  Eat small meals often. Avoid eating large meals.  Avoid drinking large amounts of liquid with your meals.  Avoid eating meals during the 2-3 hours before bedtime.  Avoid lying down right after you eat.  Do not exercise right after you eat. Lifestyle   Do not use any products that contain nicotine or tobacco. These include cigarettes, e-cigarettes, and chewing tobacco. If you need help quitting, ask  your doctor.  Try to lower your stress. If you need help doing this, ask your doctor.  If you are overweight, lose an amount of weight that is healthy for you. Ask your doctor about a safe weight loss goal. General instructions  Pay attention to any changes in your symptoms.  Take over-the-counter and prescription medicines only as told by your doctor. Do not take aspirin, ibuprofen, or other NSAIDs unless your doctor says it is okay.  Wear loose clothes. Do not wear anything tight around your waist.  Raise (elevate) the head of your bed about 6 inches (15 cm).  Avoid bending over if this makes your symptoms worse.  Keep  all follow-up visits as told by your doctor. This is important. Contact a doctor if:  You have new symptoms.  You lose weight and you do not know why.  You have trouble swallowing or it hurts to swallow.  You have wheezing or a cough that keeps happening.  Your symptoms do not get better with treatment.  You have a hoarse voice. Get help right away if:  You have pain in your arms, neck, jaw, teeth, or back.  You feel sweaty, dizzy, or light-headed.  You have chest pain or shortness of breath.  You throw up (vomit) and your throw-up looks like blood or coffee grounds.  You pass out (faint).  Your poop (stool) is bloody or black.  You cannot swallow, drink, or eat. Summary  If a person has gastroesophageal reflux disease (GERD), food and stomach acid move back up into the esophagus and cause symptoms or problems such as damage to the esophagus.  Treatment will depend on how bad your symptoms are.  Follow a diet as told by your doctor.  Take all medicines only as told by your doctor. This information is not intended to replace advice given to you by your health care provider. Make sure you discuss any questions you have with your health care provider. Document Revised: 11/30/2017 Document Reviewed: 11/30/2017 Elsevier Patient Education  [place].

## 2020-04-03 NOTE — Progress Notes (Signed)
Clinical Summary Desiree Bowers is a 74 y.o.female seen today for follow up of the following medical problems.      1. PAF -08/2019 monitorshowed episodes of PAF  - occasional palpitations, compliant with lopressor - no bleeding on coumadin   2. Vision changes - seen in ER 08/29/19 with acute vision change - MRI without acute findings - seen by optho, diagnosed with retinal ischemia.  - optho has asked for echo and carotid dopplers.  - may have been related to her PAF which just diagnosed by monitor.   3. 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   4. HTN - currently on prednisone - compliant with meds   Has had covid moderna x 2 shots.   Past Medical History:  Diagnosis Date  . Cancer Desiree Bowers)    breast   . Coronary artery Bowers due to lipid rich plaque 04/13/2016  . Diabetes mellitus without complication (Desiree Bowers)   . Diverticulosis of intestine without bleeding 12/13/2016  . History of right mastectomy 11/15/2014  . Hypothyroidism   . Overactive bladder   . Paget's Bowers of the bone   . Paroxysmal atrial fibrillation (Desiree Bowers) 06/19/2014  . Restless leg Bowers      Allergies  Allergen Reactions  . Mirapex [Pramipexole Dihydrochloride] Itching and Swelling  . Penicillins Rash  . Gabapentin Other (See Comments)    Couldn't move legs during the night after taking.  . Levothyroxine Itching  . Symbicort [Budesonide-Formoterol Fumarate] Itching     Current Outpatient Medications  Medication Sig Dispense Refill  . amitriptyline (ELAVIL) 25 MG tablet Take 1 tablet (25 mg total) by mouth at bedtime. 30 tablet 1  . atorvastatin (LIPITOR) 10 MG tablet Take 1 tablet (10 mg total) by mouth every evening. 90 tablet 1  . calcium carbonate (OSCAL) 1500 (600 Ca) MG TABS tablet Take 2 tablets by mouth daily.     . cyclobenzaprine (FLEXERIL) 10 MG tablet Take 10 mg by mouth 3 (three) times daily as needed for muscle spasms.     . diclofenac Sodium (VOLTAREN) 1 % GEL Apply 2 g topically 4 (four) times daily. 350 g 2  . famotidine (PEPCID) 20 MG tablet Take 20 mg by mouth daily.    . fluticasone furoate-vilanterol (BREO ELLIPTA) 200-25 MCG/INH AEPB Inhale 1 puff into the lungs daily. 1 each 11  . furosemide (LASIX) 20 MG tablet Take 20 mg by mouth daily as needed for edema.     Marland Kitchen loratadine (CLARITIN) 10 MG tablet Take 10 mg by mouth daily.    Marland Kitchen losartan-hydrochlorothiazide (HYZAAR) 100-25 MG tablet TAKE 1 TABLET EVERY DAY 90 tablet 1  . magnesium oxide (MAG-OX) 400 (241.3 Mg) MG tablet Take 400 mg by mouth daily.     . methocarbamol (ROBAXIN) 500 MG tablet Take 1-2 tablets (500-1,000 mg total) by mouth every 8 (eight) hours as needed for muscle spasms. 30 tablet 2  . metoprolol tartrate (LOPRESSOR) 25 MG tablet Take 1 tablet (25 mg total) by mouth 2 (two) times daily. 180 tablet 2  . oxybutynin (DITROPAN-XL) 10 MG 24 hr tablet TAKE 1 TABLET EVERY DAY 90 tablet 1  . rOPINIRole (REQUIP) 0.5 MG tablet Take 0.25 mg (half tab) by mouth once daily x2 days, then increase to 0.5 mg if needed. 30 tablet 1  . sertraline (ZOLOFT) 25 MG tablet TAKE 1 AND 1/2 TABLETS EVERY DAY 135 tablet 1  . SYNTHROID 112 MCG  tablet Take 112 mcg by mouth daily before breakfast.     . triamcinolone cream (KENALOG) 0.1 % Apply 1 application topically daily as needed.    . warfarin (COUMADIN) 5 MG tablet Take 1 tablet (5 mg total) by mouth daily. 30 tablet 1   No current facility-administered medications for this visit.     Past Surgical History:  Procedure Laterality Date  . ABDOMINAL HYSTERECTOMY  1999  . APPENDECTOMY     as a child  . CATARACT EXTRACTION Bilateral Fall 2013   Dr. Charise Bowers  . CATARACT EXTRACTION, BILATERAL    . EYE SURGERY Bilateral Fall 2013   Cat Sx - Dr. Charise Bowers  . MASTECTOMY Right      Allergies  Allergen Reactions  . Mirapex [Pramipexole Dihydrochloride] Itching and Swelling  . Penicillins Rash  . Gabapentin Other  (See Comments)    Couldn't move legs during the night after taking.  . Levothyroxine Itching  . Symbicort [Budesonide-Formoterol Fumarate] Itching      Family History  Problem Relation Age of Onset  . Breast cancer Mother   . Colon cancer Father   . Kidney cancer Father   . Bladder Cancer Sister   . Suicidality Brother   . Heart attack Maternal Grandmother   . Breast cancer Sister   . Stomach cancer Sister   . Ovarian cancer Sister   . Breast cancer Sister   . Breast cancer Sister   . Diabetes Sister   . Other Brother        parathyroid disorder; Desiree Bowers  . Desiree Bowers Son   . Desiree Bowers Daughter      Social History [name] reports that she has quit smoking. She has never used smokeless tobacco. [name] reports no history of alcohol use.   Review of Systems CONSTITUTIONAL: No weight loss, fever, chills, weakness or fatigue.  HEENT: Eyes: No visual loss, blurred vision, double vision or yellow sclerae.No hearing loss, sneezing, congestion, runny nose or sore throat.  SKIN: No rash or itching.  CARDIOVASCULAR: per hpi RESPIRATORY: No shortness of breath, cough or sputum.  GASTROINTESTINAL: No anorexia, nausea, vomiting or diarrhea. No abdominal pain or blood.  GENITOURINARY: No burning on urination, no polyuria NEUROLOGICAL: No headache, dizziness, syncope, paralysis, ataxia, numbness or tingling in the extremities. No change in bowel or bladder control.  MUSCULOSKELETAL: No muscle, back pain, joint pain or stiffness.  LYMPHATICS: No enlarged nodes. No history of splenectomy.  PSYCHIATRIC: No history of depression or anxiety.  ENDOCRINOLOGIC: No reports of sweating, cold or heat intolerance. No polyuria or polydipsia.  Marland Kitchen   Physical Examination Vitals:   04/03/20 1030  BP: 140/68  Pulse: 73  SpO2: 94%   Filed Weights   04/03/20 1030  Weight: 183 lb 9.6 oz (83.3 kg)    Gen: resting comfortably, no acute distress HEENT: no scleral  icterus, pupils equal round and reactive, no palptable cervical adenopathy,  CV: RRR, no m/r/g, no jvd Resp: Clear to auscultation bilaterally GI: abdomen is soft, non-tender, non-distended, normal bowel sounds, no hepatosplenomegaly MSK: extremities are warm, no edema.  Skin: warm, no rash Neuro:  no focal deficits Psych: appropriate affect   Diagnostic Studies  08/2015 cath [facility] FINDINGS:  Coronary Angiography 1. Left Main - Normal 2. Left anterior descending artery - the LAD gives off a small first diagonal and a large second diagonal, normal. 3. Diagonals - Normal 4. Left Circumflex - the circumflex gives off a large OM1, small OM 2, and  moderately large OM 3, normal. 5. Obtuse Marginals - Normal 6. Right Coronary Artery - the RCA gives off the PDA and small PLV branches, normal.There is a conus Ebenezer Mccaskey that originates on the aorta, dual lumen with the RCA. 7. Posterior Descending Artery - Normal  Additional comments on angiography: Right Dominance  1.Normal coronary arteries. 2.LVEDP 20 3.RRA access.There was spasm of the radial artery near the level of the elbow so access was changed from [place] to right femoral artery.RFA was closed with Perclose vascular suture device. 4. No Complications.   RECOMMENDATIONS:  1. Continue workup for etiology of current symptoms. 2. Followup with Dr. Hamilton Capri for with her PCP. 3. Continue risk factor modification  10/2019 echo IMPRESSIONS    1. Left ventricular ejection fraction, by estimation, is 55 to 60%. The  left ventricle has normal function. The left ventricle has no regional  wall motion abnormalities. There is mild left ventricular hypertrophy of  the basal-septal segment. Left  ventricular diastolic parameters are consistent with Grade I diastolic  dysfunction (impaired relaxation).  2. Right ventricular systolic function is low normal. The right  ventricular size is normal. There is normal pulmonary artery  systolic  pressure.  3. The mitral valve is grossly normal. Mild to moderate mitral valve  regurgitation.  4. Tricuspid valve regurgitation is moderate.  5. The aortic valve is tricuspid. Aortic valve regurgitation is trivial.  No aortic stenosis is present.  6. The inferior vena cava is normal in size with greater than 50%  respiratory variability, suggesting right atrial pressure of 3 mmHg.    Assessment and Plan  1.PAF - confirmed diagnosis by recent monitor, perhaps the etiology of her recent retinal ischemia - eliquis and xarelto were too expensive, she is on coumadin - some recent palpitations, we will increase lopressor to 37.5mg  bid   2. Vision change - seen by optho, signs of retinal ischemia. Perhaps related to newly diagnosed PAF  3. Valvular heart Bowers - mild to mod MR and mod TR Likely repeat echo 1-2 years  4. HTN - elevated today but on prednisone, same pattern as last visit - increasing lopressor for her palpitations, continue to monitor bp's.   F/Bowers 6 months   Arnoldo Lenis, M.D.

## 2020-04-03 NOTE — Patient Instructions (Signed)
Your physician wants you to follow-up in: [place] will receive a reminder letter in the mail two months in advance. If you don't receive a letter, please call our office to schedule the follow-up appointment.  Your physician has recommended you make the following change in your medication:   INCREASE METOPROLOL 37.5 MG (1 AND 1/2 TABLETS) TWICE DAILY   Thank you for choosing [facility]!!

## 2020-04-03 NOTE — Assessment & Plan Note (Signed)
Patient is well managed on current medication.  Changes made from current medication to Nexium due to insurance not covering current medication. Continue to maintain a healthy diet and exercise regimen. Refill sent to pharmacy Education provided with printed handouts given.

## 2020-04-03 NOTE — Assessment & Plan Note (Signed)
Restless leg syndrome well controlled on Requip 0.5 mg tablet daily.  Provided education to patient with printed handouts given.  Rx refill sent to pharmacy. Follow-up with worsening or uncontrolled symptoms.

## 2020-04-03 NOTE — Progress Notes (Signed)
Established Patient Office Visit  Subjective:  Patient ID: Desiree Bowers, female    DOB: 07/24/1945  Age: 74 y.o. MRN: [id number]  CC:  Chief Complaint  Patient presents with  . RLS    2 week follow up    HPI [name] presents for follow-up: restless leg syndrome.  Patient is reporting therapeutic effect from current medication.. ReQuip 0.5 mg tablet daily.  Compliance with medication has been good including taking medication as prescribed and following up as directed.  GERD, Follow up:  The patient was last seen for GERD 6 years ago. Changes made since that visit include none.  She reports excellent compliance with treatment. She is not having side effects.. She is NOT experiencing belching, chest pain, choking on food, cough or deep pressure at base of neck  -----------------------------------------------------------------------------------------      Past Medical History:  Diagnosis Date  . Cancer [facility])    breast   . Coronary artery disease due to lipid rich plaque 04/13/2016  . Diabetes mellitus without complication ([place])   . Diverticulosis of intestine without bleeding 12/13/2016  . History of right mastectomy 11/15/2014  . Hypothyroidism   . Overactive bladder   . Paget's disease of the bone   . Paroxysmal atrial fibrillation ([place]) 06/19/2014  . Restless leg syndrome     Past Surgical History:  Procedure Laterality Date  . ABDOMINAL HYSTERECTOMY  1999  . APPENDECTOMY     as a child  . CATARACT EXTRACTION Bilateral Fall 2013   Dr. Charise Killian  . CATARACT EXTRACTION, BILATERAL    . EYE SURGERY Bilateral Fall 2013   Cat Sx - Dr. Charise Killian  . MASTECTOMY Right     Family History  Problem Relation Age of Onset  . Breast cancer Mother   . Colon cancer Father   . Kidney cancer Father   . Bladder Cancer Sister   . Suicidality Brother   . Heart attack Maternal Grandmother   . Breast cancer Sister   . Stomach cancer Sister   . Ovarian cancer Sister   . Breast  cancer Sister   . Breast cancer Sister   . Diabetes Sister   . Other Brother        parathyroid disorder; Engelmann's disease  . Angelman syndrome Son   . Graves' disease Daughter     Social History   Socioeconomic History  . Marital status: Widowed    Spouse name: Not on file  . Number of children: 4  . Years of education: Not on file  . Highest education level: GED or equivalent  Occupational History  . Occupation: Retired  Tobacco Use  . Smoking status: Former Research scientist (life sciences)  . Smokeless tobacco: Never Used  Vaping Use  . Vaping Use: Never used  Substance and Sexual Activity  . Alcohol use: No  . Drug use: No  . Sexual activity: Not Currently  Other Topics Concern  . Not on file  Social History Narrative  . Not on file   Social Determinants of Health   Financial Resource Strain:   . Difficulty of Paying Living Expenses: Not on file  Food Insecurity:   . Worried About Charity fundraiser in the Last Year: Not on file  . Ran Out of Food in the Last Year: Not on file  Transportation Needs:   . Lack of Transportation (Medical): Not on file  . Lack of Transportation (Non-Medical): Not on file  Physical Activity:   . Days of Exercise per  Week: Not on file  . Minutes of Exercise per Session: Not on file  Stress:   . Feeling of Stress : Not on file  Social Connections:   . Frequency of Communication with Friends and Family: Not on file  . Frequency of Social Gatherings with Friends and Family: Not on file  . Attends Religious Services: Not on file  . Active Member of Clubs or Organizations: Not on file  . Attends Archivist Meetings: Not on file  . Marital Status: Not on file  Intimate Partner Violence:   . Fear of Current or Ex-Partner: Not on file  . Emotionally Abused: Not on file  . Physically Abused: Not on file  . Sexually Abused: Not on file    Outpatient Medications Prior to Visit  Medication Sig Dispense Refill  . amitriptyline (ELAVIL) 25 MG  tablet Take 1 tablet (25 mg total) by mouth at bedtime. 30 tablet 1  . atorvastatin (LIPITOR) 10 MG tablet Take 1 tablet (10 mg total) by mouth every evening. 90 tablet 1  . calcium carbonate (OSCAL) 1500 (600 Ca) MG TABS tablet Take 2 tablets by mouth daily.     . cyclobenzaprine (FLEXERIL) 10 MG tablet Take 10 mg by mouth 3 (three) times daily as needed for muscle spasms.    . diclofenac Sodium (VOLTAREN) 1 % GEL Apply 2 g topically 4 (four) times daily. 350 g 2  . famotidine (PEPCID) 20 MG tablet Take 20 mg by mouth daily.    . fluticasone furoate-vilanterol (BREO ELLIPTA) 200-25 MCG/INH AEPB Inhale 1 puff into the lungs daily. 1 each 11  . furosemide (LASIX) 20 MG tablet Take 20 mg by mouth daily as needed for edema.     Marland Kitchen loratadine (CLARITIN) 10 MG tablet Take 10 mg by mouth daily.    Marland Kitchen losartan-hydrochlorothiazide (HYZAAR) 100-25 MG tablet TAKE 1 TABLET EVERY DAY 90 tablet 1  . magnesium oxide (MAG-OX) 400 (241.3 Mg) MG tablet Take 400 mg by mouth daily.     . methocarbamol (ROBAXIN) 500 MG tablet Take 1-2 tablets (500-1,000 mg total) by mouth every 8 (eight) hours as needed for muscle spasms. 30 tablet 2  . metoprolol tartrate (LOPRESSOR) 25 MG tablet Take 1.5 tablets (37.5 mg total) by mouth 2 (two) times daily. 270 tablet 1  . oxybutynin (DITROPAN-XL) 10 MG 24 hr tablet TAKE 1 TABLET EVERY DAY 90 tablet 1  . sertraline (ZOLOFT) 25 MG tablet TAKE 1 AND 1/2 TABLETS EVERY DAY 135 tablet 1  . SYNTHROID 112 MCG tablet Take 112 mcg by mouth daily before breakfast.     . triamcinolone cream (KENALOG) 0.1 % Apply 1 application topically daily as needed.    . warfarin (COUMADIN) 5 MG tablet Take 1 tablet (5 mg total) by mouth daily. 30 tablet 1  . rOPINIRole (REQUIP) 0.5 MG tablet Take 0.25 mg (half tab) by mouth once daily x2 days, then increase to 0.5 mg if needed. 30 tablet 1   No facility-administered medications prior to visit.    Allergies  Allergen Reactions  . Mirapex [Pramipexole  Dihydrochloride] Itching and Swelling  . Penicillins Rash  . Gabapentin Other (See Comments)    Couldn't move legs during the night after taking.  . Levothyroxine Itching  . Symbicort [Budesonide-Formoterol Fumarate] Itching    ROS Review of Systems  Neurological: Negative for light-headedness and headaches.       Restless leg  All other systems reviewed and are negative.     Objective:  Physical Exam Vitals reviewed.  HENT:     Head: Normocephalic.     Nose: Nose normal.  Eyes:     Conjunctiva/sclera: Conjunctivae normal.  Cardiovascular:     Rate and Rhythm: Normal rate and regular rhythm.     Pulses: Normal pulses.     Heart sounds: Normal heart sounds.  Pulmonary:     Effort: Pulmonary effort is normal.     Breath sounds: Normal breath sounds.  Abdominal:     General: Bowel sounds are normal.  Musculoskeletal:        General: Normal range of motion.  Skin:    General: Skin is warm and dry.  Neurological:     Mental Status: She is alert and oriented to person, place, and time.  Psychiatric:        Mood and Affect: Mood normal.        Behavior: Behavior normal.     BP (!) 152/69   Pulse 80   Temp 97.8 F (36.6 C) (Temporal)   Ht 5\' 5"  (1.651 m)   Wt 182 lb 12.8 oz (82.9 kg)   SpO2 96%   BMI 30.42 kg/m  Wt Readings from Last 3 Encounters:  04/03/20 182 lb 12.8 oz (82.9 kg)  04/03/20 183 lb 9.6 oz (83.3 kg)  01/25/20 186 lb 3.2 oz (84.5 kg)     There are no preventive care reminders to display for this patient.  There are no preventive care reminders to display for this patient.  Lab Results  Component Value Date   TSH 1.390 10/25/2019   Lab Results  Component Value Date   WBC WILL FOLLOW 01/25/2020   HGB WILL FOLLOW 01/25/2020   HCT WILL FOLLOW 01/25/2020   MCV WILL FOLLOW 01/25/2020   PLT WILL FOLLOW 01/25/2020   Lab Results  Component Value Date   NA 137 01/25/2020   K 3.6 01/25/2020   CO2 22 01/25/2020   GLUCOSE 121 (H)  01/25/2020   BUN 14 01/25/2020   CREATININE 0.79 01/25/2020   BILITOT <0.2 01/25/2020   ALKPHOS 96 01/25/2020   AST 19 01/25/2020   ALT 16 01/25/2020   PROT 6.9 01/25/2020   ALBUMIN 4.0 01/25/2020   CALCIUM 9.2 01/25/2020   ANIONGAP 10 08/29/2019   Lab Results  Component Value Date   CHOL 182 01/25/2020   Lab Results  Component Value Date   HDL 34 (L) 01/25/2020   Lab Results  Component Value Date   LDLCALC 114 (H) 01/25/2020   Lab Results  Component Value Date   TRIG 193 (H) 01/25/2020   Lab Results  Component Value Date   CHOLHDL 5.4 (H) 01/25/2020   Lab Results  Component Value Date   HGBA1C 6.7 01/25/2020      Assessment & Plan:   Problem List Items Addressed This Visit      Digestive   Gastroesophageal reflux disease without esophagitis    Patient is well managed on current medication.  Changes made from current medication to Nexium due to insurance not covering current medication. Continue to maintain a healthy diet and exercise regimen. Refill sent to pharmacy Education provided with printed handouts given.      Relevant Medications   esomeprazole (NEXIUM 24HR CLEAR MINIS) 20 MG capsule     Other   Restless leg syndrome - Primary    Restless leg syndrome well controlled on Requip 0.5 mg tablet daily.  Provided education to patient with printed handouts given.  Rx refill sent to  pharmacy. Follow-up with worsening or uncontrolled symptoms.       Relevant Medications   rOPINIRole (REQUIP) 0.5 MG tablet      Meds ordered this encounter  Medications  . esomeprazole (NEXIUM 24HR CLEAR MINIS) 20 MG capsule    Sig: Take 1 capsule (20 mg total) by mouth daily at 12 noon.    Dispense:  90 capsule    Refill:  1    Order Specific Question:   Supervising Provider    Answer:   Caryl Pina A [id number]  . rOPINIRole (REQUIP) 0.5 MG tablet    Sig: Take 1 tablet twice daily as needed    Dispense:  60 tablet    Refill:  1    D/C Mirapex.     Order Specific Question:   Supervising Provider    Answer:   Caryl Pina A [[id number]]    Follow-up: Return if symptoms worsen or fail to improve.    Ivy Lynn, NP

## 2020-04-03 NOTE — Patient Instructions (Signed)
Hold warfarin tonight then continue 1 tablet daily except 1/2 tablet on Mondays, Wednesdays and Fridays Recheck in 3 weeks

## 2020-04-04 ENCOUNTER — Other Ambulatory Visit: Payer: Self-pay | Admitting: Family Medicine

## 2020-04-24 ENCOUNTER — Ambulatory Visit (INDEPENDENT_AMBULATORY_CARE_PROVIDER_SITE_OTHER): Payer: Medicare Other | Admitting: *Deleted

## 2020-04-24 DIAGNOSIS — Z5181 Encounter for therapeutic drug level monitoring: Secondary | ICD-10-CM

## 2020-04-24 DIAGNOSIS — I480 Paroxysmal atrial fibrillation: Secondary | ICD-10-CM | POA: Diagnosis not present

## 2020-04-24 LAB — POCT INR: INR: 2.3 (ref 2.0–3.0)

## 2020-04-24 MED ORDER — WARFARIN SODIUM 5 MG PO TABS: 5.0000 mg | ORAL_TABLET | Freq: Every day | ORAL | 3 refills | Status: DC

## 2020-04-24 NOTE — Patient Instructions (Signed)
Continue warfarin 1 tablet daily except 1/2 tablet on Mondays, Wednesdays and Fridays Recheck in 4 weeks 

## 2020-05-08 NOTE — Progress Notes (Signed)
[facility] Note  05/15/2020     CHIEF COMPLAINT Patient presents for Retina Follow Up   HISTORY OF PRESENT ILLNESS: Desiree Bowers is a 74 y.o. female who presents to the clinic today for:   HPI    Retina Follow Up    Patient presents with  Other.  In both eyes.  This started months ago.  Severity is moderate.  Duration of months.  Since onset it is stable.  I, the attending physician,  performed the HPI with the patient and updated documentation appropriately.          Comments    Pt states vision is the same OU.   Pt denies eye pain or discomfort and denies any new or worsening floaters or fol OU.       Last edited by [name], MD on 05/15/2020  9:23 AM. (History)    pt states she has been put on Warfarin for Afib since she was here last   Referring physician: Loman [place], [place],  [place] [id number]  HISTORICAL INFORMATION:   Selected notes from the MEDICAL RECORD NUMBER Referred by Dr. Parke Simmers for retina eval, concern for RAO OS   CURRENT MEDICATIONS: No current outpatient medications on file. (Ophthalmic Drugs)   No current facility-administered medications for this visit. (Ophthalmic Drugs)   Current Outpatient Medications (Other)  Medication Sig  . amitriptyline (ELAVIL) 25 MG tablet TAKE ONE TABLET DAILY AT BEDTIME  . atorvastatin (LIPITOR) 10 MG tablet Take 1 tablet (10 mg total) by mouth every evening.  . calcium carbonate (OSCAL) 1500 (600 Ca) MG TABS tablet Take 2 tablets by mouth daily.   . cyclobenzaprine (FLEXERIL) 10 MG tablet Take 10 mg by mouth 3 (three) times daily as needed for muscle spasms.  . diclofenac Sodium (VOLTAREN) 1 % GEL Apply 2 g topically 4 (four) times daily.  Marland Kitchen esomeprazole (NEXIUM 24HR CLEAR MINIS) 20 MG capsule Take 1 capsule (20 mg total) by mouth daily at 12 noon.  . famotidine (PEPCID) 20 MG tablet Take 1 tablet (20 mg total) by mouth daily.  . fluticasone  furoate-vilanterol (BREO ELLIPTA) 200-25 MCG/INH AEPB Inhale 1 puff into the lungs daily.  . furosemide (LASIX) 20 MG tablet Take 20 mg by mouth daily as needed for edema.   Marland Kitchen loratadine (CLARITIN) 10 MG tablet Take 10 mg by mouth daily.  Marland Kitchen losartan-hydrochlorothiazide (HYZAAR) 100-25 MG tablet TAKE 1 TABLET EVERY DAY  . magnesium oxide (MAG-OX) 400 (241.3 Mg) MG tablet Take 400 mg by mouth daily.   . meloxicam (MOBIC) 7.5 MG tablet Take 1 tablet (7.5 mg total) by mouth daily.  . methocarbamol (ROBAXIN) 500 MG tablet Take 1-2 tablets (500-1,000 mg total) by mouth every 8 (eight) hours as needed for muscle spasms.  . metoprolol tartrate (LOPRESSOR) 25 MG tablet Take 1.5 tablets (37.5 mg total) by mouth 2 (two) times daily.  Marland Kitchen oxybutynin (DITROPAN-XL) 10 MG 24 hr tablet TAKE 1 TABLET EVERY DAY  . rOPINIRole (REQUIP) 0.5 MG tablet Take 1 tablet twice daily as needed  . sertraline (ZOLOFT) 25 MG tablet TAKE 1 AND 1/2 TABLETS EVERY DAY  . SYNTHROID 112 MCG tablet Take 112 mcg by mouth daily before breakfast.   . triamcinolone cream (KENALOG) 0.1 % Apply 1 application topically daily as needed.  . warfarin (COUMADIN) 5 MG tablet Take 1 tablet (5 mg total) by mouth daily.   No current facility-administered medications for this  visit. (Other)      REVIEW OF SYSTEMS: ROS    Positive for: Gastrointestinal, Musculoskeletal, Endocrine, Cardiovascular, Eyes   Negative for: Constitutional, Neurological, Skin, Genitourinary, HENT, Respiratory, Psychiatric, Allergic/Imm, Heme/Lymph   Last edited by Doneen Poisson on 05/15/2020  8:44 AM. (History)       ALLERGIES Allergies  Allergen Reactions  . Mirapex [Pramipexole Dihydrochloride] Itching and Swelling  . Penicillins Rash  . Gabapentin Other (See Comments)    Couldn't move legs during the night after taking.  . Levothyroxine Itching  . Symbicort [Budesonide-Formoterol Fumarate] Itching    PAST MEDICAL HISTORY Past Medical History:   Diagnosis Date  . Cancer [facility])    breast   . Coronary artery disease due to lipid rich plaque 04/13/2016  . Diabetes mellitus without complication ([place])   . Diverticulosis of intestine without bleeding 12/13/2016  . History of right mastectomy 11/15/2014  . Hypothyroidism   . Overactive bladder   . Paget's disease of the bone   . Paroxysmal atrial fibrillation ([place]) 06/19/2014  . Restless leg syndrome    Past Surgical History:  Procedure Laterality Date  . ABDOMINAL HYSTERECTOMY  1999  . APPENDECTOMY     as a child  . CATARACT EXTRACTION Bilateral Fall 2013   Dr. Charise Killian  . CATARACT EXTRACTION, BILATERAL    . EYE SURGERY Bilateral Fall 2013   Cat Sx - Dr. Charise Killian  . MASTECTOMY Right     FAMILY HISTORY Family History  Problem Relation Age of Onset  . Breast cancer Mother   . Colon cancer Father   . Kidney cancer Father   . Bladder Cancer Sister   . Suicidality Brother   . Heart attack Maternal Grandmother   . Breast cancer Sister   . Stomach cancer Sister   . Ovarian cancer Sister   . Breast cancer Sister   . Breast cancer Sister   . Diabetes Sister   . Other Brother        parathyroid disorder; Engelmann's disease  . Angelman syndrome Son   . Graves' disease Daughter     SOCIAL HISTORY Social History   Tobacco Use  . Smoking status: Former Research scientist (life sciences)  . Smokeless tobacco: Never Used  Vaping Use  . Vaping Use: Never used  Substance Use Topics  . Alcohol use: No  . Drug use: No         OPHTHALMIC EXAM:  Base Eye Exam    Visual Acuity (Snellen - Linear)      Right Left   Dist [place] 20/30 -2 20/30 -1   Dist ph [place] 20/25 -2 20/25 -2       Tonometry (Tonopen, 8:49 AM)      Right Left   Pressure 08 10       Pupils      Dark Light Shape React APD   Right 3 2 Round Minimal 0   Left 3 2 Round Minimal 0       Visual Fields      Left Right    Full Full       Extraocular Movement      Right Left    Full Full       Neuro/Psych    Oriented x3: Yes    Mood/Affect: Normal       Dilation    Both eyes: 1.0% Mydriacyl, 2.5% Phenylephrine @ 8:49 AM        Slit Lamp and Fundus Exam    Slit Lamp Exam  Right Left   Lids/Lashes Dermatochalasis - upper lid, Meibomian gland dysfunction, Telangiectasia Dermatochalasis - upper lid, Meibomian gland dysfunction, Telangiectasia   Conjunctiva/Sclera White and quiet, heavy pigmentation superior hemisphere White and quiet   Cornea Arcus, temporal cataract wounds, 2+ Punctate epithelial erosions Arcus, well healed temporal cataract wounds, 2+ Punctate epithelial erosions, nasal LRI   Anterior Chamber Deep and quiet Deep and quiet   Iris Round and dilated, No NVI Round and dilated, No NVI   Lens PC IOL in good position PC IOL in good position   Vitreous Vitreous syneresis Vitreous syneresis       Fundus Exam      Right Left   Disc Pink and Sharp, Compact mild Pallor, Sharp rim   C/D Ratio 0.5 0.2   Macula Flat, Good foveal reflex, trace Epiretinal membrane, mild Retinal pigment epithelial mottling, No heme or edema Flat, Blunted foveal reflex, mild Retinal pigment epithelial mottling, No heme or edema   Vessels attenuated, mild tortuousity attenuated, mild tortuousity   Periphery Attached, mild peripheral drusen Attached, scattered peripheral drusen, focal, patchy RPE changes nasal periphery          IMAGING AND PROCEDURES  Imaging and Procedures for @TODAY @  XR Cervical Spine 2 or 3 views       AP lateral cervical spine x-rays are obtained and reviewed.  This shows some reversal of normal curvature and anterior spurring at C4-5 C5-6 with disc space narrowing more prominent mid cervical region.  Negative for fracture.  Impression cervical spondylosis disc base narrowing more prominent at C4-5, C5-6.       XR Lumbar Spine 2-3 Views       AP lateral lumbar spine images are obtained and reviewed.  This shows loss of normal lordosis mild calcification abdominal aorta.  Narrowing with  slight retrolisthesis at L2-3 disc space narrowing L3-4 and slight retrolisthesis L4-5.  Hip joint sacroiliac joints are unimpressive.  Impression: Multilevel lumbar disc degeneration as above.       OCT, Retina - OU - Both Eyes       Right Eye Quality was good. Central Foveal Thickness: 273. Progression has been stable. Findings include normal foveal contour, no SRF, no IRF (Rare drusen and peripheral ORA).   Left Eye Quality was good. Central Foveal Thickness: 263. Progression has been stable. Findings include normal foveal contour, no IRF, no SRF (Rare drusen, irregular RPE contour -- slightly improved).   Notes *Images captured and stored on drive  Diagnosis / Impression:  NFP, no IRF/SRF OU OD: Rare drusen and peripheral ORA OS: Rare drusen, irregular RPE contour -- improved from prior  Clinical management:  See below  Abbreviations: NFP - Normal foveal profile. CME - cystoid macular edema. PED - pigment epithelial detachment. IRF - intraretinal fluid. SRF - subretinal fluid. EZ - ellipsoid zone. ERM - epiretinal membrane. ORA - outer retinal atrophy. ORT - outer retinal tubulation. SRHM - subretinal hyper-reflective material        Fluorescein Angiography Optos (Transit OS)       Right Eye   Progression has been stable. Early phase findings include staining. Mid/Late phase findings include staining.   Left Eye   Progression has been stable. Early phase findings include normal observations. Mid/Late phase findings include staining, window defect.   Notes **Images stored on drive**  Impression: OD: scattered non-specific focal, peripheral staining -- otherwise normal study OS: normal study -- improved filling time compared to prior  ASSESSMENT/PLAN:    ICD-10-CM   1. Retinal ischemia  H35.82 Fluorescein Angiography Optos (Transit OS)  2. Retinal edema  H35.81 OCT, Retina - OU - Both Eyes  3. Essential hypertension  I10   4.  Hypertensive retinopathy of both eyes  H35.033 Fluorescein Angiography Optos (Transit OS)  5. Pseudophakia of both eyes  Z96.1    1,2. Ocular/retinal ischemia OS  - pt reports recent history of near syncopal episodes -- notably hypotensive during episodes  - pt awoke one morning prior to initial consult with decreased vision OS  - pt [facility] ED and had MRI brain to r/o stroke on 3.24 -- no recent infarction, hemorrhage or mass -- just microvascular ischemic changes  - BCVA today 20/25  - exam without any significant pathologic findings   - OCT without IRF/SRF but RPE contour quite irregular  - FA 4.14.21 shows delayed filling time  - repeat FA 12.9.21 with improved filling time  - suspect near syncopal/hypotensive episodes + visual episodes may have vascular etiology  - MRI was negative on 3.24.21  - pt had event monitor placed -- showed signs of PAF --> now on warfarin  - pt underwent echo and carotid doppler on 05.26.21, both were normal  - pt has had stable vision without episodes of decreased vision  - f/u here 9 months, DFE, OCT  3,4. Hypertensive retinopathy OU  - discussed importance of tight BP control  - monitor  5. Pseudophakia OU  - s/p CE/IOL (2013, Dr. Dolores Lory)  - beautiful surgery, doing well  - monitor   Ophthalmic Meds Ordered this visit:  No orders of the defined types were placed in this encounter.      Return in about 9 months (around 02/13/2021) for f/u ocular ischemia OS, DFE, OCT.  There are no Patient Instructions on file for this visit.   Explained the diagnoses, plan, and follow up with the patient and they expressed understanding.  Patient expressed understanding of the importance of proper follow up care.   This document serves as a record of services personally performed by Gardiner Sleeper, MD, PhD. It was created on their behalf by Leonie Douglas, an ophthalmic technician. The creation of this record is the provider's dictation and/or  activities during the visit.    Electronically signed by: Leonie Douglas COA, 05/15/20  9:59 PM   This document serves as a record of services personally performed by Gardiner Sleeper, MD, PhD. It was created on their behalf by San Jetty. Owens Shark, OA an ophthalmic technician. The creation of this record is the provider's dictation and/or activities during the visit.    Electronically signed by: San Jetty. Owens Shark, [place] 12.09.2021 9:59 PM   Gardiner Sleeper, M.D., Ph.D. Diseases & Surgery of the Retina and Vitreous [facility]  I have reviewed the above documentation for accuracy and completeness, and I agree with the above. Gardiner Sleeper, M.D., Ph.D. 05/15/20 9:59 PM   Abbreviations: M myopia (nearsighted); A astigmatism; H hyperopia (farsighted); P presbyopia; Mrx spectacle prescription;  CTL contact lenses; OD right eye; OS left eye; OU both eyes  XT exotropia; ET esotropia; PEK punctate epithelial keratitis; PEE punctate epithelial erosions; DES dry eye syndrome; MGD meibomian gland dysfunction; ATs artificial tears; PFAT's preservative free artificial tears; [place] nuclear sclerotic cataract; PSC posterior subcapsular cataract; ERM epi-retinal membrane; PVD posterior vitreous detachment; RD retinal detachment; DM diabetes mellitus; DR diabetic retinopathy; NPDR non-proliferative diabetic retinopathy; PDR proliferative diabetic  retinopathy; CSME clinically significant macular edema; DME diabetic macular edema; dbh dot blot hemorrhages; CWS cotton wool spot; POAG primary open angle glaucoma; C/D cup-to-disc ratio; HVF humphrey visual field; GVF goldmann visual field; OCT optical coherence tomography; IOP intraocular pressure; BRVO Branch retinal vein occlusion; CRVO central retinal vein occlusion; CRAO central retinal artery occlusion; BRAO branch retinal artery occlusion; RT retinal tear; SB scleral buckle; PPV pars plana vitrectomy; VH Vitreous hemorrhage; PRP panretinal laser  photocoagulation; IVK intravitreal kenalog; VMT vitreomacular traction; MH Macular hole;  NVD neovascularization of the disc; NVE neovascularization elsewhere; AREDS age related eye disease study; ARMD age related macular degeneration; POAG primary open angle glaucoma; EBMD epithelial/anterior basement membrane dystrophy; ACIOL anterior chamber intraocular lens; IOL intraocular lens; PCIOL posterior chamber intraocular lens; Phaco/IOL phacoemulsification with intraocular lens placement; [place] photorefractive keratectomy; LASIK laser assisted in situ keratomileusis; HTN hypertension; DM diabetes mellitus; COPD chronic obstructive pulmonary disease

## 2020-05-12 ENCOUNTER — Telehealth: Payer: Self-pay

## 2020-05-12 NOTE — Telephone Encounter (Signed)
Recv'd records from [facility] forwarded 5 pages to [facility] 12/6/21fbg

## 2020-05-13 ENCOUNTER — Other Ambulatory Visit: Payer: Self-pay

## 2020-05-13 ENCOUNTER — Ambulatory Visit (INDEPENDENT_AMBULATORY_CARE_PROVIDER_SITE_OTHER): Payer: Medicare Other | Admitting: Nurse Practitioner

## 2020-05-13 ENCOUNTER — Encounter: Payer: Self-pay | Admitting: Nurse Practitioner

## 2020-05-13 VITALS — BP 132/57 | HR 66 | Temp 98.5°F | Ht 65.0 in | Wt 183.0 lb

## 2020-05-13 DIAGNOSIS — K219 Gastro-esophageal reflux disease without esophagitis: Secondary | ICD-10-CM

## 2020-05-13 DIAGNOSIS — R053 Chronic cough: Secondary | ICD-10-CM | POA: Insufficient documentation

## 2020-05-13 DIAGNOSIS — I10 Essential (primary) hypertension: Secondary | ICD-10-CM | POA: Diagnosis not present

## 2020-05-13 MED ORDER — MELOXICAM 7.5 MG PO TABS: 7.5000 mg | ORAL_TABLET | Freq: Every day | ORAL | 0 refills | Status: DC

## 2020-05-13 MED ORDER — FAMOTIDINE 20 MG PO TABS: 20.0000 mg | ORAL_TABLET | Freq: Every day | ORAL | 0 refills | Status: DC

## 2020-05-13 NOTE — Assessment & Plan Note (Signed)
Well-controlled on current medication.  No changes necessary.  Continue low-sodium diet and exercise as tolerated.  Education provided with printed handouts given.

## 2020-05-13 NOTE — Patient Instructions (Addendum)
Hypertension, Adult Hypertension is another name for high blood pressure. High blood pressure forces your heart to work harder to pump blood. This can cause problems over time. There are two numbers in a blood pressure reading. There is a top number (systolic) over a bottom number (diastolic). It is best to have a blood pressure that is below 120/80. Healthy choices can help lower your blood pressure, or you may need medicine to help lower it. What are the causes? The cause of this condition is not known. Some conditions may be related to high blood pressure. What increases the risk?  Smoking.  Having type 2 diabetes mellitus, high cholesterol, or both.  Not getting enough exercise or physical activity.  Being overweight.  Having too much fat, sugar, calories, or salt (sodium) in your diet.  Drinking too much alcohol.  Having long-term (chronic) kidney disease.  Having a family history of high blood pressure.  Age. Risk increases with age.  Race. You may be at higher risk if you are African American.  Gender. Men are at higher risk than women before age 45. After age 65, women are at higher risk than men.  Having obstructive sleep apnea.  Stress. What are the signs or symptoms?  High blood pressure may not cause symptoms. Very high blood pressure (hypertensive crisis) may cause: ? Headache. ? Feelings of worry or nervousness (anxiety). ? Shortness of breath. ? Nosebleed. ? A feeling of being sick to your stomach (nausea). ? Throwing up (vomiting). ? Changes in how you see. ? Very bad chest pain. ? Seizures. How is this treated?  This condition is treated by making healthy lifestyle changes, such as: ? Eating healthy foods. ? Exercising more. ? Drinking less alcohol.  Your health care provider may prescribe medicine if lifestyle changes are not enough to get your blood pressure under control, and if: ? Your top number is above 130. ? Your bottom number is above  80.  Your personal target blood pressure may vary. Follow these instructions at home: Eating and drinking   If told, follow the DASH eating plan. To follow this plan: ? Fill one half of your plate at each meal with fruits and vegetables. ? Fill one fourth of your plate at each meal with whole grains. Whole grains include whole-wheat pasta, brown rice, and whole-grain bread. ? Eat or drink low-fat dairy products, such as skim milk or low-fat yogurt. ? Fill one fourth of your plate at each meal with low-fat (lean) proteins. Low-fat proteins include fish, chicken without skin, eggs, beans, and tofu. ? Avoid fatty meat, cured and processed meat, or chicken with skin. ? Avoid pre-made or processed food.  Eat less than 1,500 mg of salt each day.  Do not drink alcohol if: ? Your doctor tells you not to drink. ? You are pregnant, may be pregnant, or are planning to become pregnant.  If you drink alcohol: ? Limit how much you use to:  0-1 drink a day for women.  0-2 drinks a day for men. ? Be aware of how much alcohol is in your drink. In the [place] one drink equals one 12 oz bottle of beer (355 mL), one 5 oz glass of wine (148 mL), or one 1 oz glass of hard liquor (44 mL). Lifestyle   Work with your doctor to stay at a healthy weight or to lose weight. Ask your doctor what the best weight is for you.  Get at least 30 minutes of exercise most   days of the week. This may include walking, swimming, or biking.  Get at least 30 minutes of exercise that strengthens your muscles (resistance exercise) at least 3 days a week. This may include lifting weights or doing Pilates.  Do not use any products that contain nicotine or tobacco, such as cigarettes, e-cigarettes, and chewing tobacco. If you need help quitting, ask your doctor.  Check your blood pressure at home as told by your doctor.  Keep all follow-up visits as told by your doctor. This is important. Medicines  Take over-the-counter  and prescription medicines only as told by your doctor. Follow directions carefully.  Do not skip doses of blood pressure medicine. The medicine does not work as well if you skip doses. Skipping doses also puts you at risk for problems.  Ask your doctor about side effects or reactions to medicines that you should watch for. Contact a doctor if you:  Think you are having a reaction to the medicine you are taking.  Have headaches that keep coming back (recurring).  Feel dizzy.  Have swelling in your ankles.  Have trouble with your vision. Get help right away if you:  Get a very bad headache.  Start to feel mixed up (confused).  Feel weak or numb.  Feel faint.  Have very bad pain in your: ? Chest. ? Belly (abdomen).  Throw up more than once.  Have trouble breathing. Summary  Hypertension is another name for high blood pressure.  High blood pressure forces your heart to work harder to pump blood.  For most people, a normal blood pressure is less than 120/80.  Making healthy choices can help lower blood pressure. If your blood pressure does not get lower with healthy choices, you may need to take medicine. This information is not intended to replace advice given to you by your health care provider. Make sure you discuss any questions you have with your health care provider. Document Revised: 02/01/2018 Document Reviewed: 02/01/2018 Elsevier Patient Education  [place]. Gastroesophageal Reflux Disease, Adult Gastroesophageal reflux (GER) happens when acid from the stomach flows up into the tube that connects the mouth and the stomach (esophagus). Normally, food travels down the esophagus and stays in the stomach to be digested. With GER, food and stomach acid sometimes move back up into the esophagus. You may have a disease called gastroesophageal reflux disease (GERD) if the reflux:  Happens often.  Causes frequent or very bad symptoms.  Causes problems such  as damage to the esophagus. When this happens, the esophagus becomes sore and swollen (inflamed). Over time, GERD can make small holes (ulcers) in the lining of the esophagus. What are the causes? This condition is caused by a problem with the muscle between the esophagus and the stomach. When this muscle is weak or not normal, it does not close properly to keep food and acid from coming back up from the stomach. The muscle can be weak because of:  Tobacco use.  Pregnancy.  Having a certain type of hernia (hiatal hernia).  Alcohol use.  Certain foods and drinks, such as coffee, chocolate, onions, and peppermint. What increases the risk? You are more likely to develop this condition if you:  Are overweight.  Have a disease that affects your connective tissue.  Use NSAID medicines. What are the signs or symptoms? Symptoms of this condition include:  Heartburn.  Difficult or painful swallowing.  The feeling of having a lump in the throat.  A bitter taste in the  mouth.  Bad breath.  Having a lot of saliva.  Having an upset or bloated stomach.  Belching.  Chest pain. Different conditions can cause chest pain. Make sure you see your doctor if you have chest pain.  Shortness of breath or noisy breathing (wheezing).  Ongoing (chronic) cough or a cough at night.  Wearing away of the surface of teeth (tooth enamel).  Weight loss. How is this treated? Treatment will depend on how bad your symptoms are. Your doctor may suggest:  Changes to your diet.  Medicine.  Surgery. Follow these instructions at home: Eating and drinking   Follow a diet as told by your doctor. You may need to avoid foods and drinks such as: ? Coffee and tea (with or without caffeine). ? Drinks that contain alcohol. ? Energy drinks and sports drinks. ? Bubbly (carbonated) drinks or sodas. ? Chocolate and cocoa. ? Peppermint and mint flavorings. ? Garlic and onions. ? Horseradish. ? Spicy  and acidic foods. These include peppers, chili powder, curry powder, vinegar, hot sauces, and BBQ sauce. ? Citrus fruit juices and citrus fruits, such as oranges, lemons, and limes. ? Tomato-based foods. These include red sauce, chili, salsa, and pizza with red sauce. ? Fried and fatty foods. These include donuts, french fries, potato chips, and high-fat dressings. ? High-fat meats. These include hot dogs, rib eye steak, sausage, ham, and bacon. ? High-fat dairy items, such as whole milk, butter, and cream cheese.  Eat small meals often. Avoid eating large meals.  Avoid drinking large amounts of liquid with your meals.  Avoid eating meals during the 2-3 hours before bedtime.  Avoid lying down right after you eat.  Do not exercise right after you eat. Lifestyle   Do not use any products that contain nicotine or tobacco. These include cigarettes, e-cigarettes, and chewing tobacco. If you need help quitting, ask your doctor.  Try to lower your stress. If you need help doing this, ask your doctor.  If you are overweight, lose an amount of weight that is healthy for you. Ask your doctor about a safe weight loss goal. General instructions  Pay attention to any changes in your symptoms.  Take over-the-counter and prescription medicines only as told by your doctor. Do not take aspirin, ibuprofen, or other NSAIDs unless your doctor says it is okay.  Wear loose clothes. Do not wear anything tight around your waist.  Raise (elevate) the head of your bed about 6 inches (15 cm).  Avoid bending over if this makes your symptoms worse.  Keep all follow-up visits as told by your doctor. This is important. Contact a doctor if:  You have new symptoms.  You lose weight and you do not know why.  You have trouble swallowing or it hurts to swallow.  You have wheezing or a cough that keeps happening.  Your symptoms do not get better with treatment.  You have a hoarse voice. Get help right  away if:  You have pain in your arms, neck, jaw, teeth, or back.  You feel sweaty, dizzy, or light-headed.  You have chest pain or shortness of breath.  You throw up (vomit) and your throw-up looks like blood or coffee grounds.  You pass out (faint).  Your poop (stool) is bloody or black.  You cannot swallow, drink, or eat. Summary  If a person has gastroesophageal reflux disease (GERD), food and stomach acid move back up into the esophagus and cause symptoms or problems such as damage to the  esophagus.  Treatment will depend on how bad your symptoms are.  Follow a diet as told by your doctor.  Take all medicines only as told by your doctor. This information is not intended to replace advice given to you by your health care provider. Make sure you discuss any questions you have with your health care provider. Document Revised: 11/30/2017 Document Reviewed: 11/30/2017 Elsevier Patient Education  [place]. Cough, Adult A cough helps to clear your throat and lungs. A cough may be a sign of an illness or another medical condition. An acute cough may only last 2-3 weeks, while a chronic cough may last 8 or more weeks. Many things can cause a cough. They include:  Germs (viruses or bacteria) that attack the airway.  Breathing in things that bother (irritate) your lungs.  Allergies.  Asthma.  Mucus that runs down the back of your throat (postnasal drip).  Smoking.  Acid backing up from the stomach into the tube that moves food from the mouth to the stomach (gastroesophageal reflux).  Some medicines.  Lung problems.  Other medical conditions, such as heart failure or a blood clot in the lung (pulmonary embolism). Follow these instructions at home: Medicines  Take over-the-counter and prescription medicines only as told by your doctor.  Talk with your doctor before you take medicines that stop a cough (coughsuppressants). Lifestyle   Do not smoke, and try  not to be around smoke. Do not use any products that contain nicotine or tobacco, such as cigarettes, e-cigarettes, and chewing tobacco. If you need help quitting, ask your doctor.  Drink enough fluid to keep your pee (urine) pale yellow.  Avoid caffeine.  Do not drink alcohol if your doctor tells you not to drink. General instructions   Watch for any changes in your cough. Tell your doctor about them.  Always cover your mouth when you cough.  Stay away from things that make you cough, such as perfume, candles, campfire smoke, or cleaning products.  If the air is dry, use a cool mist vaporizer or humidifier in your home.  If your cough is worse at night, try using extra pillows to raise your head up higher while you sleep.  Rest as needed.  Keep all follow-up visits as told by your doctor. This is important. Contact a doctor if:  You have new symptoms.  You cough up pus.  Your cough does not get better after 2-3 weeks, or your cough gets worse.  Cough medicine does not help your cough and you are not sleeping well.  You have pain that gets worse or pain that is not helped with medicine.  You have a fever.  You are losing weight and you do not know why.  You have night sweats. Get help right away if:  You cough up blood.  You have trouble breathing.  Your heartbeat is very fast. These symptoms may be an emergency. Do not wait to see if the symptoms will go away. Get medical help right away. Call your local emergency services (911 in the [place] Do not drive yourself to the hospital. Summary  A cough helps to clear your throat and lungs. Many things can cause a cough.  Take over-the-counter and prescription medicines only as told by your doctor.  Always cover your mouth when you cough.  Contact a doctor if you have new symptoms or you have a cough that does not get better or gets worse. This information is not intended to replace  advice given to you by your  health care provider. Make sure you discuss any questions you have with your health care provider. Document Revised: 06/12/2018 Document Reviewed: 06/12/2018 Elsevier Patient Education  [place].

## 2020-05-13 NOTE — Assessment & Plan Note (Signed)
Well-controlled on Pepcid 20 mg tablet daily by mouth.  No changes necessary, continue healthy diet and exercise regimen as tolerated.

## 2020-05-13 NOTE — Assessment & Plan Note (Addendum)
Patient is having chronic cough not well controlled in the last 1 year.  Patient has tried everything over-the-counter to relieve symptoms but with no therapeutic effect.  Patient is currently taking 100-25 mg Hyzaar for blood pressure.  Educated patient that there is a 25% chance of cough with an angiotensin II receptor blocker which includes Hyzaar, not very common side effect but a possibility.  Advised patient to discontinue Hyzaar for 1 week and follow-up with blood pressure log and to re-assess cough.  Patient verbalized understanding  Follow-up with worsening or unresolved symptoms.

## 2020-05-13 NOTE — Progress Notes (Signed)
Established Patient Office Visit  Subjective:  Patient ID: Desiree Bowers, female    DOB: May 28, 1946  Age: 74 y.o. MRN: [id number]  CC: No chief complaint on file.  Cough / Hypertension  HPI Desiree Bowers presents for Pt presents for follow up of hypertension. Patient was diagnosed in 09/24/2013.. The patient is tolerating the medication well without side effects. Compliance with treatment has been good; including taking medication as directed , maintains a healthy diet and regular exercise regimen as tolerated, and following up as directed.   Cough: Patient complains of nonproductive cough.  Symptoms began 1 year ago.  The cough is non-productive, without wheezing, dyspnea or hemoptysis and is aggravated by nothing Associated symptoms include:none. Patient does not have new pets. Patient does not have a history of asthma. Patient does not have a history of environmental allergens. Patient does not have recent travel. Patient does have a history of smoking. Patient  does not have previous Chest X-ray. Patient does not have had a PPD done.  Patient is on Hyzaar 100-25 mg tablet for blood pressure daily.   Past Medical History:  Diagnosis Date  . Cancer [facility])    breast   . Coronary artery disease due to lipid rich plaque 04/13/2016  . Diabetes mellitus without complication ([place])   . Diverticulosis of intestine without bleeding 12/13/2016  . History of right mastectomy 11/15/2014  . Hypothyroidism   . Overactive bladder   . Paget's disease of the bone   . Paroxysmal atrial fibrillation ([place]) 06/19/2014  . Restless leg syndrome     Past Surgical History:  Procedure Laterality Date  . ABDOMINAL HYSTERECTOMY  1999  . APPENDECTOMY     as a child  . CATARACT EXTRACTION Bilateral Fall 2013   Dr. Charise Killian  . CATARACT EXTRACTION, BILATERAL    . EYE SURGERY Bilateral Fall 2013   Cat Sx - Dr. Charise Killian  . MASTECTOMY Right     Family History  Problem Relation Age of Onset  . Breast cancer Mother    . Colon cancer Father   . Kidney cancer Father   . Bladder Cancer Sister   . Suicidality Brother   . Heart attack Maternal Grandmother   . Breast cancer Sister   . Stomach cancer Sister   . Ovarian cancer Sister   . Breast cancer Sister   . Breast cancer Sister   . Diabetes Sister   . Other Brother        parathyroid disorder; Engelmann's disease  . Angelman syndrome Son   . Graves' disease Daughter     Social History   Socioeconomic History  . Marital status: Widowed    Spouse name: Not on file  . Number of children: 4  . Years of education: Not on file  . Highest education level: GED or equivalent  Occupational History  . Occupation: Retired  Tobacco Use  . Smoking status: Former Research scientist (life sciences)  . Smokeless tobacco: Never Used  Vaping Use  . Vaping Use: Never used  Substance and Sexual Activity  . Alcohol use: No  . Drug use: No  . Sexual activity: Not Currently  Other Topics Concern  . Not on file  Social History Narrative  . Not on file   Social Determinants of Health   Financial Resource Strain:   . Difficulty of Paying Living Expenses: Not on file  Food Insecurity:   . Worried About Charity fundraiser in the Last Year: Not on file  . Ran  Out of Food in the Last Year: Not on file  Transportation Needs:   . Lack of Transportation (Medical): Not on file  . Lack of Transportation (Non-Medical): Not on file  Physical Activity:   . Days of Exercise per Week: Not on file  . Minutes of Exercise per Session: Not on file  Stress:   . Feeling of Stress : Not on file  Social Connections:   . Frequency of Communication with Friends and Family: Not on file  . Frequency of Social Gatherings with Friends and Family: Not on file  . Attends Religious Services: Not on file  . Active Member of Clubs or Organizations: Not on file  . Attends Archivist Meetings: Not on file  . Marital Status: Not on file  Intimate Partner Violence:   . Fear of Current or  Ex-Partner: Not on file  . Emotionally Abused: Not on file  . Physically Abused: Not on file  . Sexually Abused: Not on file    Outpatient Medications Prior to Visit  Medication Sig Dispense Refill  . amitriptyline (ELAVIL) 25 MG tablet TAKE ONE TABLET DAILY AT BEDTIME 30 tablet 1  . atorvastatin (LIPITOR) 10 MG tablet Take 1 tablet (10 mg total) by mouth every evening. 90 tablet 1  . calcium carbonate (OSCAL) 1500 (600 Ca) MG TABS tablet Take 2 tablets by mouth daily.     . cyclobenzaprine (FLEXERIL) 10 MG tablet Take 10 mg by mouth 3 (three) times daily as needed for muscle spasms.    . diclofenac Sodium (VOLTAREN) 1 % GEL Apply 2 g topically 4 (four) times daily. 350 g 2  . esomeprazole (NEXIUM 24HR CLEAR MINIS) 20 MG capsule Take 1 capsule (20 mg total) by mouth daily at 12 noon. 90 capsule 1  . fluticasone furoate-vilanterol (BREO ELLIPTA) 200-25 MCG/INH AEPB Inhale 1 puff into the lungs daily. 1 each 11  . furosemide (LASIX) 20 MG tablet Take 20 mg by mouth daily as needed for edema.     Marland Kitchen loratadine (CLARITIN) 10 MG tablet Take 10 mg by mouth daily.    Marland Kitchen losartan-hydrochlorothiazide (HYZAAR) 100-25 MG tablet TAKE 1 TABLET EVERY DAY 90 tablet 1  . magnesium oxide (MAG-OX) 400 (241.3 Mg) MG tablet Take 400 mg by mouth daily.     . methocarbamol (ROBAXIN) 500 MG tablet Take 1-2 tablets (500-1,000 mg total) by mouth every 8 (eight) hours as needed for muscle spasms. 30 tablet 2  . metoprolol tartrate (LOPRESSOR) 25 MG tablet Take 1.5 tablets (37.5 mg total) by mouth 2 (two) times daily. 270 tablet 1  . oxybutynin (DITROPAN-XL) 10 MG 24 hr tablet TAKE 1 TABLET EVERY DAY 90 tablet 1  . rOPINIRole (REQUIP) 0.5 MG tablet Take 1 tablet twice daily as needed 60 tablet 1  . sertraline (ZOLOFT) 25 MG tablet TAKE 1 AND 1/2 TABLETS EVERY DAY 135 tablet 1  . SYNTHROID 112 MCG tablet Take 112 mcg by mouth daily before breakfast.     . triamcinolone cream (KENALOG) 0.1 % Apply 1 application topically  daily as needed.    . warfarin (COUMADIN) 5 MG tablet Take 1 tablet (5 mg total) by mouth daily. 30 tablet 3  . famotidine (PEPCID) 20 MG tablet Take 20 mg by mouth daily.     No facility-administered medications prior to visit.    Allergies  Allergen Reactions  . Mirapex [Pramipexole Dihydrochloride] Itching and Swelling  . Penicillins Rash  . Gabapentin Other (See Comments)    Couldn't  move legs during the night after taking.  . Levothyroxine Itching  . Symbicort [Budesonide-Formoterol Fumarate] Itching    ROS Review of Systems  Constitutional: Negative for chills and fatigue.  HENT: Negative for congestion and sore throat.   Respiratory: Positive for cough. Negative for choking, chest tightness and shortness of breath.   All other systems reviewed and are negative.     Objective:    Physical Exam Vitals reviewed.  Constitutional:      Appearance: Normal appearance.  HENT:     Head: Normocephalic.     Nose: Nose normal. No congestion.     Mouth/Throat:     Mouth: Mucous membranes are moist.     Pharynx: Oropharynx is clear.  Eyes:     Conjunctiva/sclera: Conjunctivae normal.  Cardiovascular:     Rate and Rhythm: Normal rate and regular rhythm.     Pulses: Normal pulses.     Heart sounds: Normal heart sounds.  Pulmonary:     Effort: Pulmonary effort is normal.     Breath sounds: Normal breath sounds.     Comments: Chronic cough Chest:     Chest wall: No tenderness.  Abdominal:     General: Bowel sounds are normal.  Musculoskeletal:        General: Normal range of motion.  Skin:    General: Skin is warm.  Neurological:     Mental Status: She is alert and oriented to person, place, and time.  Psychiatric:        Mood and Affect: Mood normal.     BP (!) 132/57   Pulse 66   Temp 98.5 F (36.9 C) (Temporal)   Ht 5\' 5"  (1.651 m)   Wt 183 lb (83 kg)   SpO2 96%   BMI 30.45 kg/m  Wt Readings from Last 3 Encounters:  05/13/20 183 lb (83 kg)  04/03/20  182 lb 12.8 oz (82.9 kg)  04/03/20 183 lb 9.6 oz (83.3 kg)     Health Maintenance Due  Topic Date Due  . FOOT EXAM  04/25/2020    There are no preventive care reminders to display for this patient.  Lab Results  Component Value Date   TSH 1.390 10/25/2019   Lab Results  Component Value Date   WBC WILL FOLLOW 01/25/2020   HGB WILL FOLLOW 01/25/2020   HCT WILL FOLLOW 01/25/2020   MCV WILL FOLLOW 01/25/2020   PLT WILL FOLLOW 01/25/2020   Lab Results  Component Value Date   NA 137 01/25/2020   K 3.6 01/25/2020   CO2 22 01/25/2020   GLUCOSE 121 (H) 01/25/2020   BUN 14 01/25/2020   CREATININE 0.79 01/25/2020   BILITOT <0.2 01/25/2020   ALKPHOS 96 01/25/2020   AST 19 01/25/2020   ALT 16 01/25/2020   PROT 6.9 01/25/2020   ALBUMIN 4.0 01/25/2020   CALCIUM 9.2 01/25/2020   ANIONGAP 10 08/29/2019   Lab Results  Component Value Date   CHOL 182 01/25/2020   Lab Results  Component Value Date   HDL 34 (L) 01/25/2020   Lab Results  Component Value Date   LDLCALC 114 (H) 01/25/2020   Lab Results  Component Value Date   TRIG 193 (H) 01/25/2020   Lab Results  Component Value Date   CHOLHDL 5.4 (H) 01/25/2020   Lab Results  Component Value Date   HGBA1C 6.7 01/25/2020      Assessment & Plan:   Problem List Items Addressed This Visit  Cardiovascular and Mediastinum   Essential (primary) hypertension    Well-controlled on current medication.  No changes necessary.  Continue low-sodium diet and exercise as tolerated.  Education provided with printed handouts given.        Digestive   Gastroesophageal reflux disease without esophagitis    Well-controlled on Pepcid 20 mg tablet daily by mouth.  No changes necessary, continue healthy diet and exercise regimen as tolerated.        Relevant Medications   famotidine (PEPCID) 20 MG tablet     Other   Chronic cough - Primary    Patient is having chronic cough not well controlled in the last 1 year.   Patient has tried everything over-the-counter to relieve symptoms but with no therapeutic effect.  Patient is currently taking 100-25 mg Hyzaar for blood pressure.  Educated patient that there is a 25% chance of cough with an angiotensin II receptor blocker which includes Hyzaar, not very common side effect but a possibility.  Advised patient to discontinue Hyzaar for 1 week and follow-up with blood pressure log and to re-assess cough.  Patient verbalized understanding  Follow-up with worsening or unresolved symptoms.          Meds ordered this encounter  Medications  . meloxicam (MOBIC) 7.5 MG tablet    Sig: Take 1 tablet (7.5 mg total) by mouth daily.    Dispense:  60 tablet    Refill:  0    Order Specific Question:   Supervising Provider    Answer:   Caryl Pina A [id number]  . famotidine (PEPCID) 20 MG tablet    Sig: Take 1 tablet (20 mg total) by mouth daily.    Dispense:  60 tablet    Refill:  0    Order Specific Question:   Supervising Provider    Answer:   Caryl Pina A [[id number]]    Follow-up: Return if symptoms worsen or fail to improve.    Ivy Lynn, NP

## 2020-05-15 ENCOUNTER — Ambulatory Visit (INDEPENDENT_AMBULATORY_CARE_PROVIDER_SITE_OTHER): Payer: Medicare Other | Admitting: Orthopaedic Surgery

## 2020-05-15 ENCOUNTER — Ambulatory Visit (INDEPENDENT_AMBULATORY_CARE_PROVIDER_SITE_OTHER): Payer: Medicare Other

## 2020-05-15 ENCOUNTER — Encounter: Payer: Self-pay | Admitting: Orthopaedic Surgery

## 2020-05-15 ENCOUNTER — Ambulatory Visit (INDEPENDENT_AMBULATORY_CARE_PROVIDER_SITE_OTHER): Payer: Medicare Other | Admitting: Ophthalmology

## 2020-05-15 ENCOUNTER — Other Ambulatory Visit: Payer: Self-pay

## 2020-05-15 ENCOUNTER — Encounter (INDEPENDENT_AMBULATORY_CARE_PROVIDER_SITE_OTHER): Payer: Self-pay | Admitting: Ophthalmology

## 2020-05-15 VITALS — Ht 65.0 in | Wt 183.0 lb

## 2020-05-15 DIAGNOSIS — H35033 Hypertensive retinopathy, bilateral: Secondary | ICD-10-CM

## 2020-05-15 DIAGNOSIS — M542 Cervicalgia: Secondary | ICD-10-CM

## 2020-05-15 DIAGNOSIS — G8929 Other chronic pain: Secondary | ICD-10-CM

## 2020-05-15 DIAGNOSIS — Z961 Presence of intraocular lens: Secondary | ICD-10-CM

## 2020-05-15 DIAGNOSIS — M545 Low back pain, unspecified: Secondary | ICD-10-CM | POA: Diagnosis not present

## 2020-05-15 DIAGNOSIS — I10 Essential (primary) hypertension: Secondary | ICD-10-CM | POA: Diagnosis not present

## 2020-05-15 DIAGNOSIS — H3581 Retinal edema: Secondary | ICD-10-CM

## 2020-05-15 DIAGNOSIS — H3582 Retinal ischemia: Secondary | ICD-10-CM | POA: Diagnosis not present

## 2020-05-20 NOTE — Progress Notes (Signed)
Office Visit Note   Patient: Desiree Bowers           Date of Birth: 07/12/1945           MRN: [id number] Visit Date: 05/15/2020              Requested by: Desiree Bowers, [place],  [place] [address] PCP: Desiree Brooklyn, FNP   Assessment & Plan: Visit Diagnoses:  1. Neck pain   2. Chronic bilateral low back pain, unspecified whether sciatica present     Plan: Patient has some retrolisthesis at L4-5 on x-ray.  She has neurogenic claudication symptoms with standing and walking.  We will proceed with MRI scan rule out lumbar stenosis and office follow-up after MRI scan for review.  She has been on medication, Tylenol, Aspercreme, use of a cane, rest.  Symptoms are gradually progressed over the last 6 months.  She has had 1 falling episode when her legs gave way after prolonged walking.  Office follow-up after MRI  Follow-Up Instructions: No follow-ups on file.   Orders:  Orders Placed This Encounter  Procedures  . XR Cervical Spine 2 or 3 views  . XR Lumbar Spine 2-3 Views  . MR Lumbar Spine w/o contrast   No orders of the defined types were placed in this encounter.     Procedures: No procedures performed   Clinical Data: No additional findings.   Subjective: Chief Complaint  Patient presents with  . Neck - Pain  . Lower Back - Pain    HPI 74 year old female has not been seen in 2 years returns with progressive back pain worse on the right than left.  She has trouble standing and walking.  She gets some relief when she sits.  She does have some neck pain but does not seem to be associated with the low back pain.  She also occasionally has some aching in her right shoulder.  She states she fell a few months ago and thinks maybe her back is gotten worse since that time.  She has an area where she is sensitive to the lumbosacral junction.  She has tried Aspercreme Tylenol, use of the cane without relief.  She does have a past history of angina type 2  diabetes Paget's disease of the bone hypertension anxiety.  Review of Systems positive type 2 diabetes hypothyroidism Paget's disease of the bone all the systems are noncontributory to HPI.   Objective: Vital Signs: Ht 5\' 5"  (1.651 m)   Wt 183 lb (83 kg)   BMI 30.45 kg/m   Physical Exam Constitutional:      Appearance: She is well-developed.  HENT:     Head: Normocephalic.     Right Ear: External ear normal.     Left Ear: External ear normal.  Eyes:     Pupils: Pupils are equal, round, and reactive to light.  Neck:     Thyroid: No thyromegaly.     Trachea: No tracheal deviation.  Cardiovascular:     Rate and Rhythm: Normal rate.  Pulmonary:     Effort: Pulmonary effort is normal.  Abdominal:     Palpations: Abdomen is soft.  Skin:    General: Skin is warm and dry.  Neurological:     Mental Status: She is alert and oriented to person, place, and time.  Psychiatric:        Mood and Affect: Mood and affect normal.  Behavior: Behavior normal.     Ortho Exam test test.  Patient has negative logroll of the hip.  She has sciatic notch tenderness right greater than left.  Knee and ankle jerk are intact anterior tib gastrocsoleus is intact no isolated motor weakness.  Specialty Comments:  No specialty comments available.  Imaging: AP lateral lumbar spine images are obtained and reviewed. This shows loss  of normal lordosis mild calcification abdominal aorta. Narrowing with  slight retrolisthesis at L2-3 disc space narrowing L3-4 and slight  retrolisthesis L4-5. Hip joint sacroiliac joints are unimpressive.   Impression: Multilevel lumbar disc degeneration as above.  AP lateral cervical spine x-rays are obtained and reviewed.  This shows some reversal of normal curvature and anterior spurring at C4-5 C5-6 with disc space narrowing more prominent mid cervical region.  Negative for fracture.  Impression cervical spondylosis disc base narrowing more prominent at  C4-5, C5-6.   PMFS History: Patient Active Problem List   Diagnosis Date Noted  . Chronic cough 05/13/2020  . Encounter for therapeutic drug monitoring 01/08/2020  . PAF (paroxysmal atrial fibrillation) ([place]) 01/01/2020  . Restless leg syndrome 06/22/2019  . Seasonal allergies 12/21/2018  . Lymphedema of right arm 08/08/2017  . Facet degeneration of lumbar region 08/04/2017  . Overactive bladder 12/13/2016  . DM type 2 with diabetic dyslipidemia ([place]) 04/13/2016  . Primary osteoarthritis involving multiple joints 11/15/2014  . Acquired hypothyroidism 11/15/2014  . Gastroesophageal reflux disease without esophagitis 04/04/2014  . GAD (generalized anxiety disorder) 04/04/2014  . ANA positive 01/18/2014  . 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 Essential (primary) hypertension 09/24/2013   Past Medical History:  Diagnosis Date  . Cancer [facility])    breast   . Coronary artery disease due to lipid rich plaque 04/13/2016  . Diabetes mellitus without complication ([place])   . Diverticulosis of intestine without bleeding 12/13/2016  . History of right mastectomy 11/15/2014  . Hypothyroidism   . Overactive bladder   . Paget's disease of the bone   . Paroxysmal atrial fibrillation ([place]) 06/19/2014  . Restless leg syndrome     Family History  Problem Relation Age of Onset  . Breast cancer Mother   . Colon cancer Father   . Kidney cancer Father   . Bladder Cancer Sister   . Suicidality Brother   . Heart attack Maternal Grandmother   . Breast cancer Sister   . Stomach cancer Sister   . Ovarian cancer Sister   . Breast cancer Sister   . Breast cancer Sister   . Diabetes Sister   . Other Brother        parathyroid disorder; Engelmann's disease  . Angelman syndrome Son   . Graves' disease Daughter     Past Surgical History:  Procedure Laterality Date  . ABDOMINAL HYSTERECTOMY  1999  . APPENDECTOMY     as a child  . CATARACT EXTRACTION Bilateral Fall 2013   Dr. Charise Bowers  . CATARACT  EXTRACTION, BILATERAL    . EYE SURGERY Bilateral Fall 2013   Cat Sx - Dr. Charise Bowers  . MASTECTOMY Right    Social History   Occupational History  . Occupation: Retired  Tobacco Use  . Smoking status: Former Research scientist (life sciences)  . Smokeless tobacco: Never Used  Vaping Use  . Vaping Use: Never used  Substance and Sexual Activity  . Alcohol use: No  . Drug use: No  . Sexual activity: Not Currently

## 2020-05-21 ENCOUNTER — Ambulatory Visit (INDEPENDENT_AMBULATORY_CARE_PROVIDER_SITE_OTHER): Payer: Medicare Other | Admitting: *Deleted

## 2020-05-21 ENCOUNTER — Telehealth: Payer: Self-pay

## 2020-05-21 DIAGNOSIS — Z5181 Encounter for therapeutic drug level monitoring: Secondary | ICD-10-CM | POA: Diagnosis not present

## 2020-05-21 DIAGNOSIS — I480 Paroxysmal atrial fibrillation: Secondary | ICD-10-CM

## 2020-05-21 LAB — POCT INR: INR: 2.4 (ref 2.0–3.0)

## 2020-05-21 NOTE — Telephone Encounter (Signed)
Patient is already on lopressor 25 mg, 1.5 tab by mouth 2x/day.  Is she needing a refill? Is her blood pressure not controlled? What values does she have on her BP? Am not clear on the blood pressure medication request. Thank you

## 2020-05-21 NOTE — Telephone Encounter (Signed)
Please review and advise on BP med change

## 2020-05-21 NOTE — Patient Instructions (Signed)
Continue warfarin 1 tablet daily except 1/2 tablet on Mondays, Wednesdays and Fridays Recheck in 4 weeks 

## 2020-05-22 NOTE — Telephone Encounter (Signed)
Patient did not know she was taking anything for her BP.  States her bp has been running fine and will keep a log of it and let us know if it starts to run high.

## 2020-05-23 ENCOUNTER — Telehealth: Payer: Self-pay | Admitting: Gastroenterology

## 2020-05-23 NOTE — Telephone Encounter (Signed)
Hi Dr. Loletha Carrow,   We received a referral from PCP to treat patient for chronic cough. We received records from [facility]. I will be sending them to you for review.   Please advise on scheduling.  Thank you

## 2020-05-23 NOTE — Telephone Encounter (Signed)
Please make the patient an appointment to see me.   To PCP:  Hello, I am happy to see your patient for what appears to be question of GERD as contributing factor in chronic cough.  I do not see any prior evaluation by a pulmonary physician, which is in order for work-up of chronic cough.  Please refer this patient to pulmonary as well.   Wilfrid Lund, MD    [facility]

## 2020-05-23 NOTE — Telephone Encounter (Signed)
Called patient to schedule left voicemail. 

## 2020-05-30 ENCOUNTER — Other Ambulatory Visit: Payer: Self-pay | Admitting: Family Medicine

## 2020-05-30 DIAGNOSIS — G2581 Restless legs syndrome: Secondary | ICD-10-CM

## 2020-06-03 ENCOUNTER — Telehealth: Payer: Self-pay | Admitting: Family Medicine

## 2020-06-03 ENCOUNTER — Telehealth: Payer: Self-pay

## 2020-06-03 NOTE — Telephone Encounter (Signed)
Never been Rx here looks like lasted ordered 2018. Please advise.

## 2020-06-03 NOTE — Telephone Encounter (Signed)
Pt needs appt to get refilled.

## 2020-06-03 NOTE — Telephone Encounter (Signed)
  Prescription Request  06/03/2020  What is the name of the medication or equipment? lasix  Have you contacted your pharmacy to request a refill? (if applicable) yes  Which pharmacy would you like this sent to? [facility] [address] [place] PHONE #[phone]   Patient notified that their request is being sent to the clinical staff for review and that they should receive a response within 2 business days.

## 2020-06-04 NOTE — Telephone Encounter (Signed)
Left message for patient to call back to schedule appointment for refill request.

## 2020-06-05 ENCOUNTER — Encounter (HOSPITAL_COMMUNITY): Payer: Self-pay

## 2020-06-05 ENCOUNTER — Other Ambulatory Visit: Payer: Self-pay

## 2020-06-05 DIAGNOSIS — R0601 Orthopnea: Secondary | ICD-10-CM | POA: Diagnosis not present

## 2020-06-05 DIAGNOSIS — E039 Hypothyroidism, unspecified: Secondary | ICD-10-CM | POA: Insufficient documentation

## 2020-06-05 DIAGNOSIS — D62 Acute posthemorrhagic anemia: Secondary | ICD-10-CM | POA: Diagnosis not present

## 2020-06-05 DIAGNOSIS — Z7901 Long term (current) use of anticoagulants: Secondary | ICD-10-CM | POA: Insufficient documentation

## 2020-06-05 DIAGNOSIS — Z853 Personal history of malignant neoplasm of breast: Secondary | ICD-10-CM | POA: Diagnosis not present

## 2020-06-05 DIAGNOSIS — J918 Pleural effusion in other conditions classified elsewhere: Secondary | ICD-10-CM | POA: Diagnosis not present

## 2020-06-05 DIAGNOSIS — R0602 Shortness of breath: Secondary | ICD-10-CM | POA: Diagnosis present

## 2020-06-05 DIAGNOSIS — Z87891 Personal history of nicotine dependence: Secondary | ICD-10-CM | POA: Insufficient documentation

## 2020-06-05 DIAGNOSIS — Z79899 Other long term (current) drug therapy: Secondary | ICD-10-CM | POA: Diagnosis not present

## 2020-06-05 DIAGNOSIS — I2510 Atherosclerotic heart disease of native coronary artery without angina pectoris: Secondary | ICD-10-CM | POA: Insufficient documentation

## 2020-06-05 DIAGNOSIS — I10 Essential (primary) hypertension: Secondary | ICD-10-CM | POA: Insufficient documentation

## 2020-06-05 DIAGNOSIS — E1169 Type 2 diabetes mellitus with other specified complication: Secondary | ICD-10-CM | POA: Diagnosis not present

## 2020-06-05 NOTE — ED Triage Notes (Addendum)
Pt reports shortness of breath when she lays down. Pt has been out of lasix and BP meds for several days and cannot get into see her PCP. Pt can speak full sentences and is ambulatory

## 2020-06-06 ENCOUNTER — Emergency Department (HOSPITAL_COMMUNITY): Payer: Medicare Other

## 2020-06-06 ENCOUNTER — Emergency Department (HOSPITAL_COMMUNITY)
Admission: EM | Admit: 2020-06-06 | Discharge: 2020-06-06 | Disposition: A | Discharge status: Home / Self Care | Payer: Medicare Other | Attending: Emergency Medicine | Admitting: Emergency Medicine

## 2020-06-06 DIAGNOSIS — D649 Anemia, unspecified: Secondary | ICD-10-CM

## 2020-06-06 DIAGNOSIS — D62 Acute posthemorrhagic anemia: Secondary | ICD-10-CM | POA: Diagnosis not present

## 2020-06-06 DIAGNOSIS — J90 Pleural effusion, not elsewhere classified: Secondary | ICD-10-CM

## 2020-06-06 DIAGNOSIS — I10 Essential (primary) hypertension: Secondary | ICD-10-CM

## 2020-06-06 LAB — COMPREHENSIVE METABOLIC PANEL
ALT: 24 U/L (ref 0–44)
AST: 32 U/L (ref 15–41)
Albumin: 3.6 g/dL (ref 3.5–5.0)
Alkaline Phosphatase: 86 U/L (ref 38–126)
Anion gap: 10 (ref 5–15)
BUN: 19 mg/dL (ref 8–23)
CO2: 27 mmol/L (ref 22–32)
Calcium: 8.8 mg/dL — ABNORMAL LOW (ref 8.9–10.3)
Chloride: 97 mmol/L — ABNORMAL LOW (ref 98–111)
Creatinine, Ser: 0.75 mg/dL (ref 0.44–1.00)
GFR, Estimated: >60 mL/min (ref >60)
Glucose, Bld: 110 mg/dL — ABNORMAL HIGH (ref 70–99)
Potassium: 4.1 mmol/L (ref 3.5–5.1)
Sodium: 134 mmol/L — ABNORMAL LOW (ref 135–145)
Total Bilirubin: 0.6 mg/dL (ref 0.3–1.2)
Total Protein: 7.3 g/dL (ref 6.5–8.1)

## 2020-06-06 LAB — CBC WITH DIFFERENTIAL/PLATELET
Abs Immature Granulocytes: 0.03 10*3/uL (ref 0.00–0.07)
Basophils Absolute: 0 10*3/uL (ref 0.0–0.1)
Basophils Relative: 0 %
Eosinophils Absolute: 0.1 10*3/uL (ref 0.0–0.5)
Eosinophils Relative: 2 %
HCT: 26.9 % — ABNORMAL LOW (ref 36.0–46.0)
Hemoglobin: 7.8 g/dL — ABNORMAL LOW (ref 12.0–15.0)
Immature Granulocytes: 1 %
Lymphocytes Relative: 44 %
Lymphs Abs: 2.8 10*3/uL (ref 0.7–4.0)
MCH: 20.7 pg — ABNORMAL LOW (ref 26.0–34.0)
MCHC: 29 g/dL — ABNORMAL LOW (ref 30.0–36.0)
MCV: 71.4 fL — ABNORMAL LOW (ref 80.0–100.0)
Monocytes Absolute: 0.6 10*3/uL (ref 0.1–1.0)
Monocytes Relative: 9 %
Neutro Abs: 2.8 10*3/uL (ref 1.7–7.7)
Neutrophils Relative %: 44 %
Platelets: 359 10*3/uL (ref 150–400)
RBC: 3.77 MIL/uL — ABNORMAL LOW (ref 3.87–5.11)
RDW: 19.2 % — ABNORMAL HIGH (ref 11.5–15.5)
WBC: 6.3 10*3/uL (ref 4.0–10.5)
nRBC: 0.6 % — ABNORMAL HIGH (ref 0.0–0.2)

## 2020-06-06 LAB — PROTIME-INR
INR: 2.3 — ABNORMAL HIGH (ref 0.8–1.2)
Prothrombin Time: 24.4 seconds — ABNORMAL HIGH (ref 11.4–15.2)

## 2020-06-06 LAB — ABO/RH: ABO/RH(D): O POS

## 2020-06-06 LAB — PREPARE RBC (CROSSMATCH)

## 2020-06-06 LAB — POC OCCULT BLOOD, ED: Fecal Occult Bld: NEGATIVE

## 2020-06-06 LAB — BRAIN NATRIURETIC PEPTIDE: B Natriuretic Peptide: 139 pg/mL — ABNORMAL HIGH (ref 0.0–100.0)

## 2020-06-06 MED ORDER — SODIUM CHLORIDE 0.9 % IV SOLN: 10.0000 mL/h | Freq: Once | INTRAVENOUS | Status: DC

## 2020-06-06 NOTE — ED Provider Notes (Signed)
Signout from Dr. Bebe Shaggy.  74 year old female here with shortness of breath.  She is getting transfused 1 unit of blood.  Plan is to follow-up after transfusion to see if appropriate for discharge Physical Exam  BP (!) 181/72   Pulse 80   Temp 98.2 F (36.8 C) (Oral)   Resp 18   Ht 5\' 5"  (1.651 m)   Wt 83.9 kg   SpO2 95%   BMI 30.79 kg/m   Physical Exam  ED Course/Procedures     Procedures  MDM  Patient states she feels better.  She is comfortable plan for discharge and follow-up with her PCP.  Return instructions discussed       , MD 06/06/20 1339

## 2020-06-06 NOTE — ED Notes (Signed)
No adverse reaction noted to blood transfusion. Pt understands to notify nurse if any nausea, back pain or any other abnormalities not at her baseline.

## 2020-06-06 NOTE — ED Provider Notes (Signed)
[facility] EMERGENCY DEPARTMENT Provider Note   CSN: [id number] Arrival date & time: 06/05/20  2241     History Chief Complaint  Patient presents with  . Shortness of Breath    Pt says she has "fluid build up"    Desiree Bowers is a 74 y.o. female.  The history is provided by the patient.  Shortness of Breath Severity:  Moderate Onset quality:  Gradual Timing:  Intermittent Progression:  Unchanged Chronicity:  New Relieved by:  Rest Worsened by:  Activity Associated symptoms: cough   Associated symptoms: no chest pain, no fever and no vomiting   Patient with history of CAD, diabetes, paroxysmal atrial fibrillation on anticoagulation presents with shortness of breath.  She reports over the past several days she has been having increasing shortness of breath with exertion.  She also reports orthopnea.  She reports she has been out of her blood pressure medications which includes a diuretic.  She thinks she is retaining fluid.  She reports approximate 3 pound weight gain in the past week     Past Medical History:  Diagnosis Date  . Cancer [facility])    breast   . Coronary artery disease due to lipid rich plaque 04/13/2016  . Diabetes mellitus without complication ([place])   . Diverticulosis of intestine without bleeding 12/13/2016  . History of right mastectomy 11/15/2014  . Hypothyroidism   . Overactive bladder   . Paget's disease of the bone   . Paroxysmal atrial fibrillation ([place]) 06/19/2014  . Restless leg syndrome     Patient Active Problem List   Diagnosis Date Noted  . Chronic cough 05/13/2020  . Encounter for therapeutic drug monitoring 01/08/2020  . PAF (paroxysmal atrial fibrillation) ([place]) 01/01/2020  . Restless leg syndrome 06/22/2019  . Seasonal allergies 12/21/2018  . Lymphedema of right arm 08/08/2017  . Facet degeneration of lumbar region 08/04/2017  . Overactive bladder 12/13/2016  . 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 Primary  osteoarthritis involving multiple joints 11/15/2014  . Acquired hypothyroidism 11/15/2014  . Gastroesophageal reflux disease without esophagitis 04/04/2014  . GAD (generalized anxiety disorder) 04/04/2014  . ANA positive 01/18/2014  . 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 Essential (primary) hypertension 09/24/2013    Past Surgical History:  Procedure Laterality Date  . ABDOMINAL HYSTERECTOMY  1999  . APPENDECTOMY     as a child  . CATARACT EXTRACTION Bilateral Fall 2013   Dr. Charise Killian  . CATARACT EXTRACTION, BILATERAL    . EYE SURGERY Bilateral Fall 2013   Cat Sx - Dr. Charise Killian  . MASTECTOMY Right      OB History   No obstetric history on file.     Family History  Problem Relation Age of Onset  . Breast cancer Mother   . Colon cancer Father   . Kidney cancer Father   . Bladder Cancer Sister   . Suicidality Brother   . Heart attack Maternal Grandmother   . Breast cancer Sister   . Stomach cancer Sister   . Ovarian cancer Sister   . Breast cancer Sister   . Breast cancer Sister   . Diabetes Sister   . Other Brother        parathyroid disorder; Engelmann's disease  . Angelman syndrome Son   . Graves' disease Daughter     Social History   Tobacco Use  . Smoking status: Former Research scientist (life sciences)  . Smokeless tobacco: Never Used  Vaping Use  . Vaping Use: Never used  Substance Use Topics  . Alcohol use: No  . Drug use: No    Home Medications Prior to Admission medications   Medication Sig Start Date End Date Taking? Authorizing Provider  amitriptyline (ELAVIL) 25 MG tablet TAKE ONE TABLET DAILY AT BEDTIME 04/04/20   Loman Brooklyn, FNP  atorvastatin (LIPITOR) 10 MG tablet Take 1 tablet (10 mg total) by mouth every evening. 10/25/19   Loman Brooklyn, FNP  calcium carbonate (OSCAL) 1500 (600 Ca) MG TABS tablet Take 2 tablets by mouth daily.     [provider]  cyclobenzaprine (FLEXERIL) 10 MG tablet Take 10 mg by mouth 3 (three) times daily as needed for muscle spasms.     [provider]  diclofenac Sodium (VOLTAREN) 1 % GEL Apply 2 g topically 4 (four) times daily. 01/25/20   Loman Brooklyn, FNP  esomeprazole (NEXIUM 24HR CLEAR MINIS) 20 MG capsule Take 1 capsule (20 mg total) by mouth daily at 12 noon. 04/03/20   Ivy Lynn, NP  famotidine (PEPCID) 20 MG tablet Take 1 tablet (20 mg total) by mouth daily. 05/13/20   Ivy Lynn, NP  fluticasone furoate-vilanterol (BREO ELLIPTA) 200-25 MCG/INH AEPB Inhale 1 puff into the lungs daily. 12/26/19   Claretta Fraise, MD  furosemide (LASIX) 20 MG tablet Take 20 mg by mouth daily as needed for edema.  02/16/17   [provider]  loratadine (CLARITIN) 10 MG tablet Take 10 mg by mouth daily.    [provider]  losartan-hydrochlorothiazide (HYZAAR) 100-25 MG tablet TAKE 1 TABLET EVERY DAY 03/06/20   Hendricks Limes F, FNP  magnesium oxide (MAG-OX) 400 (241.3 Mg) MG tablet Take 400 mg by mouth daily.     [provider]  meloxicam (MOBIC) 7.5 MG tablet Take 1 tablet (7.5 mg total) by mouth daily. 05/13/20   Ivy Lynn, NP  methocarbamol (ROBAXIN) 500 MG tablet Take 1-2 tablets (500-1,000 mg total) by mouth every 8 (eight) hours as needed for muscle spasms. 04/26/19   Loman Brooklyn, FNP  metoprolol tartrate (LOPRESSOR) 25 MG tablet Take 1.5 tablets (37.5 mg total) by mouth 2 (two) times daily. 04/03/20 07/02/20  Arnoldo Lenis, MD  oxybutynin (DITROPAN-XL) 10 MG 24 hr tablet TAKE 1 TABLET EVERY DAY 01/28/20   Hendricks Limes F, FNP  rOPINIRole (REQUIP) 0.5 MG tablet TAKE 1/2 TAB DAILY FOR 2 DAYS THEN TAKE 1 TABLET DAILY IF NEEDED 06/02/20   Claretta Fraise, MD  sertraline (ZOLOFT) 25 MG tablet TAKE 1 AND 1/2 TABLETS EVERY DAY 02/04/20   Loman Brooklyn, FNP  SYNTHROID 112 MCG tablet Take 112 mcg by mouth daily before breakfast.  04/21/17   [provider]  triamcinolone cream (KENALOG) 0.1 % Apply 1 application topically daily as needed. 06/29/19   [provider]  warfarin (COUMADIN) 5 MG tablet Take 1 tablet (5 mg total) by mouth daily. 04/24/20   Arnoldo Lenis, MD    Allergies    Mirapex [pramipexole dihydrochloride], Penicillins, Gabapentin, Levothyroxine, and Symbicort [budesonide-formoterol fumarate]  Review of Systems   Review of Systems  Constitutional: Negative for fever.  HENT:       Bleeding tongue  Respiratory: Positive for cough and shortness of breath.   Cardiovascular: Negative for chest pain.  Gastrointestinal: Negative for blood in stool and vomiting.  All other systems reviewed and are negative.   Physical Exam Updated Vital Signs BP (!) 179/72   Pulse 79   Temp 98.2 F (36.8 C) (  Oral)   Resp 17   Ht 1.651 m (5\' 5" )   Wt 83.9 kg   SpO2 95%   BMI 30.79 kg/m   Physical Exam CONSTITUTIONAL: Well developed/well nourished HEAD: Normocephalic/atraumatic EYES: EOMI/PERRL ENMT: Mucous membranes moist NECK: supple no meningeal signs, no JVD SPINE/BACK:entire spine nontender CV: S1/S2 noted, no murmurs/rubs/gallops noted LUNGS: Lungs are clear to auscultation bilaterally, no apparent distress ABDOMEN: soft, nontender, no rebound or guarding, bowel sounds noted throughout abdomen GU:no cva tenderness NEURO: Pt is awake/alert/appropriate, moves all extremitiesx4.  No facial droop.   EXTREMITIES: pulses normal/equal, full ROM, minimal edema of the lower extremities SKIN: warm, color normal PSYCH: no abnormalities of mood noted, alert and oriented to situation  ED Results / Procedures / Treatments   Labs (all labs ordered are listed, but only abnormal results are displayed) Labs Reviewed  CBC WITH DIFFERENTIAL/PLATELET - Abnormal; Notable for the following components:      Result Value   RBC 3.77 (*)    Hemoglobin 7.8 (*)    HCT 26.9 (*)    MCV 71.4 (*)    MCH 20.7 (*)    MCHC 29.0 (*)    RDW 19.2 (*)    nRBC 0.6 (*)    All other components within normal limits  BRAIN NATRIURETIC PEPTIDE - Abnormal;  Notable for the following components:   B Natriuretic Peptide 139.0 (*)    All other components within normal limits  PROTIME-INR - Abnormal; Notable for the following components:   Prothrombin Time 24.4 (*)    INR 2.3 (*)    All other components within normal limits  COMPREHENSIVE METABOLIC PANEL - Abnormal; Notable for the following components:   Sodium 134 (*)    Chloride 97 (*)    Glucose, Bld 110 (*)    Calcium 8.8 (*)    All other components within normal limits  POC OCCULT BLOOD, ED  TYPE AND SCREEN  PREPARE RBC (CROSSMATCH)  ABO/RH    EKG EKG Interpretation  Date/Time:  Friday June 06 2020 03:09:30 EST Ventricular Rate:  74 PR Interval:    QRS Duration: 84 QT Interval:  407 QTC Calculation: 452 R Axis:   22 Text Interpretation: Sinus rhythm Low voltage, precordial leads Abnormal R-wave progression, early transition Confirmed by Ripley Fraise [id number]) on 06/06/2020 3:12:33 AM   Radiology DG Chest Port 1 View  Result Date: 06/06/2020 CLINICAL DATA:  Dyspnea EXAM: PORTABLE CHEST 1 VIEW COMPARISON:  07/10/2019 FINDINGS: Tiny bilateral pleural effusions have developed, right greater than left. No superimposed focal pulmonary infiltrate. No pneumothorax. Cardiac size within normal limits. Pulmonary vascularity is normal. Stable mixed lytic and sclerotic process involving the right humeral head, right clavicle, and right scapula, better assessed on prior CT examination of 07/10/2019 partial right mastectomy has been performed. IMPRESSION: Interval development of tiny bilateral pleural effusions. No superimposed confluent pulmonary infiltrate. Electronically Signed   By: Fidela Salisbury MD   On: 06/06/2020 03:49    Procedures Procedures   Medications Ordered in ED Medications  0.9 %  sodium chloride infusion (has no administration in time range)    ED Course  I have reviewed the triage vital signs and the nursing notes.  Pertinent labs & imaging results that  were available during my care of the patient were reviewed by me and considered in my medical decision making (see chart for details).    MDM Rules/Calculators/A&P  5:33 AM Patient presents with increasing dyspnea on exertion over the past several days.  Patient was concerned that she was retaining fluid and may have heart failure. Her x-ray does not show any convincing signs of CHF.  Her BNP is minimally elevated.  Patient is noted to be acutely anemic.  She denies any history of GI bleed.  However she does report she has had intermittent episodes of bleeding from her tongue recently and she is on Coumadin.  This could be the source of the anemia.  Overall patient is well-appearing. After further discussion, patient would benefit from 1 unit of packed red cells.  I suspect her shortness of breath may be due to her anemia If she tolerates this well she will be up appropriate for outpatient management and close follow-up 6:59 AM No signs of GI bleed.  Patient is ambulatory. Plan is for her to get a blood transfusion discharge home. She will need to have a repeat CBC in 1 to 2 weeks.  She did have small/trivial effusions on x-ray but no other signs of volume overload Advised that she continues to have bleeding from her tongue she will need to have this evaluated by oral surgeon or dentist Final Clinical Impression(s) / ED Diagnoses Final diagnoses:  Acute anemia  Pleural effusion  Primary hypertension    Rx / DC Orders ED Discharge Orders    None       Zadie Rhine, MD 06/06/20 0700

## 2020-06-06 NOTE — ED Notes (Signed)
No transfusion reaction noted. VS stable and pt has no new complaints.

## 2020-06-07 LAB — BPAM RBC
Blood Product Expiration Date: 202202042359
ISSUE DATE / TIME: 202112310658
Unit Type and Rh: 5100

## 2020-06-07 LAB — TYPE AND SCREEN
ABO/RH(D): O POS
Antibody Screen: NEGATIVE
Unit division: 0

## 2020-06-12 ENCOUNTER — Encounter: Payer: Self-pay | Admitting: Orthopaedic Surgery

## 2020-06-12 ENCOUNTER — Other Ambulatory Visit: Payer: Self-pay | Admitting: *Deleted

## 2020-06-12 ENCOUNTER — Ambulatory Visit (INDEPENDENT_AMBULATORY_CARE_PROVIDER_SITE_OTHER): Payer: Medicare Other | Admitting: Orthopaedic Surgery

## 2020-06-12 VITALS — Ht 65.0 in | Wt 185.0 lb

## 2020-06-12 DIAGNOSIS — M47816 Spondylosis without myelopathy or radiculopathy, lumbar region: Secondary | ICD-10-CM

## 2020-06-12 DIAGNOSIS — G2581 Restless legs syndrome: Secondary | ICD-10-CM

## 2020-06-12 MED ORDER — ROPINIROLE HCL 0.5 MG PO TABS: ORAL_TABLET | ORAL | 0 refills | Status: DC

## 2020-06-12 NOTE — Progress Notes (Signed)
Office Visit Note   Patient: Desiree Bowers           Date of Birth: Oct 31, 1945           MRN: [id number] Visit Date: 06/12/2020              Requested by: Loman Brooklyn, [place],  [place] [address] PCP: Loman Brooklyn, FNP   Assessment & Plan: Visit Diagnoses:  1. Facet degeneration of lumbar region     Plan: She states therapy really did not help several years back for her back epidurals gave her temporary relief not as good relief with the second injection as the first.  She will call if she like to go restart some therapy.  Follow-Up Instructions: No follow-ups on file.   Orders:  No orders of the defined types were placed in this encounter.  No orders of the defined types were placed in this encounter.     Procedures: No procedures performed   Clinical Data: No additional findings.   Subjective: Chief Complaint  Patient presents with  . Lower Back - Pain, Follow-up    MRI review    HPI 75 year old female returns post MRI scan of the lumbar spine for review.  Patient is on Coumadin and spent New Year's Eve in the emergency room and got admitted get transfused a unit of blood.  She is on chronic Coumadin for heart.  MRI scan is available for review which shows some lumbar facet arthropathy but no areas of high-grade stenosis or compression.  Comparison of previous imaging was performed.  Review of Systems all other systems updated unchanged other than as mentioned above.   Objective: Vital Signs: Ht 5\' 5"  (1.651 m)   Wt 185 lb (83.9 kg)   BMI 30.79 kg/m   Physical Exam Constitutional:      Appearance: She is well-developed.  HENT:     Head: Normocephalic.     Right Ear: External ear normal.     Left Ear: External ear normal.  Eyes:     Pupils: Pupils are equal, round, and reactive to light.  Neck:     Thyroid: No thyromegaly.     Trachea: No tracheal deviation.  Cardiovascular:     Rate and Rhythm: Normal rate.  Pulmonary:      Effort: Pulmonary effort is normal.  Abdominal:     Palpations: Abdomen is soft.  Skin:    General: Skin is warm and dry.  Neurological:     Mental Status: She is alert and oriented to person, place, and time.  Psychiatric:        Mood and Affect: Mood and affect normal.        Behavior: Behavior normal.     Ortho Exam negative logroll of the hips knees reach full extension.  Anterior tib EHL is intact.  No gastrocsoleus atrophy. Specialty Comments:  No specialty comments available.  Imaging: No results found.   PMFS History: Patient Active Problem List   Diagnosis Date Noted  . Chronic cough 05/13/2020  . Encounter for therapeutic drug monitoring 01/08/2020  . PAF (paroxysmal atrial fibrillation) ([place]) 01/01/2020  . Restless leg syndrome 06/22/2019  . Seasonal allergies 12/21/2018  . Lymphedema of right arm 08/08/2017  . Facet degeneration of lumbar region 08/04/2017  . Overactive bladder 12/13/2016  . DM type 2 with diabetic dyslipidemia ([place]) 04/13/2016  . Primary osteoarthritis involving multiple joints 11/15/2014  . Acquired hypothyroidism 11/15/2014  . Gastroesophageal  reflux disease without esophagitis 04/04/2014  . GAD (generalized anxiety disorder) 04/04/2014  . ANA positive 01/18/2014  . 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 Essential (primary) hypertension 09/24/2013   Past Medical History:  Diagnosis Date  . Cancer [facility])    breast   . Coronary artery disease due to lipid rich plaque 04/13/2016  . Diabetes mellitus without complication ([facility])   . Diverticulosis of intestine without bleeding 12/13/2016  . History of right mastectomy 11/15/2014  . Hypothyroidism   . Overactive bladder   . Paget's disease of the bone   . Paroxysmal atrial fibrillation ([facility]) 06/19/2014  . Restless leg syndrome     Family History  Problem Relation Age of Onset  . Breast cancer Mother   . Colon cancer Father   . Kidney cancer Father   . Bladder Cancer Sister   . Suicidality  Brother   . Heart attack Maternal Grandmother   . Breast cancer Sister   . Stomach cancer Sister   . Ovarian cancer Sister   . Breast cancer Sister   . Breast cancer Sister   . Diabetes Sister   . Other Brother        parathyroid disorder; Engelmann's disease  . Angelman syndrome Son   . Graves' disease Daughter     Past Surgical History:  Procedure Laterality Date  . ABDOMINAL HYSTERECTOMY  1999  . APPENDECTOMY     as a child  . CATARACT EXTRACTION Bilateral Fall 2013   Dr. Emmit Pomfret  . CATARACT EXTRACTION, BILATERAL    . EYE SURGERY Bilateral Fall 2013   Cat Sx - Dr. Emmit Pomfret  . MASTECTOMY Right    Social History   Occupational History  . Occupation: Retired  Tobacco Use  . Smoking status: Former Games developer  . Smokeless tobacco: Never Used  Vaping Use  . Vaping Use: Never used  Substance and Sexual Activity  . Alcohol use: No  . Drug use: No  . Sexual activity: Not Currently

## 2020-06-18 ENCOUNTER — Ambulatory Visit (INDEPENDENT_AMBULATORY_CARE_PROVIDER_SITE_OTHER): Payer: Medicare Other | Admitting: *Deleted

## 2020-06-18 DIAGNOSIS — Z5181 Encounter for therapeutic drug level monitoring: Secondary | ICD-10-CM

## 2020-06-18 DIAGNOSIS — I480 Paroxysmal atrial fibrillation: Secondary | ICD-10-CM | POA: Diagnosis not present

## 2020-06-18 LAB — POCT INR: INR: 1.7 — AB (ref 2.0–3.0)

## 2020-06-18 NOTE — Telephone Encounter (Signed)
Patient aware and verbalizes understanding and states she does not want any referrals.

## 2020-06-18 NOTE — Telephone Encounter (Signed)
Please call patient and ask if she is agreeable to a referral to a pulmonologist as well as GI as recommended by Dr. Loletha Carrow. If yes, please ask where she prefers to be referred.

## 2020-06-18 NOTE — Patient Instructions (Signed)
Take warfarin 1 tablet tonight then resume 1 tablet daily except 1/2 tablet on Mondays, Wednesdays and Fridays Recheck in 3 weeks

## 2020-06-20 ENCOUNTER — Ambulatory Visit: Payer: Medicare Other | Admitting: Nurse Practitioner

## 2020-07-03 ENCOUNTER — Ambulatory Visit: Payer: Medicare Other | Admitting: Gastroenterology

## 2020-07-07 ENCOUNTER — Other Ambulatory Visit: Payer: Self-pay | Admitting: Nurse Practitioner

## 2020-07-07 DIAGNOSIS — G2581 Restless legs syndrome: Secondary | ICD-10-CM

## 2020-07-09 ENCOUNTER — Ambulatory Visit (INDEPENDENT_AMBULATORY_CARE_PROVIDER_SITE_OTHER): Payer: Medicare Other | Admitting: *Deleted

## 2020-07-09 DIAGNOSIS — I480 Paroxysmal atrial fibrillation: Secondary | ICD-10-CM

## 2020-07-09 DIAGNOSIS — Z5181 Encounter for therapeutic drug level monitoring: Secondary | ICD-10-CM | POA: Diagnosis not present

## 2020-07-09 LAB — POCT INR: INR: 2.2 (ref 2.0–3.0)

## 2020-07-09 MED ORDER — METOPROLOL TARTRATE 25 MG PO TABS: 37.5000 mg | ORAL_TABLET | Freq: Two times a day (BID) | ORAL | 4 refills | Status: DC

## 2020-07-09 MED ORDER — WARFARIN SODIUM 5 MG PO TABS: 5.0000 mg | ORAL_TABLET | Freq: Every day | ORAL | 6 refills | Status: DC

## 2020-07-09 NOTE — Patient Instructions (Signed)
Continue warfarin 1 tablet daily except 1/2 tablet on Mondays, Wednesdays and Fridays Recheck in 4 weeks 

## 2020-07-29 ENCOUNTER — Ambulatory Visit: Payer: Medicare Other | Admitting: Family Medicine

## 2020-07-31 ENCOUNTER — Telehealth: Payer: Self-pay | Admitting: *Deleted

## 2020-07-31 NOTE — Telephone Encounter (Signed)
Patient called to see if she could come in today or tomorrow, instead of next Wednesday. Please call her back [phone].

## 2020-07-31 NOTE — Telephone Encounter (Signed)
Called pt back.  She decided to keep INR appt as previously scheduled.

## 2020-08-06 ENCOUNTER — Ambulatory Visit (INDEPENDENT_AMBULATORY_CARE_PROVIDER_SITE_OTHER): Payer: Medicare Other | Admitting: *Deleted

## 2020-08-06 DIAGNOSIS — I480 Paroxysmal atrial fibrillation: Secondary | ICD-10-CM

## 2020-08-06 DIAGNOSIS — Z5181 Encounter for therapeutic drug level monitoring: Secondary | ICD-10-CM

## 2020-08-06 LAB — POCT INR: INR: 2 (ref 2.0–3.0)

## 2020-08-06 NOTE — Patient Instructions (Signed)
Continue warfarin 1 tablet daily except 1/2 tablet on Mondays, Wednesdays and Fridays Recheck in 4 weeks 

## 2020-08-14 ENCOUNTER — Ambulatory Visit: Payer: Medicare Other

## 2020-08-27 IMAGING — CT CT CHEST W/ CM
2 of 3 series · 15 of 36 positions shown, 18 images · IV contrast (Omnipaque or Isovue)
Comparison: 07/10/2019 , 11/01/2017

CLINICAL DATA: Weakness, dizziness, diaphoresis, hypotension,
abnormal chest x-ray

EXAM:
CT CHEST WITH CONTRAST
TECHNIQUE: Multidetector CT imaging of the chest was performed during
intravenous contrast administration.
CONTRAST:  75mL OMNIPAQUE IOHEXOL 300 MG/ML  SOLN

[Series 2: routine chest with · axial · 0.64mm/px · z∈[+1132,+1386]mm · 12 of 149 slices shown, 15 images]
[im 11/149  mediastinal]
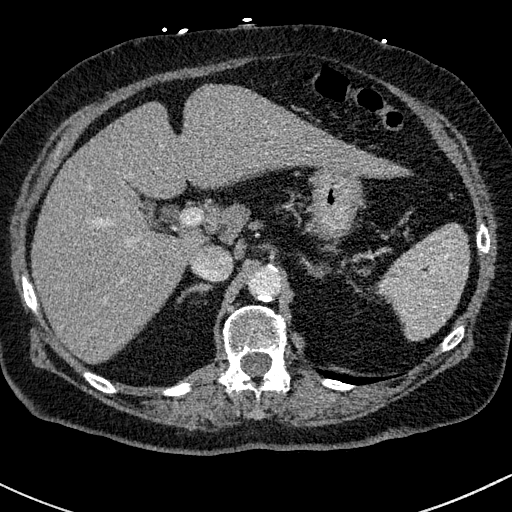
[im 11/149  lung]
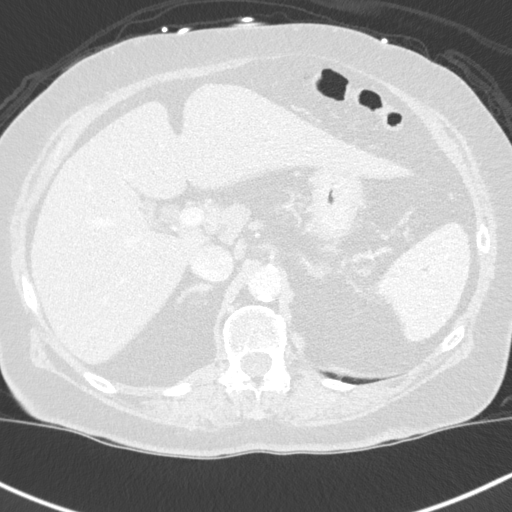
[im 22/149  lung]
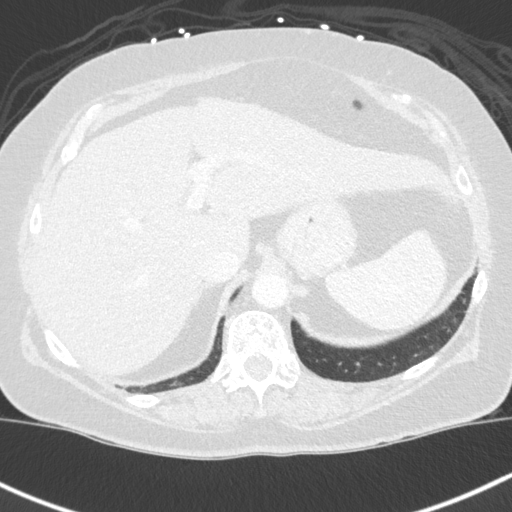
[im 33/149  lung]
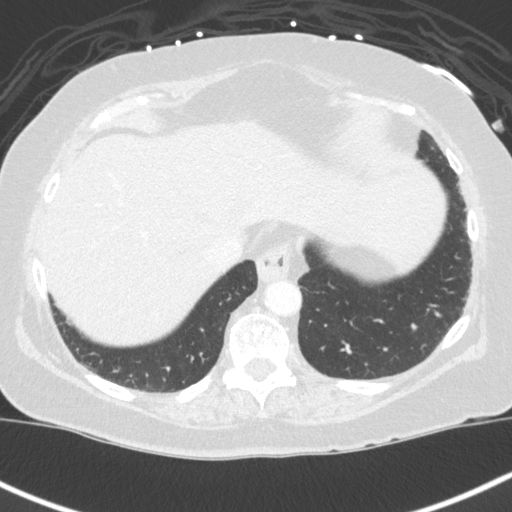
[im 44/149  lung]
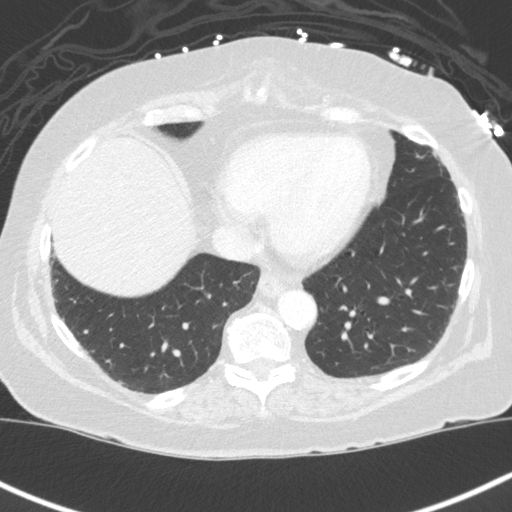
[im 55/149  mediastinal]
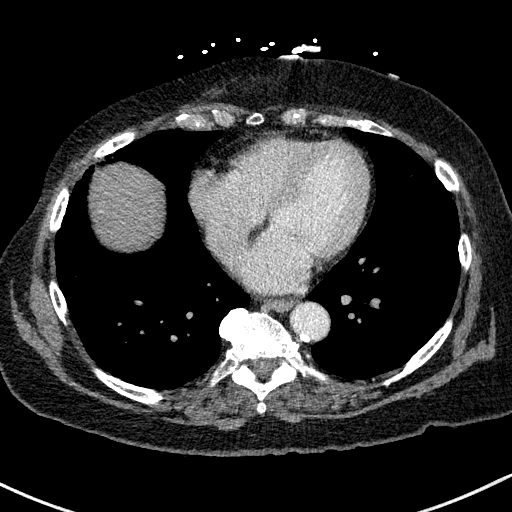
[im 55/149  lung]
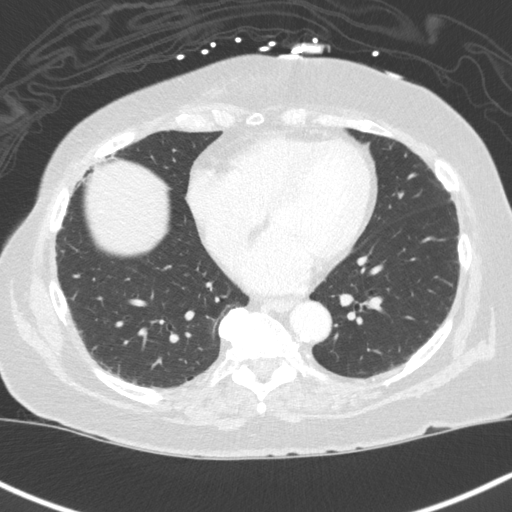
[im 66/149  lung]
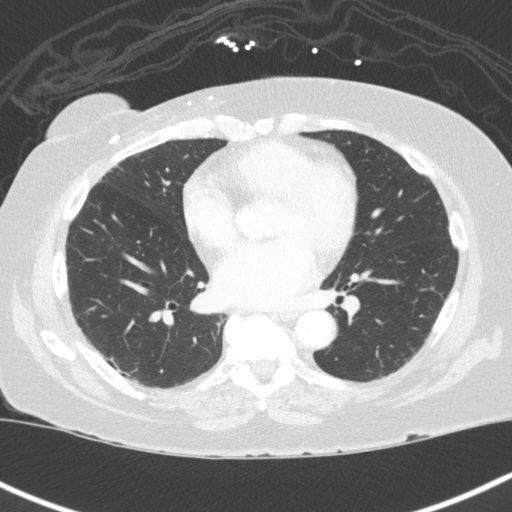
[im 83/149  lung]
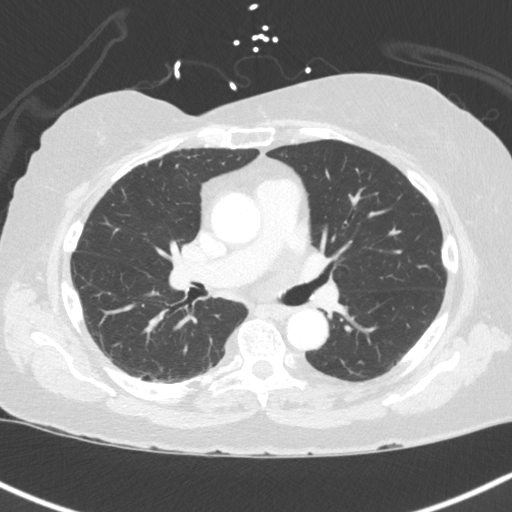
[im 94/149  lung]
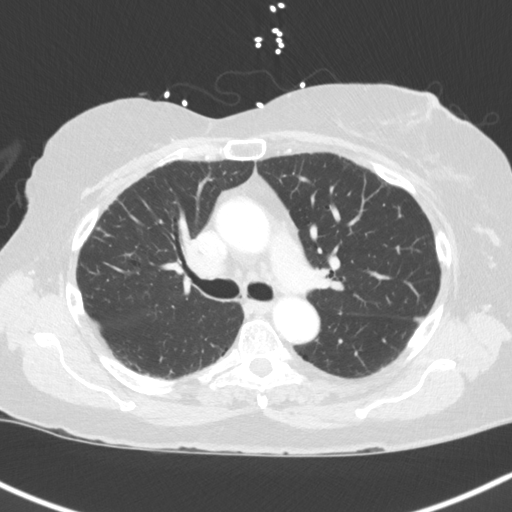
[im 105/149  mediastinal]
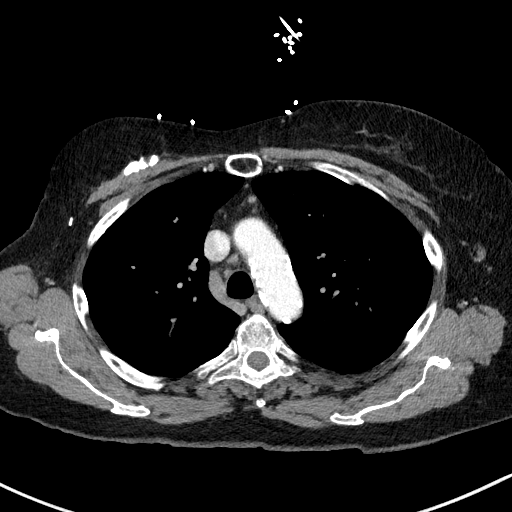
[im 105/149  lung]
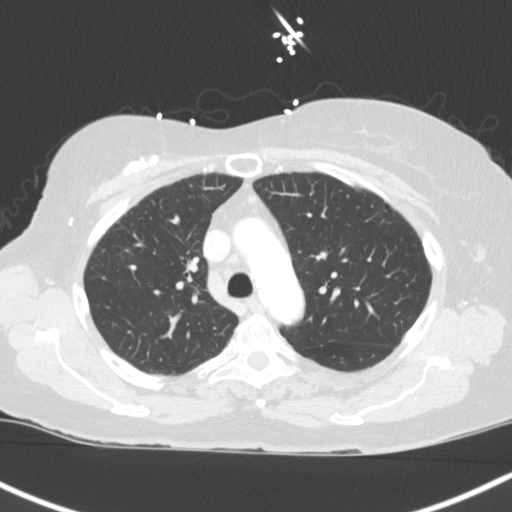
[im 116/149  lung]
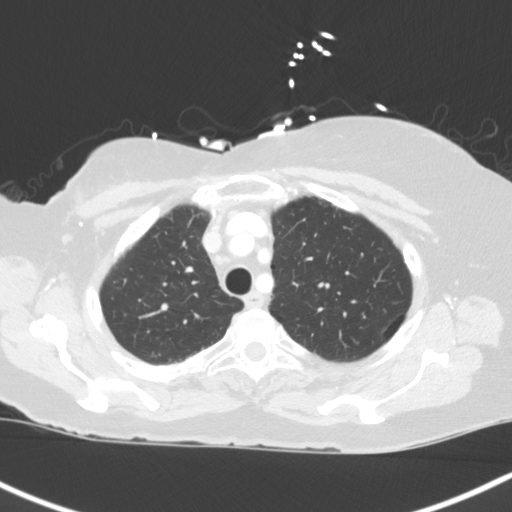
[im 127/149  lung]
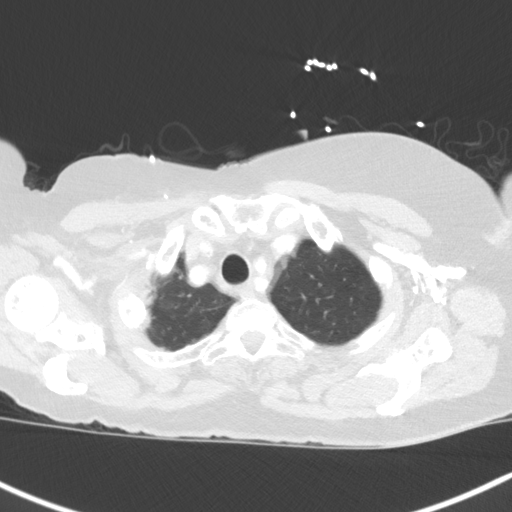
[im 138/149  lung]
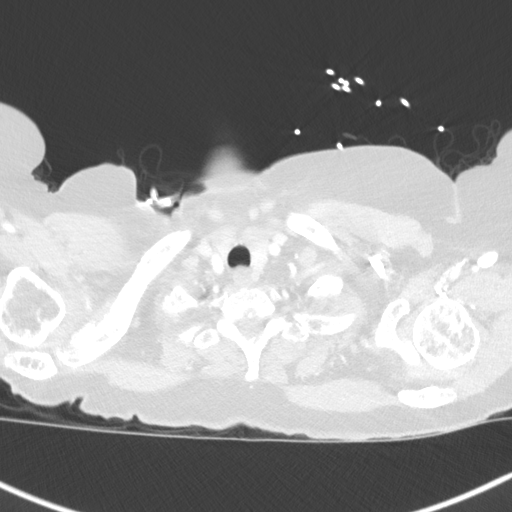

[Series 5: coronal · coronal · 0.61mm/px · 3 of 133 slices shown]
[im 27/133  lung]
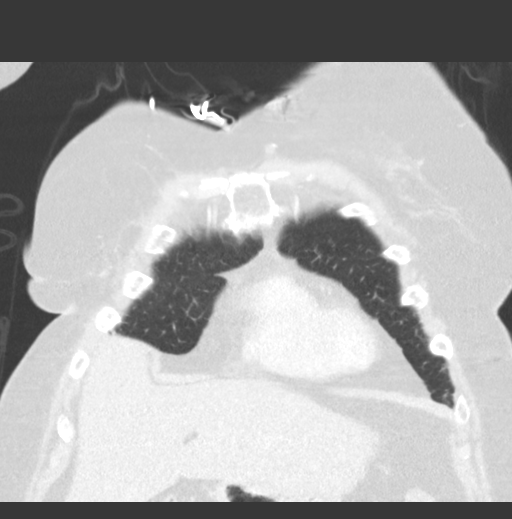
[im 53/133  lung]
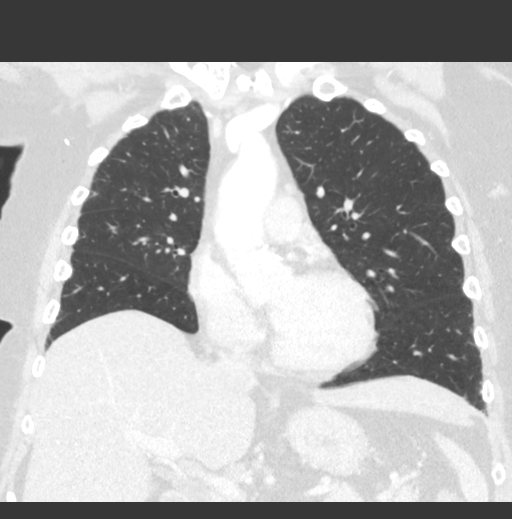
[im 80/133  lung]
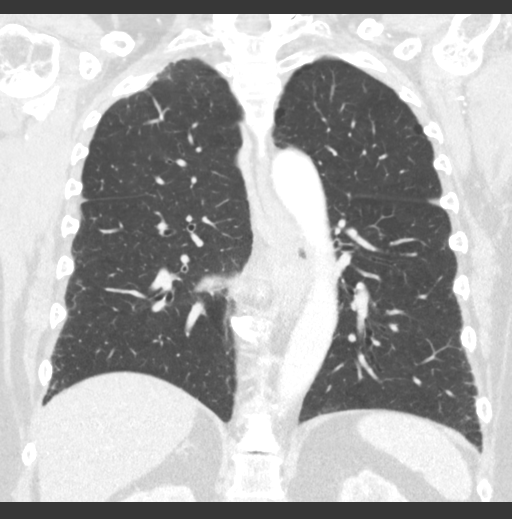

[15 of 36 positions shown; findings below may reference images not displayed]

FINDINGS: Cardiovascular: Heart is unremarkable without pericardial effusion.
Extensive atherosclerosis of the thoracic aorta.

Mediastinum/Nodes: Multiple subcentimeter lymph nodes are seen
throughout the mediastinum, nonspecific. Largest in the precarinal
region measures 7 mm in short axis reference image 47 of series 2.

Lungs/Pleura: Mild bilateral emphysema with sub pleural bulla
bilaterally. No acute airspace disease, effusion, or pneumothorax.
No pulmonary nodule or mass. Calcification within the right anterior
chest wall may account for the density seen on preceding chest
x-ray.

Upper Abdomen: Mild diffuse fatty infiltration of the liver.
Otherwise no acute findings.

Musculoskeletal: Postsurgical changes are seen from right-sided
mastectomy and reconstruction. Dystrophic calcifications within the
right breast reconstruction may account for the density seen on
chest x-ray.

There is cortical and trabecular thickening of the right clavicle as
well as the right first through third ribs. Similar appearance is
seen within the superior aspect of the right scapula, involving the
acromion and glenoid. Multiple small sclerotic lesions are seen
within the right clavicle and right humeral head, nonspecific.

No acute displaced fractures. Reconstructed images demonstrate no
additional findings.
IMPRESSION: 1. Mild emphysema.  No acute pleural or parenchymal lung disease.
2. Calcifications within the reconstructed right breast likely
account for density seen on chest x-ray.
3. Cortical and trabecular thickening involving the right clavicle,
scapula, and first through third ribs as above. Overall, little
change since 11/01/2017. I would favor Paget's disease or treated
metastatic disease in this patient with a known prior history of
breast cancer.
4. Mild fatty infiltration of the liver.

Aortic Atherosclerosis (M3LDY-SZU.U) and Emphysema (M3LDY-O97.3).

## 2020-09-03 ENCOUNTER — Ambulatory Visit (INDEPENDENT_AMBULATORY_CARE_PROVIDER_SITE_OTHER): Payer: Medicare Other | Admitting: *Deleted

## 2020-09-03 DIAGNOSIS — I480 Paroxysmal atrial fibrillation: Secondary | ICD-10-CM | POA: Diagnosis not present

## 2020-09-03 DIAGNOSIS — Z5181 Encounter for therapeutic drug level monitoring: Secondary | ICD-10-CM | POA: Diagnosis not present

## 2020-09-03 LAB — POCT INR: INR: 3.2 — AB (ref 2.0–3.0)

## 2020-09-03 NOTE — Patient Instructions (Signed)
Take warfarin 1/2 tablet tomorrow then resume 1 tablet daily except 1/2 tablet on Mondays, Wednesdays and Fridays Has taken more lasix Recheck in 4 weeks

## 2020-09-29 ENCOUNTER — Other Ambulatory Visit: Payer: Self-pay | Admitting: Cardiology

## 2020-09-30 ENCOUNTER — Ambulatory Visit (INDEPENDENT_AMBULATORY_CARE_PROVIDER_SITE_OTHER): Payer: Medicare Other | Admitting: *Deleted

## 2020-09-30 DIAGNOSIS — Z5181 Encounter for therapeutic drug level monitoring: Secondary | ICD-10-CM

## 2020-09-30 DIAGNOSIS — I480 Paroxysmal atrial fibrillation: Secondary | ICD-10-CM

## 2020-09-30 LAB — POCT INR: INR: 2.3 (ref 2.0–3.0)

## 2020-09-30 NOTE — Patient Instructions (Signed)
Continue warfarin 1 tablet daily except 1/2 tablet on Mondays, Wednesdays and Fridays Recheck in 4 weeks 

## 2020-10-02 ENCOUNTER — Encounter: Payer: Self-pay | Admitting: Cardiology

## 2020-10-02 ENCOUNTER — Encounter: Payer: Self-pay | Admitting: *Deleted

## 2020-10-02 ENCOUNTER — Ambulatory Visit (INDEPENDENT_AMBULATORY_CARE_PROVIDER_SITE_OTHER): Payer: Medicare Other | Admitting: Cardiology

## 2020-10-02 VITALS — BP 158/62 | HR 53 | Ht 65.0 in | Wt 189.0 lb

## 2020-10-02 DIAGNOSIS — I480 Paroxysmal atrial fibrillation: Secondary | ICD-10-CM

## 2020-10-02 DIAGNOSIS — I10 Essential (primary) hypertension: Secondary | ICD-10-CM

## 2020-10-02 DIAGNOSIS — I38 Endocarditis, valve unspecified: Secondary | ICD-10-CM

## 2020-10-02 MED ORDER — CHLORTHALIDONE 25 MG PO TABS: 12.5000 mg | ORAL_TABLET | Freq: Every day | ORAL | 1 refills | Status: DC

## 2020-10-02 NOTE — Patient Instructions (Signed)
Your physician recommends that you schedule a follow-up appointment in: [place] has recommended you make the following change in your medication:   START CHLORTHALIDONE 12.5 MG (1/2 TABLET) DAILY   Your physician recommends that you return for lab work in: 2 WEEKS BMP/MG  [place] 2 WEEKS AND CALL us WITH READINGS  Thank you for choosing [facility]!!

## 2020-10-02 NOTE — Progress Notes (Signed)
Clinical Summary Ms. Nieves is a 75 y.o.female seen today for follow up of the following medical problems.     1. PAF -3/282monitorshowedepisodes of PAF  - no recent palpitatins - compliant with meds - no bleeding on warfarin   2. Vision changes - seen in ER 08/29/19 with acute vision change - MRI without acute findings - seen by optho, diagnosed with retinal ischemia.  - optho has asked for echo and carotid dopplers.  - may have been related to her PAF which just diagnosed by monitor.   3. Valvular heart disease - 02/2019 echo with mild to mod MR, mod TR, - denies symptoms.   - no recent SOB/DOE, no recent edema  4. HTN - compliant with meds - home sbp's 140-160  - had some leg swelling on amlodopine per her report - reports cough on losartan per her report   Past Medical History:  Diagnosis Date  . Cancer [facility])    breast   . Coronary artery disease due to lipid rich plaque 04/13/2016  . Diabetes mellitus without complication ([place])   . Diverticulosis of intestine without bleeding 12/13/2016  . History of right mastectomy 11/15/2014  . Hypothyroidism   . Overactive bladder   . Paget's disease of the bone   . Paroxysmal atrial fibrillation ([place]) 06/19/2014  . Restless leg syndrome      Allergies  Allergen Reactions  . Mirapex [Pramipexole Dihydrochloride] Itching and Swelling  . Penicillins Rash  . Atorvastatin Other (See Comments)  . Fosamax [Alendronate] Other (See Comments)  . Gabapentin Other (See Comments)    Couldn't move legs during the night after taking.  . Levothyroxine Itching  . Symbicort [Budesonide-Formoterol Fumarate] Itching     Current Outpatient Medications  Medication Sig Dispense Refill  . amitriptyline (ELAVIL) 25 MG tablet TAKE ONE TABLET DAILY AT BEDTIME 30 tablet 1  . atorvastatin (LIPITOR) 10 MG tablet Take 1 tablet (10 mg total) by mouth every evening. 90 tablet 1  . calcium carbonate (OSCAL) 1500 (600 Ca) MG  TABS tablet Take 2 tablets by mouth daily.     . cyclobenzaprine (FLEXERIL) 10 MG tablet Take 10 mg by mouth 3 (three) times daily as needed for muscle spasms.    . diclofenac Sodium (VOLTAREN) 1 % GEL Apply 2 g topically 4 (four) times daily. 350 g 2  . esomeprazole (NEXIUM 24HR CLEAR MINIS) 20 MG capsule Take 1 capsule (20 mg total) by mouth daily at 12 noon. 90 capsule 1  . famotidine (PEPCID) 20 MG tablet Take 1 tablet (20 mg total) by mouth daily. 60 tablet 0  . fluticasone furoate-vilanterol (BREO ELLIPTA) 200-25 MCG/INH AEPB Inhale 1 puff into the lungs daily. 1 each 11  . furosemide (LASIX) 20 MG tablet Take 20 mg by mouth daily as needed for edema.     Marland Kitchen loratadine (CLARITIN) 10 MG tablet Take 10 mg by mouth daily.    Marland Kitchen losartan-hydrochlorothiazide (HYZAAR) 100-25 MG tablet TAKE 1 TABLET EVERY DAY 90 tablet 1  . magnesium oxide (MAG-OX) 400 (241.3 Mg) MG tablet Take 400 mg by mouth daily.     . meloxicam (MOBIC) 7.5 MG tablet Take 1 tablet (7.5 mg total) by mouth daily. 60 tablet 0  . methocarbamol (ROBAXIN) 500 MG tablet Take 1-2 tablets (500-1,000 mg total) by mouth every 8 (eight) hours as needed for muscle spasms. 30 tablet 2  . metoprolol tartrate (LOPRESSOR) 25 MG tablet TAKE 1 AND 1/2 TABLETS BY MOUTH 2 TIMES  DAILY 270 tablet 3  . oxybutynin (DITROPAN-XL) 10 MG 24 hr tablet TAKE 1 TABLET EVERY DAY 90 tablet 1  . rOPINIRole (REQUIP) 0.5 MG tablet TAKE 1/2 TAB DAILY FOR 2 DAYS THEN TAKE 1 TABLET DAILY IF NEEDED 30 tablet 2  . sertraline (ZOLOFT) 25 MG tablet TAKE 1 AND 1/2 TABLETS EVERY DAY 135 tablet 1  . SYNTHROID 112 MCG tablet Take 112 mcg by mouth daily before breakfast.     . triamcinolone cream (KENALOG) 0.1 % Apply 1 application topically daily as needed.    . warfarin (COUMADIN) 5 MG tablet Take 1 tablet (5 mg total) by mouth daily. 30 tablet 6   No current facility-administered medications for this visit.     Past Surgical History:  Procedure Laterality Date  .  ABDOMINAL HYSTERECTOMY  1999  . APPENDECTOMY     as a child  . CATARACT EXTRACTION Bilateral Fall 2013   Dr. Charise Killian  . CATARACT EXTRACTION, BILATERAL    . EYE SURGERY Bilateral Fall 2013   Cat Sx - Dr. Charise Killian  . MASTECTOMY Right      Allergies  Allergen Reactions  . Mirapex [Pramipexole Dihydrochloride] Itching and Swelling  . Penicillins Rash  . Atorvastatin Other (See Comments)  . Fosamax [Alendronate] Other (See Comments)  . Gabapentin Other (See Comments)    Couldn't move legs during the night after taking.  . Levothyroxine Itching  . Symbicort [Budesonide-Formoterol Fumarate] Itching      Family History  Problem Relation Age of Onset  . Breast cancer Mother   . Colon cancer Father   . Kidney cancer Father   . Bladder Cancer Sister   . Suicidality Brother   . Heart attack Maternal Grandmother   . Breast cancer Sister   . Stomach cancer Sister   . Ovarian cancer Sister   . Breast cancer Sister   . Breast cancer Sister   . Diabetes Sister   . Other Brother        parathyroid disorder; Engelmann's disease  . Angelman syndrome Son   . Graves' disease Daughter      Social History Ms. Mirarchi reports that she has quit smoking. She has never used smokeless tobacco. Ms. Therrell reports no history of alcohol use.   Review of Systems CONSTITUTIONAL: No weight loss, fever, chills, weakness or fatigue.  HEENT: Eyes: No visual loss, blurred vision, double vision or yellow sclerae.No hearing loss, sneezing, congestion, runny nose or sore throat.  SKIN: No rash or itching.  CARDIOVASCULAR: per hpi RESPIRATORY: No shortness of breath, cough or sputum.  GASTROINTESTINAL: No anorexia, nausea, vomiting or diarrhea. No abdominal pain or blood.  GENITOURINARY: No burning on urination, no polyuria NEUROLOGICAL: No headache, dizziness, syncope, paralysis, ataxia, numbness or tingling in the extremities. No change in bowel or bladder control.  MUSCULOSKELETAL: No muscle, back pain,  joint pain or stiffness.  LYMPHATICS: No enlarged nodes. No history of splenectomy.  PSYCHIATRIC: No history of depression or anxiety.  ENDOCRINOLOGIC: No reports of sweating, cold or heat intolerance. No polyuria or polydipsia.  Marland Kitchen   Physical Examination Today's Vitals   10/02/20 1116  BP: (!) 158/62  Pulse: (!) 53  SpO2: 98%  Weight: 189 lb (85.7 kg)  Height: 5\' 5"  (1.651 m)   Body mass index is 31.45 kg/m.  Gen: resting comfortably, no acute distress HEENT: no scleral icterus, pupils equal round and reactive, no palptable cervical adenopathy,  CV: RRR, no m/r/g, no jvd Resp: Clear to auscultation bilaterally GI:  abdomen is soft, non-tender, non-distended, normal bowel sounds, no hepatosplenomegaly MSK: extremities are warm, no edema.  Skin: warm, no rash Neuro:  no focal deficits Psych: appropriate affect   Diagnostic Studies  08/2015 cath [facility] FINDINGS:  Coronary Angiography 1. Left Main - Normal 2. Left anterior descending artery - the LAD gives off a small first diagonal and a large second diagonal, normal. 3. Diagonals - Normal 4. Left Circumflex - the circumflex gives off a large OM1, small OM 2, and moderately large OM 3, normal. 5. Obtuse Marginals - Normal 6. Right Coronary Artery - the RCA gives off the PDA and small PLV branches, normal.There is a conus Yakir Wenke that originates on the aorta, dual lumen with the RCA. 7. Posterior Descending Artery - Normal  Additional comments on angiography: Right Dominance  1.Normal coronary arteries. 2.LVEDP 20 3.RRA access.There was spasm of the radial artery near the level of the elbow so access was changed from Tyrrell to right femoral artery.RFA was closed with Perclose vascular suture device. 4. No Complications.   RECOMMENDATIONS:  1. Continue workup for etiology of current symptoms. 2. Followup with Dr. Hamilton Capri for with her PCP. 3. Continue risk factor modification  10/2019 echo IMPRESSIONS     1. Left ventricular ejection fraction, by estimation, is 55 to 60%. The  left ventricle has normal function. The left ventricle has no regional  wall motion abnormalities. There is mild left ventricular hypertrophy of  the basal-septal segment. Left  ventricular diastolic parameters are consistent with Grade I diastolic  dysfunction (impaired relaxation).  2. Right ventricular systolic function is low normal. The right  ventricular size is normal. There is normal pulmonary artery systolic  pressure.  3. The mitral valve is grossly normal. Mild to moderate mitral valve  regurgitation.  4. Tricuspid valve regurgitation is moderate.  5. The aortic valve is tricuspid. Aortic valve regurgitation is trivial.  No aortic stenosis is present.  6. The inferior vena cava is normal in size with greater than 50%  respiratory variability, suggesting right atrial pressure of 3 mmHg.    Assessment and Plan  1.PAF - confirmed diagnosis by recent monitor, perhaps the etiology of her recent retinal ischemia - eliquis and xarelto were too expensive, she is on coumadin - no symptoms, continue current meds  2. Valvular heart disease - mild to mod MR and mod TR - no symptoms, continue to monitor  3. HTN - above goal, start chlorthlalidone 12.5mg  daily, bmet/mg in 2 weeks - she will call with home bp's in 2 weeks   F/u 6 months  Arnoldo Lenis, M.D.

## 2020-10-21 ENCOUNTER — Telehealth: Payer: Self-pay | Admitting: *Deleted

## 2020-10-21 DIAGNOSIS — I10 Essential (primary) hypertension: Secondary | ICD-10-CM

## 2020-10-21 NOTE — Telephone Encounter (Signed)
BP readings per 4/28 office appt   4/29 BP 142/75 HR 65 4/30 BP 128/75 HR 72 5/1 BP 144/82 HR 72 5/2 BP 134/79 HR 82 5/3 BP 115/72 HR 71 5/4 BP 119/68 HR 76 5/5 BP 152/82 HR 71 5/6 BP 118/63 HR 65 5/7 BP 124/71 HR 73 5/8 BP 127/76 HR 98 5/9 BP 151/74 HR 65 5/10 BP 134/80 HR 64 5/11 BP 108/66 HR 67 5/12 BP 128/69 HR 69

## 2020-10-21 NOTE — Telephone Encounter (Signed)
Pt voiced understanding and will mail lab orders to pt as requested (will have labs done at [facility])

## 2020-10-21 NOTE — Telephone Encounter (Signed)
-----   Message from Arnoldo Lenis, MD sent at 10/21/2020  7:31 AM EDT ----- Labs look good, no changes. Can she do another bmet/mg in 6 weeks potassium is currently low normal  Desiree Abts MD

## 2020-10-22 NOTE — Telephone Encounter (Signed)
BP's overall look good, no changes   J Bain Whichard MD 

## 2020-10-22 NOTE — Telephone Encounter (Signed)
Pt aware.

## 2020-10-28 ENCOUNTER — Ambulatory Visit (INDEPENDENT_AMBULATORY_CARE_PROVIDER_SITE_OTHER): Payer: Medicare Other | Admitting: *Deleted

## 2020-10-28 DIAGNOSIS — Z5181 Encounter for therapeutic drug level monitoring: Secondary | ICD-10-CM

## 2020-10-28 DIAGNOSIS — I480 Paroxysmal atrial fibrillation: Secondary | ICD-10-CM | POA: Diagnosis not present

## 2020-10-28 LAB — POCT INR: INR: 2.6 (ref 2.0–3.0)

## 2020-10-28 NOTE — Patient Instructions (Signed)
Continue warfarin 1 tablet daily except 1/2 tablet on Mondays, Wednesdays and Fridays Recheck in 5 weeks

## 2020-12-02 ENCOUNTER — Ambulatory Visit (INDEPENDENT_AMBULATORY_CARE_PROVIDER_SITE_OTHER): Payer: Medicare Other | Admitting: *Deleted

## 2020-12-02 DIAGNOSIS — I480 Paroxysmal atrial fibrillation: Secondary | ICD-10-CM | POA: Diagnosis not present

## 2020-12-02 DIAGNOSIS — Z5181 Encounter for therapeutic drug level monitoring: Secondary | ICD-10-CM

## 2020-12-02 LAB — POCT INR: INR: 2.8 (ref 2.0–3.0)

## 2020-12-02 NOTE — Patient Instructions (Signed)
Continue warfarin 1 tablet daily except 1/2 tablet on Mondays, Wednesdays and Fridays Recheck in 6 weeks.  

## 2020-12-03 ENCOUNTER — Other Ambulatory Visit: Payer: Self-pay | Admitting: Cardiology

## 2020-12-03 DIAGNOSIS — I480 Paroxysmal atrial fibrillation: Secondary | ICD-10-CM

## 2020-12-11 NOTE — Telephone Encounter (Signed)
Pt records were shredded.

## 2021-01-08 ENCOUNTER — Other Ambulatory Visit: Payer: Self-pay

## 2021-01-08 ENCOUNTER — Ambulatory Visit (INDEPENDENT_AMBULATORY_CARE_PROVIDER_SITE_OTHER): Payer: Medicare Other | Admitting: Orthopaedic Surgery

## 2021-01-08 ENCOUNTER — Encounter: Payer: Self-pay | Admitting: Orthopaedic Surgery

## 2021-01-08 DIAGNOSIS — M47812 Spondylosis without myelopathy or radiculopathy, cervical region: Secondary | ICD-10-CM | POA: Diagnosis not present

## 2021-01-08 NOTE — Progress Notes (Signed)
Office Visit Note   Patient: Desiree Bowers           Date of Birth: [birth year]           MRN: [id number] Visit Date: 01/08/2021              Requested by: Loman Brooklyn, [place],  [place] [id number] PCP: Loman Brooklyn, FNP   Assessment & Plan: Visit Diagnoses:  1. Spondylosis without myelopathy or radiculopathy, cervical region     Plan: She is actually gotten better in the last week.  If she does not continue to improve she will call about starting some therapy.  Follow-Up Instructions: No follow-ups on file.   Orders:  No orders of the defined types were placed in this encounter.  No orders of the defined types were placed in this encounter.     Procedures: No procedures performed   Clinical Data: No additional findings.   Subjective: Chief Complaint  Patient presents with   Neck - Pain    HPI patient had severe onset of neck pain about 3 weeks ago she states is actually gotten better in the last week.  Bothers her mostly at night she is use Voltaren gel.  She is on Coumadin for atrial fibs that was started in June and does not take any oral anti-inflammatories.  Previous C-spine radiograph showed spondylosis with disc base narrowing most pronounced at C5-6 C6-7 and facet arthropathy noted on AP both right and left.  She denies significant radicular symptoms in her hands no myelopathic gait problems.  Review of Systems positive atrial fibs on current Coumadin.  Positive for hypertension prediabetes.  All systems noncontributory to HPI.   Objective: Vital Signs: Ht '5\' 5"'$  (1.651 m)   Wt 184 lb (83.5 kg)   BMI 30.62 kg/m   Physical Exam Constitutional:      Appearance: She is well-developed.  HENT:     Head: Normocephalic.     Right Ear: External ear normal.     Left Ear: External ear normal. There is no impacted cerumen.  Eyes:     Pupils: Pupils are equal, round, and reactive to light.  Neck:     Thyroid: No thyromegaly.      Trachea: No tracheal deviation.  Cardiovascular:     Rate and Rhythm: Rhythm irregular.  Pulmonary:     Effort: Pulmonary effort is normal.  Abdominal:     Palpations: Abdomen is soft.  Musculoskeletal:     Cervical back: No rigidity.  Skin:    General: Skin is warm and dry.  Neurological:     Mental Status: She is alert and oriented to person, place, and time.  Psychiatric:        Behavior: Behavior normal.    Ortho Exam patient has brachial plexus tenderness more on the right than left positive Spurling on the left.  Some relief with distraction mild increase with compression.  Reflexes are intact normal heel toe gait.  Specialty Comments:  No specialty comments available.  Imaging: No results found.   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[place]) 04/13/2016   Primary osteoarthritis involving multiple joints 11/15/2014   Acquired hypothyroidism 11/15/2014   Gastroesophageal reflux disease without esophagitis 04/04/2014   GAD (generalized anxiety disorder) 04/04/2014   ANA positive 01/18/2014   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   Essential (primary) hypertension 09/24/2013   Past Medical History:  Diagnosis Date   Cancer ([place])    breast    Coronary artery disease due to lipid rich plaque 04/13/2016   Diabetes mellitus without complication ([place])    Diverticulosis of intestine without bleeding 12/13/2016   History of right mastectomy 11/15/2014   Hypothyroidism    Overactive bladder    Paget's disease of the bone     Paroxysmal atrial fibrillation ([place]) 06/19/2014   Restless leg syndrome     Family History  Problem Relation Age of Onset   Breast cancer Mother    Colon cancer Father    Kidney cancer Father    Bladder Cancer Sister    Suicidality Brother    Heart attack Maternal Grandmother    Breast cancer Sister    Stomach cancer Sister    Ovarian cancer Sister    Breast cancer Sister    Breast cancer Sister    Diabetes Sister    Other Brother        parathyroid disorder; Engelmann's disease   Angelman syndrome Son    Berenice Primas' disease Daughter     Past Surgical History:  Procedure Laterality Date   ABDOMINAL HYSTERECTOMY  1999   APPENDECTOMY     as a child   CATARACT EXTRACTION Bilateral Fall 2013   Dr. Charise Killian   CATARACT EXTRACTION, BILATERAL     EYE SURGERY Bilateral Fall 2013   Cat Sx - Dr. Charise Killian   MASTECTOMY Right    Social History   Occupational History   Occupation: Retired  Tobacco Use   Smoking status: Former   Smokeless tobacco: Never  Scientific laboratory technician Use: Never used  Substance and Sexual Activity   Alcohol use: No   Drug use: No   Sexual activity: Not Currently

## 2021-01-13 ENCOUNTER — Other Ambulatory Visit: Payer: Self-pay

## 2021-01-13 ENCOUNTER — Ambulatory Visit (INDEPENDENT_AMBULATORY_CARE_PROVIDER_SITE_OTHER): Payer: Medicare Other | Admitting: *Deleted

## 2021-01-13 DIAGNOSIS — Z5181 Encounter for therapeutic drug level monitoring: Secondary | ICD-10-CM | POA: Diagnosis not present

## 2021-01-13 DIAGNOSIS — I480 Paroxysmal atrial fibrillation: Secondary | ICD-10-CM

## 2021-01-13 LAB — POCT INR: INR: 2.1 (ref 2.0–3.0)

## 2021-01-13 NOTE — Patient Instructions (Signed)
Continue warfarin 1 tablet daily except 1/2 tablet on Mondays, Wednesdays and Fridays Recheck in 6 weeks.  

## 2021-02-13 ENCOUNTER — Encounter (INDEPENDENT_AMBULATORY_CARE_PROVIDER_SITE_OTHER): Payer: Medicare Other | Admitting: Ophthalmology

## 2021-02-23 ENCOUNTER — Other Ambulatory Visit: Payer: Self-pay

## 2021-02-23 ENCOUNTER — Ambulatory Visit (INDEPENDENT_AMBULATORY_CARE_PROVIDER_SITE_OTHER): Payer: Medicare Other | Admitting: *Deleted

## 2021-02-23 DIAGNOSIS — Z5181 Encounter for therapeutic drug level monitoring: Secondary | ICD-10-CM | POA: Diagnosis not present

## 2021-02-23 DIAGNOSIS — I480 Paroxysmal atrial fibrillation: Secondary | ICD-10-CM

## 2021-02-23 LAB — POCT INR: INR: 2.1 (ref 2.0–3.0)

## 2021-02-23 NOTE — Patient Instructions (Signed)
Continue warfarin 1 tablet daily except 1/2 tablet on Mondays, Wednesdays and Fridays Recheck in 6 weeks.  

## 2021-03-11 NOTE — Progress Notes (Signed)
[facility] Note  03/13/2021     CHIEF COMPLAINT Patient presents for Retina Follow Up   HISTORY OF PRESENT ILLNESS: Desiree Bowers is a 75 y.o. female who presents to the clinic today for:   HPI     Retina Follow Up   Patient presents with  Other.  In left eye.  Duration of 10 months.  Since onset it is stable.  I, the attending physician,  performed the HPI with the patient and updated documentation appropriately.        Comments   Pt states vision is the same, no fol or floaters, Systane drops as needed      Last edited by [name], MD on 03/16/2021  1:01 AM.    pt states her vision is blurry occasionally upon standing, but no more vision black outs, she is still on 5mg  Warfarin   Referring physician: Medicine, [facility] [place],  [place] [address]  HISTORICAL INFORMATION:   Selected notes from the MEDICAL RECORD NUMBER Referred by Dr. Parke Simmers for retina eval, concern for RAO OS   CURRENT MEDICATIONS: No current outpatient medications on file. (Ophthalmic Drugs)   No current facility-administered medications for this visit. (Ophthalmic Drugs)   Current Outpatient Medications (Other)  Medication Sig   atorvastatin (LIPITOR) 10 MG tablet Take 1 tablet (10 mg total) by mouth every evening.   calcium carbonate (OSCAL) 1500 (600 Ca) MG TABS tablet Take 2 tablets by mouth daily.    chlorthalidone (HYGROTON) 25 MG tablet Take 0.5 tablets (12.5 mg total) by mouth daily.   famotidine (PEPCID) 20 MG tablet Take 1 tablet (20 mg total) by mouth daily.   furosemide (LASIX) 20 MG tablet Take 20 mg by mouth daily as needed for edema.    loratadine (CLARITIN) 10 MG tablet Take 10 mg by mouth daily.   magnesium oxide (MAG-OX) 400 (241.3 Mg) MG tablet Take 400 mg by mouth daily.    meloxicam (MOBIC) 7.5 MG tablet Take 1 tablet (7.5 mg total) by mouth daily.   metoprolol tartrate (LOPRESSOR) 25 MG tablet TAKE 1 AND 1/2 TABLETS BY MOUTH 2  TIMES DAILY   oxybutynin (DITROPAN-XL) 10 MG 24 hr tablet TAKE 1 TABLET EVERY DAY   pantoprazole (PROTONIX) 40 MG tablet Take 40 mg by mouth daily.   sertraline (ZOLOFT) 25 MG tablet TAKE 1 AND 1/2 TABLETS EVERY DAY   SYNTHROID 112 MCG tablet Take 112 mcg by mouth daily before breakfast.    warfarin (COUMADIN) 5 MG tablet TAKE 1 TABLET (5 MG TOTAL) BY MOUTH DAILY. *MUST BE AMNEAL MANUFACTURER   No current facility-administered medications for this visit. (Other)      REVIEW OF SYSTEMS:     ALLERGIES Allergies  Allergen Reactions   Mirapex [Pramipexole Dihydrochloride] Itching and Swelling   Penicillins Rash   Atorvastatin Other (See Comments)   Fosamax [Alendronate] Other (See Comments)   Gabapentin Other (See Comments)    Couldn't move legs during the night after taking.   Levothyroxine Itching   Symbicort [Budesonide-Formoterol Fumarate] Itching    PAST MEDICAL HISTORY Past Medical History:  Diagnosis Date   Cancer [facility])    breast    Coronary artery disease due to lipid rich plaque 04/13/2016   Diabetes mellitus without complication ([place])    Diverticulosis of intestine without bleeding 12/13/2016   History of right mastectomy 11/15/2014   Hypothyroidism    Overactive bladder    Paget's disease of  the bone    Paroxysmal atrial fibrillation ([place]) 06/19/2014   Restless leg syndrome    Past Surgical History:  Procedure Laterality Date   ABDOMINAL HYSTERECTOMY  1999   APPENDECTOMY     as a child   CATARACT EXTRACTION Bilateral Fall 2013   Dr. Charise Killian   CATARACT EXTRACTION, [place] Bilateral Fall 2013   Cat Sx - Dr. Charise Killian   MASTECTOMY Right     FAMILY HISTORY Family History  Problem Relation Age of Onset   Breast cancer Mother    Colon cancer Father    Kidney cancer Father    Bladder Cancer Sister    Suicidality Brother    Heart attack Maternal Grandmother    Breast cancer Sister    Stomach cancer Sister    Ovarian cancer Sister    Breast  cancer Sister    Breast cancer Sister    Diabetes Sister    Other Brother        parathyroid disorder; Engelmann's disease   Angelman syndrome Son    Graves' disease Daughter     SOCIAL HISTORY Social History   Tobacco Use   Smoking status: Former   Smokeless tobacco: Never  Scientific laboratory technician Use: Never used  Substance Use Topics   Alcohol use: No   Drug use: No         OPHTHALMIC EXAM:  Base Eye Exam     Visual Acuity (Snellen - Linear)       Right Left   Dist [place] 20/20 -2 20/25 -2   Dist ph [place]  NI         Tonometry (Tonopen, 1:15 PM)       Right Left   Pressure 5 8         Tonometry #2 (Tonopen, 1:15 PM)       Right Left   Pressure 13          Pupils       Dark Light Shape React APD   Right 3 2 Round Brisk None   Left 3 2 Round Brisk None         Visual Fields     Counting fingers         Extraocular Movement       Right Left    Full, Ortho Full, Ortho         Neuro/Psych     Oriented x3: Yes   Mood/Affect: Normal         Dilation     Both eyes: 1.0% Mydriacyl, 2.5% Phenylephrine @ 1:15 PM           Slit Lamp and Fundus Exam     Slit Lamp Exam       Right Left   Lids/Lashes Dermatochalasis - upper lid Dermatochalasis - upper lid   Conjunctiva/Sclera White and quiet, heavy pigmentation superior hemisphere White and quiet   Cornea Arcus, temporal cataract wounds, 2+ Punctate epithelial erosions, tear film debris Arcus, well healed temporal cataract wounds, 2+ Punctate epithelial erosions, nasal LRI, decreased TBUT, tear film debris   Anterior Chamber Deep and quiet Deep and quiet   Iris Round and dilated, No NVI Round and dilated, No NVI   Lens PC IOL in good position PC IOL in good position   Vitreous Vitreous syneresis Vitreous syneresis         Fundus Exam       Right Left   Disc mild  Pallor, Sharp rim mild Pallor, Sharp rim, Compact   C/D Ratio 0.5 0.2   Macula Flat, Good foveal reflex, mild  Epiretinal membrane, mild Retinal pigment epithelial mottling, No heme or edema Flat, Blunted foveal reflex, mild Retinal pigment epithelial mottling, rare, fine drusen, No heme or edema   Vessels attenuated, mild tortuousity attenuated, Tortuous, +Copper wiring   Periphery Attached, mild peripheral drusen, No heme  Attached, scattered peripheral drusen, focal, patchy RPE changes nasal periphery, No heme             IMAGING AND PROCEDURES  Imaging and Procedures for @TODAY @  OCT, Retina - OU - Both Eyes       Right Eye Quality was good. Central Foveal Thickness: 281. Progression has worsened. Findings include normal foveal contour, no SRF, no IRF, epiretinal membrane (Interval development of mind ERM, Rare drusen and peripheral ORA).   Left Eye Quality was good. Central Foveal Thickness: 270. Progression has been stable. Findings include normal foveal contour, no IRF, no SRF, retinal drusen (Rare drusen, irregular RPE contour -- slightly improved).   Notes *Images captured and stored on drive  Diagnosis / Impression:  NFP, no IRF/SRF OU OD: Interval development of mild ERM, Rare drusen and peripheral ORA OS: Rare drusen, irregular RPE contour -- improved from prior  Clinical management:  See below  Abbreviations: NFP - Normal foveal profile. CME - cystoid macular edema. PED - pigment epithelial detachment. IRF - intraretinal fluid. SRF - subretinal fluid. EZ - ellipsoid zone. ERM - epiretinal membrane. ORA - outer retinal atrophy. ORT - outer retinal tubulation. SRHM - subretinal hyper-reflective material            ASSESSMENT/PLAN:    ICD-10-CM   1. Retinal ischemia  H35.82     2. Retinal edema  H35.81 OCT, Retina - OU - Both Eyes    3. Essential hypertension  I10     4. Hypertensive retinopathy of both eyes  H35.033     5. Pseudophakia of both eyes  Z96.1       1,2. Ocular/retinal ischemia OS  - pt has history of near syncopal episodes -- notably hypotensive  during episodes in early 2021  - pt awoke one morning prior to initial consult with decreased vision OS  - pt presented to [facility] ED and had MRI brain to r/o stroke on 3.24 -- no recent infarction, hemorrhage or mass -- just microvascular ischemic changes  - BCVA today 20/25  - exam with no significant pathologic findings   - OCT without IRF/SRF but RPE contour quite irregular  - FA 4.14.21 shows delayed filling time  - repeat FA 12.9.21 with improved filling time  - suspect near syncopal/hypotensive episodes + visual episodes may have vascular etiology  - MRI was negative on 3.24.21  - pt had event monitor placed -- showed signs of PAF --> now on warfarin  - pt underwent echo and carotid doppler on 05.26.21, both were normal  - pt has had stable vision without episodes of decreased vision  - f/u here 9-12 months, DFE, OCT, FA (transit OS)  3,4. Hypertensive retinopathy OU  - discussed importance of tight BP control  - monitor   5. Pseudophakia OU  - s/p CE/IOL (2013, Dr. Dolores Lory)  - beautiful surgery, doing well  - monitor   Ophthalmic Meds Ordered this visit:  No orders of the defined types were placed in this encounter.    Return for f/u 9-12 months, ocular ischemia OS, DFE, OCT,  FA.  There are no Patient Instructions on file for this visit.   Explained the diagnoses, plan, and follow up with the patient and they expressed understanding.  Patient expressed understanding of the importance of proper follow up care.   This document serves as a record of services personally performed by Gardiner Sleeper, MD, PhD. It was created on their behalf by Leonie Douglas, an ophthalmic technician. The creation of this record is the provider's dictation and/or activities during the visit.    Electronically signed by: Leonie Douglas COA, 03/16/21  1:07 AM   This document serves as a record of services personally performed by Gardiner Sleeper, MD, PhD. It was created on their behalf by San Jetty.  Owens Shark, OA an ophthalmic technician. The creation of this record is the provider's dictation and/or activities during the visit.    Electronically signed by: San Jetty. Owens Shark, [place] 10.07.2022 1:07 AM  Gardiner Sleeper, M.D., Ph.D. Diseases & Surgery of the Retina and [place] 03/13/2021  I have reviewed the above documentation for accuracy and completeness, and I agree with the above. Gardiner Sleeper, M.D., Ph.D. 03/16/21 1:07 AM  Abbreviations: M myopia (nearsighted); A astigmatism; H hyperopia (farsighted); P presbyopia; Mrx spectacle prescription;  CTL contact lenses; OD right eye; OS left eye; OU both eyes  XT exotropia; ET esotropia; PEK punctate epithelial keratitis; PEE punctate epithelial erosions; DES dry eye syndrome; MGD meibomian gland dysfunction; ATs artificial tears; PFAT's preservative free artificial tears; [place] nuclear sclerotic cataract; PSC posterior subcapsular cataract; ERM epi-retinal membrane; PVD posterior vitreous detachment; RD retinal detachment; DM diabetes mellitus; DR diabetic retinopathy; NPDR non-proliferative diabetic retinopathy; PDR proliferative diabetic retinopathy; CSME clinically significant macular edema; DME diabetic macular edema; dbh dot blot hemorrhages; CWS cotton wool spot; POAG primary open angle glaucoma; C/D cup-to-disc ratio; HVF humphrey visual field; GVF goldmann visual field; OCT optical coherence tomography; IOP intraocular pressure; BRVO Branch retinal vein occlusion; CRVO central retinal vein occlusion; CRAO central retinal artery occlusion; BRAO branch retinal artery occlusion; RT retinal tear; SB scleral buckle; PPV pars plana vitrectomy; VH Vitreous hemorrhage; PRP panretinal laser photocoagulation; IVK intravitreal kenalog; VMT vitreomacular traction; MH Macular hole;  NVD neovascularization of the disc; NVE neovascularization elsewhere; AREDS age related eye disease study; ARMD age related macular degeneration;  POAG primary open angle glaucoma; EBMD epithelial/anterior basement membrane dystrophy; ACIOL anterior chamber intraocular lens; IOL intraocular lens; PCIOL posterior chamber intraocular lens; Phaco/IOL phacoemulsification with intraocular lens placement; [place] photorefractive keratectomy; LASIK laser assisted in situ keratomileusis; HTN hypertension; DM diabetes mellitus; COPD chronic obstructive pulmonary disease

## 2021-03-13 ENCOUNTER — Encounter (INDEPENDENT_AMBULATORY_CARE_PROVIDER_SITE_OTHER): Payer: Self-pay | Admitting: Ophthalmology

## 2021-03-13 ENCOUNTER — Other Ambulatory Visit: Payer: Self-pay

## 2021-03-13 ENCOUNTER — Ambulatory Visit (INDEPENDENT_AMBULATORY_CARE_PROVIDER_SITE_OTHER): Payer: Medicare Other | Admitting: Ophthalmology

## 2021-03-13 ENCOUNTER — Encounter (INDEPENDENT_AMBULATORY_CARE_PROVIDER_SITE_OTHER): Payer: Medicare Other | Admitting: Ophthalmology

## 2021-03-13 DIAGNOSIS — H35033 Hypertensive retinopathy, bilateral: Secondary | ICD-10-CM | POA: Diagnosis not present

## 2021-03-13 DIAGNOSIS — H3582 Retinal ischemia: Secondary | ICD-10-CM

## 2021-03-13 DIAGNOSIS — Z961 Presence of intraocular lens: Secondary | ICD-10-CM | POA: Diagnosis not present

## 2021-03-13 DIAGNOSIS — I10 Essential (primary) hypertension: Secondary | ICD-10-CM

## 2021-03-13 DIAGNOSIS — H3581 Retinal edema: Secondary | ICD-10-CM

## 2021-03-13 LAB — HM DIABETES EYE EXAM

## 2021-03-16 ENCOUNTER — Encounter: Payer: Self-pay | Admitting: Family Medicine

## 2021-03-16 ENCOUNTER — Encounter (INDEPENDENT_AMBULATORY_CARE_PROVIDER_SITE_OTHER): Payer: Self-pay | Admitting: Ophthalmology

## 2021-04-06 ENCOUNTER — Ambulatory Visit: Payer: Medicare Other | Admitting: Cardiology

## 2021-04-08 ENCOUNTER — Encounter: Payer: Self-pay | Admitting: Cardiology

## 2021-04-08 ENCOUNTER — Ambulatory Visit (INDEPENDENT_AMBULATORY_CARE_PROVIDER_SITE_OTHER): Payer: Medicare Other | Admitting: Cardiology

## 2021-04-08 ENCOUNTER — Ambulatory Visit (INDEPENDENT_AMBULATORY_CARE_PROVIDER_SITE_OTHER): Payer: Medicare Other | Admitting: *Deleted

## 2021-04-08 ENCOUNTER — Encounter: Payer: Self-pay | Admitting: *Deleted

## 2021-04-08 VITALS — BP 126/66 | HR 62 | Ht 65.0 in | Wt 186.8 lb

## 2021-04-08 DIAGNOSIS — I480 Paroxysmal atrial fibrillation: Secondary | ICD-10-CM

## 2021-04-08 DIAGNOSIS — I38 Endocarditis, valve unspecified: Secondary | ICD-10-CM

## 2021-04-08 DIAGNOSIS — I10 Essential (primary) hypertension: Secondary | ICD-10-CM

## 2021-04-08 DIAGNOSIS — Z5181 Encounter for therapeutic drug level monitoring: Secondary | ICD-10-CM

## 2021-04-08 LAB — POCT INR: INR: 3.7 — AB (ref 2.0–3.0)

## 2021-04-08 NOTE — Patient Instructions (Signed)

## 2021-04-08 NOTE — Progress Notes (Signed)
Clinical Summary Ms. Fawaz is a 75 y.o.female seen today for follow up of the following medical problems.        1. PAF -08/2019 monitor showed episodes of PAF   - infrequent palpitations - no issues on coumadin     2. Vision changes - seen in ER 08/29/19 with acute vision change - MRI without acute findings - seen by optho, diagnosed with retinal ischemia.  - optho has asked for echo and carotid dopplers.  - may have been related to her PAF which just diagnosed by monitor.      3. Valvular heart disease - 02/2019 echo with mild to mod MR, mod TR,  - denies symptoms.   10/2019 echo LVEF 55-60%, mild to mod MR, mod TR   - no SOB/DOE, no LE edema.    4. HTN  - had some leg swelling on amlodopine per her report - reports cough on losartan per her report - compliant with meds   Past Medical History:  Diagnosis Date   Cancer ([place])    breast    Coronary artery disease due to lipid rich plaque 04/13/2016   Diabetes mellitus without complication ([place])    Diverticulosis of intestine without bleeding 12/13/2016   History of right mastectomy 11/15/2014   Hypothyroidism    Overactive bladder    Paget's disease of the bone    Paroxysmal atrial fibrillation ([facility]) 06/19/2014   Restless leg syndrome      Allergies  Allergen Reactions   Mirapex [Pramipexole Dihydrochloride] Itching and Swelling   Penicillins Rash   Atorvastatin Other (See Comments)   Fosamax [Alendronate] Other (See Comments)   Gabapentin Other (See Comments)    Couldn't move legs during the night after taking.   Levothyroxine Itching   Symbicort [Budesonide-Formoterol Fumarate] Itching     Current Outpatient Medications  Medication Sig Dispense Refill   atorvastatin (LIPITOR) 10 MG tablet Take 1 tablet (10 mg total) by mouth every evening. 90 tablet 1   calcium carbonate (OSCAL) 1500 (600 Ca) MG TABS tablet Take 2 tablets by mouth daily.      chlorthalidone (HYGROTON) 25 MG tablet Take 0.5 tablets  (12.5 mg total) by mouth daily. 45 tablet 1   famotidine (PEPCID) 20 MG tablet Take 1 tablet (20 mg total) by mouth daily. 60 tablet 0   furosemide (LASIX) 20 MG tablet Take 20 mg by mouth daily as needed for edema.      loratadine (CLARITIN) 10 MG tablet Take 10 mg by mouth daily.     magnesium oxide (MAG-OX) 400 (241.3 Mg) MG tablet Take 400 mg by mouth daily.      meloxicam (MOBIC) 7.5 MG tablet Take 1 tablet (7.5 mg total) by mouth daily. 60 tablet 0   metoprolol tartrate (LOPRESSOR) 25 MG tablet TAKE 1 AND 1/2 TABLETS BY MOUTH 2 TIMES DAILY 270 tablet 3   oxybutynin (DITROPAN-XL) 10 MG 24 hr tablet TAKE 1 TABLET EVERY DAY 90 tablet 1   pantoprazole (PROTONIX) 40 MG tablet Take 40 mg by mouth daily.     sertraline (ZOLOFT) 25 MG tablet TAKE 1 AND 1/2 TABLETS EVERY DAY 135 tablet 1   SYNTHROID 112 MCG tablet Take 112 mcg by mouth daily before breakfast.      warfarin (COUMADIN) 5 MG tablet TAKE 1 TABLET (5 MG TOTAL) BY MOUTH DAILY. *MUST BE AMNEAL MANUFACTURER 90 tablet 2   No current facility-administered medications for this visit.     Past Surgical  History:  Procedure Laterality Date   ABDOMINAL HYSTERECTOMY  1999   APPENDECTOMY     as a child   CATARACT EXTRACTION Bilateral Fall 2013   Dr. Charise Killian   CATARACT EXTRACTION, BILATERAL     EYE SURGERY Bilateral Fall 2013   Cat Sx - Dr. Charise Killian   MASTECTOMY Right      Allergies  Allergen Reactions   Mirapex [Pramipexole Dihydrochloride] Itching and Swelling   Penicillins Rash   Atorvastatin Other (See Comments)   Fosamax [Alendronate] Other (See Comments)   Gabapentin Other (See Comments)    Couldn't move legs during the night after taking.   Levothyroxine Itching   Symbicort [Budesonide-Formoterol Fumarate] Itching      Family History  Problem Relation Age of Onset   Breast cancer Mother    Colon cancer Father    Kidney cancer Father    Bladder Cancer Sister    Suicidality Brother    Heart attack Maternal Grandmother     Breast cancer Sister    Stomach cancer Sister    Ovarian cancer Sister    Breast cancer Sister    Breast cancer Sister    Diabetes Sister    Other Brother        parathyroid disorder; Engelmann's disease   Angelman syndrome Son    Berenice Primas' disease Daughter      Social History Ms. Burtis reports that she has quit smoking. She has never used smokeless tobacco. Ms. Gatliff reports no history of alcohol use.   Review of Systems CONSTITUTIONAL: No weight loss, fever, chills, weakness or fatigue.  HEENT: Eyes: No visual loss, blurred vision, double vision or yellow sclerae.No hearing loss, sneezing, congestion, runny nose or sore throat.  SKIN: No rash or itching.  CARDIOVASCULAR: per hpi RESPIRATORY: No shortness of breath, cough or sputum.  GASTROINTESTINAL: No anorexia, nausea, vomiting or diarrhea. No abdominal pain or blood.  GENITOURINARY: No burning on urination, no polyuria NEUROLOGICAL: No headache, dizziness, syncope, paralysis, ataxia, numbness or tingling in the extremities. No change in bowel or bladder control.  MUSCULOSKELETAL: No muscle, back pain, joint pain or stiffness.  LYMPHATICS: No enlarged nodes. No history of splenectomy.  PSYCHIATRIC: No history of depression or anxiety.  ENDOCRINOLOGIC: No reports of sweating, cold or heat intolerance. No polyuria or polydipsia.  Marland Kitchen   Physical Examination Today's Vitals   04/08/21 1002  BP: 126/66  Pulse: 62  SpO2: 99%  Weight: 186 lb 12.8 oz (84.7 kg)  Height: 5\' 5"  (1.651 m)   Body mass index is 31.09 kg/m.  Gen: resting comfortably, no acute distress HEENT: no scleral icterus, pupils equal round and reactive, no palptable cervical adenopathy,  CV: RRR, no m/r/g, no jvd Resp: Clear to auscultation bilaterally GI: abdomen is soft, non-tender, non-distended, normal bowel sounds, no hepatosplenomegaly MSK: extremities are warm, no edema.  Skin: warm, no rash Neuro:  no focal deficits Psych: appropriate  affect   Diagnostic Studies  08/2015 cath [facility] FINDINGS:  Coronary Angiography 1. Left Main - Normal 2. Left anterior descending artery - the LAD gives off a small first diagonal and a large second diagonal, normal. 3. Diagonals - Normal 4. Left Circumflex - the circumflex gives off a large OM1, small OM 2, and moderately large OM 3, normal. 5. Obtuse Marginals - Normal 6. Right Coronary Artery - the RCA gives off the PDA and small PLV branches, normal.  There is a conus Lukas Pelcher that originates on the aorta, dual lumen with the RCA. 7. Posterior  Descending Artery - Normal  Additional comments on angiography: Right Dominance   1.  Normal coronary arteries. 2.  LVEDP 20 3.  RRA access.  There was spasm of the radial artery near the level of the elbow so access was changed from [place] to right femoral artery.  RFA was closed with Perclose vascular suture device. 4. No Complications.   RECOMMENDATIONS:  1. Continue workup for etiology of current symptoms. 2. Followup with Dr. Hamilton Capri for with her PCP. 3. Continue risk factor modification   10/2019 echo IMPRESSIONS     1. Left ventricular ejection fraction, by estimation, is 55 to 60%. The  left ventricle has normal function. The left ventricle has no regional  wall motion abnormalities. There is mild left ventricular hypertrophy of  the basal-septal segment. Left  ventricular diastolic parameters are consistent with Grade I diastolic  dysfunction (impaired relaxation).   2. Right ventricular systolic function is low normal. The right  ventricular size is normal. There is normal pulmonary artery systolic  pressure.   3. The mitral valve is grossly normal. Mild to moderate mitral valve  regurgitation.   4. Tricuspid valve regurgitation is moderate.   5. The aortic valve is tricuspid. Aortic valve regurgitation is trivial.  No aortic stenosis is present.   6. The inferior vena cava is normal in size with greater than 50%   respiratory variability, suggesting right atrial pressure of 3 mmHg.      Assessment and Plan   1.PAF - confirmed diagnosis by monitor, perhaps the etiology of her retinal ischemia - eliquis and xarelto were too expensive, she is on coumadin - infrequent palpitatins, continue current regimen.    2. Valvular heart disease - mild to mod MR and mod TR - no symptoms, repeat echo likely next year   3. HTN -at goal,continue current meds     Arnoldo Lenis, M.D.

## 2021-04-08 NOTE — Patient Instructions (Signed)
Hold warfarin tomorrow then resume 1 tablet daily except 1/2 tablet on Mondays, Wednesdays and Fridays Took warfarin this morning Get back on salads/greens Recheck in 3 weeks

## 2021-04-20 ENCOUNTER — Other Ambulatory Visit: Payer: Self-pay | Admitting: Cardiology

## 2021-04-29 ENCOUNTER — Ambulatory Visit (INDEPENDENT_AMBULATORY_CARE_PROVIDER_SITE_OTHER): Payer: Medicare Other | Admitting: *Deleted

## 2021-04-29 DIAGNOSIS — I480 Paroxysmal atrial fibrillation: Secondary | ICD-10-CM

## 2021-04-29 DIAGNOSIS — Z5181 Encounter for therapeutic drug level monitoring: Secondary | ICD-10-CM

## 2021-04-29 LAB — POCT INR: INR: 3 (ref 2.0–3.0)

## 2021-04-29 NOTE — Patient Instructions (Signed)
Description   Continue taking 1 tablet daily except 1/2 tablet on Mondays, Wednesdays and Fridays Get back on salads/greens Recheck in 4 weeks

## 2021-05-08 ENCOUNTER — Telehealth (INDEPENDENT_AMBULATORY_CARE_PROVIDER_SITE_OTHER): Payer: Self-pay

## 2021-05-27 ENCOUNTER — Ambulatory Visit (INDEPENDENT_AMBULATORY_CARE_PROVIDER_SITE_OTHER): Payer: Medicare Other | Admitting: *Deleted

## 2021-05-27 DIAGNOSIS — Z5181 Encounter for therapeutic drug level monitoring: Secondary | ICD-10-CM

## 2021-05-27 DIAGNOSIS — I480 Paroxysmal atrial fibrillation: Secondary | ICD-10-CM

## 2021-05-27 LAB — POCT INR: INR: 3.2 — AB (ref 2.0–3.0)

## 2021-05-27 NOTE — Patient Instructions (Signed)
Decrease warfarin to 1/2 tablet daily except 1 tablet on Sundays, Tuesdays and Thursdays Continue to eat 1 serving of greens/week.  Recheck in 4 weeks

## 2021-06-23 ENCOUNTER — Telehealth: Payer: Self-pay | Admitting: Cardiology

## 2021-06-23 NOTE — Telephone Encounter (Signed)
I agree, on medication long term typically would not suddenly cause itching if tolerated that previous amount of time   J Danny Yackley MD

## 2021-06-23 NOTE — Telephone Encounter (Signed)
Advised that since she's been on metoprolol for greater than 1 year, its less likely the metoprolol causing her itching. Reports she itch mainly at night time. Advised to contact her PCP for other conditions that may be causing her to itch.

## 2021-06-23 NOTE — Telephone Encounter (Signed)
Pt c/o medication issue:  1. Name of Medication: metoprolol tartrate (LOPRESSOR) 25 MG tablet  2. How are you currently taking this medication (dosage and times per day)? TAKE 1 AND 1/2 TABLETS BY MOUTH 2 TIMES DAILY  3. Are you having a reaction (difficulty breathing--STAT)? no  4. What is your medication issue? Patient is having a reaction to the medication. Where she is itching real bad. Calling to see what's her other option

## 2021-06-24 ENCOUNTER — Ambulatory Visit (INDEPENDENT_AMBULATORY_CARE_PROVIDER_SITE_OTHER): Payer: Medicare Other | Admitting: *Deleted

## 2021-06-24 DIAGNOSIS — I480 Paroxysmal atrial fibrillation: Secondary | ICD-10-CM

## 2021-06-24 DIAGNOSIS — Z5181 Encounter for therapeutic drug level monitoring: Secondary | ICD-10-CM | POA: Diagnosis not present

## 2021-06-24 LAB — POCT INR: INR: 1.5 — AB (ref 2.0–3.0)

## 2021-06-24 NOTE — Patient Instructions (Signed)
Take warfarin 1 1/2 tablets tonight then resume 1/2 tablet daily except 1 tablet on Sundays, Tuesdays and Thursdays Continue to eat 1 serving of greens/week.  Recheck in 2 weeks

## 2021-07-08 ENCOUNTER — Ambulatory Visit (INDEPENDENT_AMBULATORY_CARE_PROVIDER_SITE_OTHER): Payer: Medicare Other | Admitting: *Deleted

## 2021-07-08 DIAGNOSIS — Z5181 Encounter for therapeutic drug level monitoring: Secondary | ICD-10-CM

## 2021-07-08 DIAGNOSIS — I480 Paroxysmal atrial fibrillation: Secondary | ICD-10-CM

## 2021-07-08 LAB — POCT INR: INR: 2.4 (ref 2.0–3.0)

## 2021-07-08 NOTE — Patient Instructions (Signed)
Continue warfarin 1/2 tablet daily except 1 tablet on Sundays, Tuesdays and Thursdays Continue to eat 1 serving of greens/week.  Recheck in 4 weeks 

## 2021-07-25 IMAGING — DX DG CHEST 1V PORT
1 series · 1 of 1 positions shown · non-contrast
Comparison: 07/10/2019

CLINICAL DATA: Dyspnea

EXAM:
PORTABLE CHEST 1 VIEW

[chest ap]
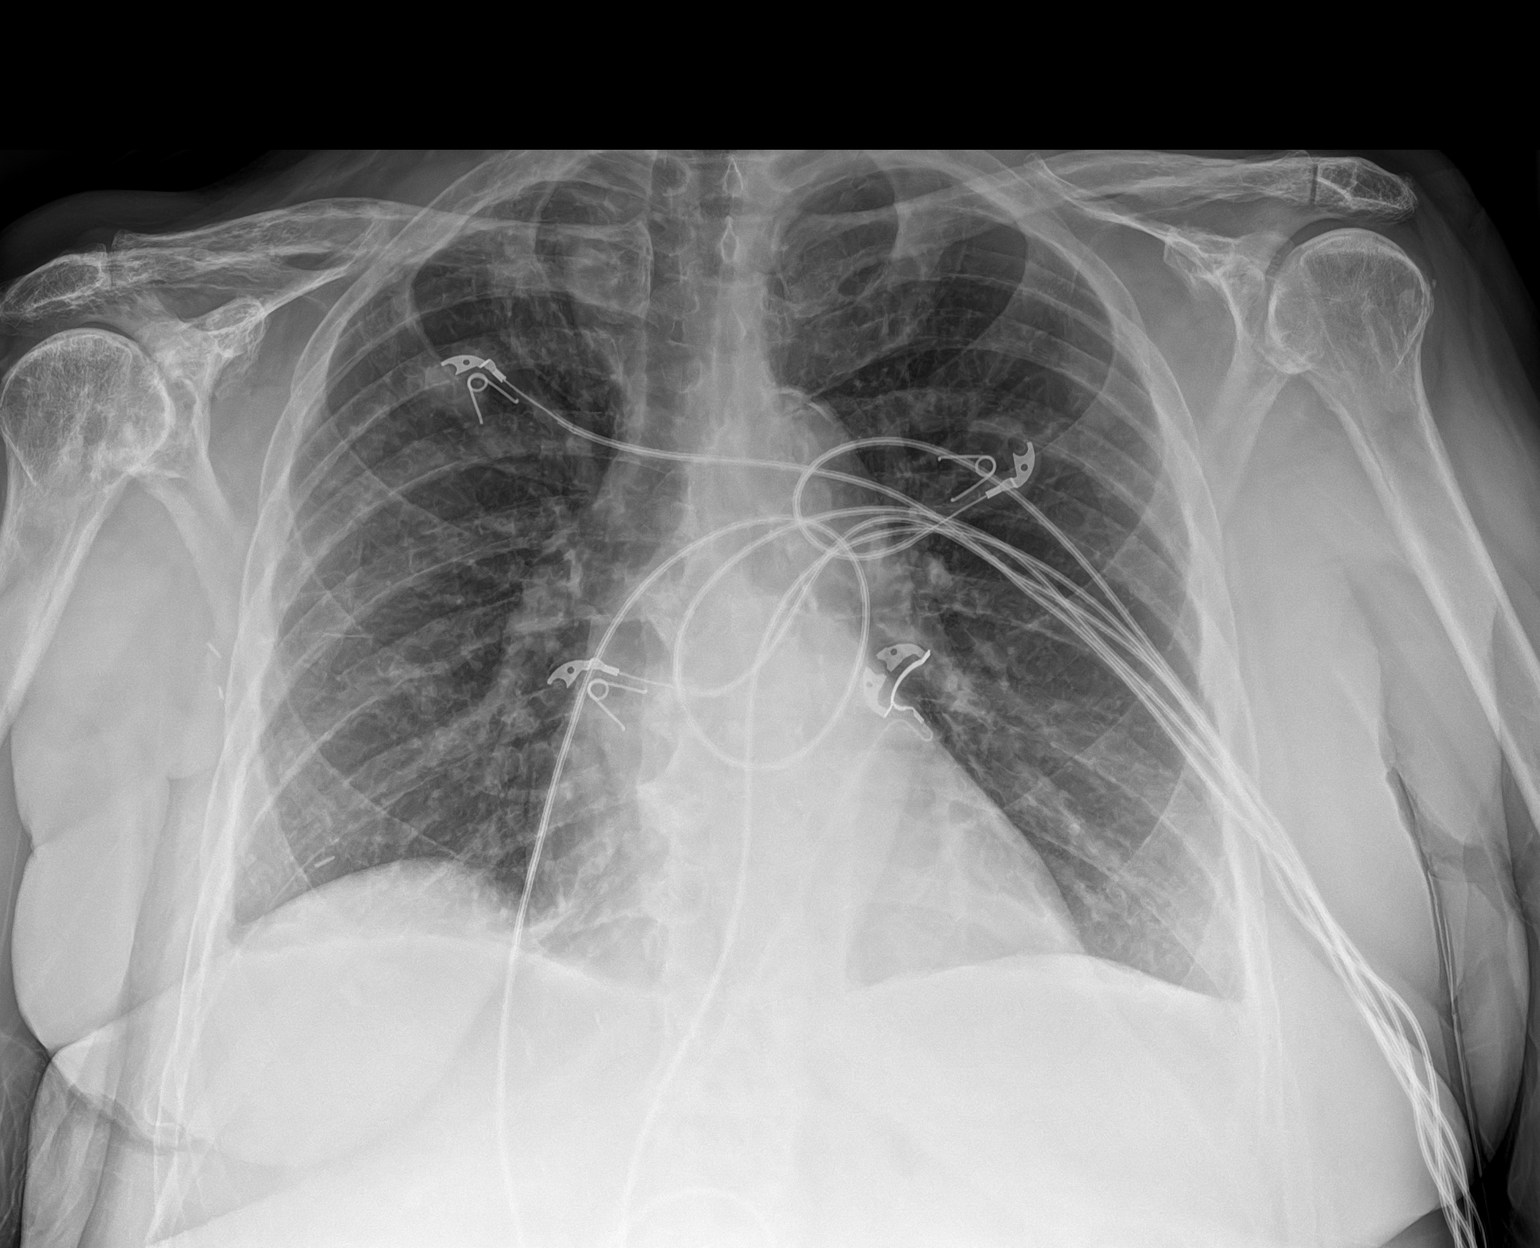

[1 of 1 positions shown; findings below may reference images not displayed]

FINDINGS: Tiny bilateral pleural effusions have developed, right greater than
left. No superimposed focal pulmonary infiltrate. No pneumothorax.
Cardiac size within normal limits. Pulmonary vascularity is normal.
Stable mixed lytic and sclerotic process involving the right humeral
head, right clavicle, and right scapula, better assessed on prior CT
examination of 07/10/2019 partial right mastectomy has been
performed.
IMPRESSION: Interval development of tiny bilateral pleural effusions. No
superimposed confluent pulmonary infiltrate.

## 2021-07-31 ENCOUNTER — Ambulatory Visit (INDEPENDENT_AMBULATORY_CARE_PROVIDER_SITE_OTHER): Payer: Medicare Other | Admitting: *Deleted

## 2021-07-31 ENCOUNTER — Other Ambulatory Visit: Payer: Self-pay

## 2021-07-31 DIAGNOSIS — I480 Paroxysmal atrial fibrillation: Secondary | ICD-10-CM

## 2021-07-31 DIAGNOSIS — Z5181 Encounter for therapeutic drug level monitoring: Secondary | ICD-10-CM | POA: Diagnosis not present

## 2021-07-31 LAB — POCT INR: INR: 2.4 (ref 2.0–3.0)

## 2021-07-31 NOTE — Patient Instructions (Signed)
Continue warfarin 1/2 tablet daily except 1 tablet on Sundays, Tuesdays and Thursdays Continue to eat 1 serving of greens/week.  Recheck in 4 weeks 

## 2021-08-11 ENCOUNTER — Other Ambulatory Visit: Payer: Self-pay | Admitting: Cardiology

## 2021-08-27 ENCOUNTER — Ambulatory Visit (INDEPENDENT_AMBULATORY_CARE_PROVIDER_SITE_OTHER): Payer: Medicare Other | Admitting: *Deleted

## 2021-08-27 DIAGNOSIS — I480 Paroxysmal atrial fibrillation: Secondary | ICD-10-CM

## 2021-08-27 DIAGNOSIS — Z5181 Encounter for therapeutic drug level monitoring: Secondary | ICD-10-CM | POA: Diagnosis not present

## 2021-08-27 LAB — POCT INR: INR: 2.4 (ref 2.0–3.0)

## 2021-08-27 NOTE — Patient Instructions (Signed)
Description   Continue warfarin 1/2 tablet daily except 1 tablet on Sundays, Tuesdays and Thursdays Continue to eat 1 serving of greens/week.  Recheck in 5 weeks     

## 2021-09-25 ENCOUNTER — Telehealth: Payer: Self-pay | Admitting: Cardiology

## 2021-09-25 NOTE — Telephone Encounter (Signed)
Pt c/o medication issue: ? ?1. Name of Medication: warfarin (COUMADIN) 5 MG tablet *MUST BE AMNEAL MANUFACTURER* ? ?2. How are you currently taking this medication (dosage and times per day)? As directed ? ?3. Are you having a reaction (difficulty breathing--STAT)? no ? ?4. What is your medication issue? Patient's last tablet is Sunday and her pharmacy, [facility], is out of the medication. She states that she called [facility] and [facility] and neither of them have the warfarin she takes. Patient is upset and would like to know what to do. Please advise.   ?

## 2021-09-25 NOTE — Telephone Encounter (Signed)
Routed to wrong triage, rerouting ?

## 2021-09-28 ENCOUNTER — Other Ambulatory Visit: Payer: Self-pay | Admitting: Cardiology

## 2021-09-28 ENCOUNTER — Telehealth: Payer: Self-pay | Admitting: Cardiology

## 2021-09-28 DIAGNOSIS — I480 Paroxysmal atrial fibrillation: Secondary | ICD-10-CM

## 2021-09-28 NOTE — Telephone Encounter (Signed)
Patient states her insurance will only cover "chain" pharmacies. ?

## 2021-09-28 NOTE — Telephone Encounter (Signed)
Pt c/o medication issue: ? ?1. Name of Medication: warfarin (COUMADIN) 5 MG tablet ? ?2. How are you currently taking this medication (dosage and times per day)?  ? TAKE 1 TABLET (5 MG TOTAL) BY MOUTH DAILY. 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  ? ? ?3. Are you having a reaction (difficulty breathing--STAT)? No ? ?4. What is your medication issue? Pt states that she is out of medication. Pt says that [facility] nor [facility] have medication in stock. This is pt's second time calling. Please advise ? ?

## 2021-09-28 NOTE — Telephone Encounter (Signed)
Pt put in request for medication at [facility] in [place] ?

## 2021-09-28 NOTE — Telephone Encounter (Signed)
Patient is authorized to try a different manufacturer if she can not find her preferred brand.  ? ?We are happy to send her Rx to any other pharmacy as needed. Suggest she try independent pharmacies if the chain pharmacies do not have it. ?

## 2021-09-28 NOTE — Telephone Encounter (Signed)
Then she will have to try whatever manufacturer her normal pharmacy provides.  We do not have any warfarin in the office.  She can also ask if they can order her preferred brand but they may not be able to ?

## 2021-10-01 ENCOUNTER — Ambulatory Visit (INDEPENDENT_AMBULATORY_CARE_PROVIDER_SITE_OTHER): Payer: Medicare Other | Admitting: *Deleted

## 2021-10-01 DIAGNOSIS — Z5181 Encounter for therapeutic drug level monitoring: Secondary | ICD-10-CM | POA: Diagnosis not present

## 2021-10-01 DIAGNOSIS — I480 Paroxysmal atrial fibrillation: Secondary | ICD-10-CM

## 2021-10-01 LAB — POCT INR: INR: 2.3 (ref 2.0–3.0)

## 2021-10-01 NOTE — Patient Instructions (Signed)
Continue warfarin 1/2 tablet daily except 1 tablet on Sundays, Tuesdays and Thursdays ?Continue to eat 1 serving of greens/week.  ?Recheck in 6 weeks ?

## 2021-10-09 ENCOUNTER — Ambulatory Visit: Payer: Medicare Other | Admitting: Cardiology

## 2021-11-10 ENCOUNTER — Ambulatory Visit (INDEPENDENT_AMBULATORY_CARE_PROVIDER_SITE_OTHER): Payer: Medicare Other | Admitting: *Deleted

## 2021-11-10 DIAGNOSIS — Z5181 Encounter for therapeutic drug level monitoring: Secondary | ICD-10-CM | POA: Diagnosis not present

## 2021-11-10 DIAGNOSIS — I480 Paroxysmal atrial fibrillation: Secondary | ICD-10-CM

## 2021-11-10 LAB — POCT INR: INR: 2.8 (ref 2.0–3.0)

## 2021-11-10 NOTE — Patient Instructions (Signed)
Continue warfarin 1/2 tablet daily except 1 tablet on Sundays, Tuesdays and Thursdays Continue to eat 1 serving of greens/week.  Recheck in 6 weeks

## 2021-11-17 ENCOUNTER — Ambulatory Visit (INDEPENDENT_AMBULATORY_CARE_PROVIDER_SITE_OTHER): Payer: Medicare Other | Admitting: Cardiology

## 2021-11-17 ENCOUNTER — Encounter: Payer: Self-pay | Admitting: Cardiology

## 2021-11-17 ENCOUNTER — Encounter: Payer: Self-pay | Admitting: *Deleted

## 2021-11-17 VITALS — BP 128/64 | HR 64 | Ht 65.0 in | Wt 188.8 lb

## 2021-11-17 DIAGNOSIS — I10 Essential (primary) hypertension: Secondary | ICD-10-CM | POA: Diagnosis not present

## 2021-11-17 DIAGNOSIS — I38 Endocarditis, valve unspecified: Secondary | ICD-10-CM

## 2021-11-17 DIAGNOSIS — I480 Paroxysmal atrial fibrillation: Secondary | ICD-10-CM | POA: Diagnosis not present

## 2021-11-17 MED ORDER — METOPROLOL TARTRATE 50 MG PO TABS: 50.0000 mg | ORAL_TABLET | Freq: Two times a day (BID) | ORAL | 1 refills | Status: DC

## 2021-11-17 MED ORDER — METOPROLOL TARTRATE 50 MG PO TABS: 50.0000 mg | ORAL_TABLET | Freq: Two times a day (BID) | ORAL | 6 refills | Status: DC

## 2021-11-17 NOTE — Progress Notes (Signed)
Clinical Summary Desiree Bowers is a 76 y.o.female seen today for follow up of the following medical problems.        1. PAF -08/2019 monitor showed episodes of PAF   - occasional feeling of heart pounding mainyl with exertion.  - no bleeding on coumadin     2. Vision changes - seen in ER 08/29/19 with acute vision change - MRI without acute findings - seen by optho, diagnosed with retinal ischemia.  - may have been related to her PAF which just diagnosed by monitor.      3. Valvular heart disease - 02/2019 echo with mild to mod MR, mod TR,  - denies symptoms.    10/2019 echo LVEF 55-60%, mild to mod MR, mod TR   - no LE edema. Some palpitations and SOB with activities.   4. HTN   - had some leg swelling on amlodopine per her report - reports cough on losartan per her report - compliant with meds   Past Medical History:  Diagnosis Date   Cancer ([place])    breast    Coronary artery disease due to lipid rich plaque 04/13/2016   Diabetes mellitus without complication ([place])    Diverticulosis of intestine without bleeding 12/13/2016   History of right mastectomy 11/15/2014   Hypothyroidism    Overactive bladder    Paget's disease of the bone    Paroxysmal atrial fibrillation ([facility]) 06/19/2014   Restless leg syndrome      Allergies  Allergen Reactions   Mirapex [Pramipexole Dihydrochloride] Itching and Swelling   Penicillins Rash   Atorvastatin Other (See Comments)   Fosamax [Alendronate] Other (See Comments)   Gabapentin Other (See Comments)    Couldn't move legs during the night after taking.   Levothyroxine Itching   Symbicort [Budesonide-Formoterol Fumarate] Itching     Current Outpatient Medications  Medication Sig Dispense Refill   atorvastatin (LIPITOR) 10 MG tablet Take 1 tablet (10 mg total) by mouth every evening. 90 tablet 1   calcium carbonate (OSCAL) 1500 (600 Ca) MG TABS tablet Take 2 tablets by mouth daily.      chlorthalidone (HYGROTON) 25 MG tablet  TAKE 1/2 TABLET BY MOUTH EVERY DAY (Patient taking differently: Take 25 mg by mouth daily.) 45 tablet 3   furosemide (LASIX) 20 MG tablet Take 20 mg by mouth daily as needed for edema.      loratadine (CLARITIN) 10 MG tablet Take 10 mg by mouth daily.     magnesium oxide (MAG-OX) 400 (241.3 Mg) MG tablet Take 400 mg by mouth daily.      meloxicam (MOBIC) 7.5 MG tablet Take 1 tablet (7.5 mg total) by mouth daily. (Patient taking differently: Take 7.5 mg by mouth daily as needed.) 60 tablet 0   metoprolol tartrate (LOPRESSOR) 25 MG tablet TAKE 1 AND 1/2 TABLETS BY MOUTH 2 TIMES DAILY 270 tablet 3   omeprazole (PRILOSEC OTC) 20 MG tablet Take 20 mg by mouth daily.     oxybutynin (DITROPAN-XL) 10 MG 24 hr tablet TAKE 1 TABLET EVERY DAY 90 tablet 1   pantoprazole (PROTONIX) 40 MG tablet Take 40 mg by mouth daily.     SYNTHROID 112 MCG tablet Take 112 mcg by mouth daily before breakfast.      warfarin (COUMADIN) 5 MG tablet TAKE ONE (1) TABLET EACH DAY 30 tablet 3   No current facility-administered medications for this visit.     Past Surgical History:  Procedure Laterality Date  ABDOMINAL HYSTERECTOMY  1999   APPENDECTOMY     as a child   CATARACT EXTRACTION Bilateral Fall 2013   Dr. Charise Killian   CATARACT EXTRACTION, BILATERAL     EYE SURGERY Bilateral Fall 2013   Cat Sx - Dr. Charise Killian   MASTECTOMY Right      Allergies  Allergen Reactions   Mirapex [Pramipexole Dihydrochloride] Itching and Swelling   Penicillins Rash   Atorvastatin Other (See Comments)   Fosamax [Alendronate] Other (See Comments)   Gabapentin Other (See Comments)    Couldn't move legs during the night after taking.   Levothyroxine Itching   Symbicort [Budesonide-Formoterol Fumarate] Itching      Family History  Problem Relation Age of Onset   Breast cancer Mother    Colon cancer Father    Kidney cancer Father    Bladder Cancer Sister    Suicidality Brother    Heart attack Maternal Grandmother    Breast cancer  Sister    Stomach cancer Sister    Ovarian cancer Sister    Breast cancer Sister    Breast cancer Sister    Diabetes Sister    Other Brother        parathyroid disorder; Engelmann's disease   Angelman syndrome Son    Berenice Primas' disease Daughter      Social History Desiree Bowers reports that she has quit smoking. She has never used smokeless tobacco. Desiree Bowers reports no history of alcohol use.   Review of Systems CONSTITUTIONAL: No weight loss, fever, chills, weakness or fatigue.  HEENT: Eyes: No visual loss, blurred vision, double vision or yellow sclerae.No hearing loss, sneezing, congestion, runny nose or sore throat.  SKIN: No rash or itching.  CARDIOVASCULAR: per hpi RESPIRATORY: No shortness of breath, cough or sputum.  GASTROINTESTINAL: No anorexia, nausea, vomiting or diarrhea. No abdominal pain or blood.  GENITOURINARY: No burning on urination, no polyuria NEUROLOGICAL: No headache, dizziness, syncope, paralysis, ataxia, numbness or tingling in the extremities. No change in bowel or bladder control.  MUSCULOSKELETAL: No muscle, back pain, joint pain or stiffness.  LYMPHATICS: No enlarged nodes. No history of splenectomy.  PSYCHIATRIC: No history of depression or anxiety.  ENDOCRINOLOGIC: No reports of sweating, cold or heat intolerance. No polyuria or polydipsia.  Marland Kitchen   Physical Examination Today's Vitals   11/17/21 1457  BP: 128/64  Pulse: 64  SpO2: 96%  Weight: 188 lb 12.8 oz (85.6 kg)  Height: '5\' 5"'$  (1.651 m)   Body mass index is 31.42 kg/m.  Gen: resting comfortably, no acute distress HEENT: no scleral icterus, pupils equal round and reactive, no palptable cervical adenopathy,  CV: RRR, no m/r/g no jvd Resp: Clear to auscultation bilaterally GI: abdomen is soft, non-tender, non-distended, normal bowel sounds, no hepatosplenomegaly MSK: extremities are warm, no edema.  Skin: warm, no rash Neuro:  no focal deficits Psych: appropriate affect   Diagnostic  Studies 08/2015 cath [facility] FINDINGS:  Coronary Angiography 1. Left Main - Normal 2. Left anterior descending artery - the LAD gives off a small first diagonal and a large second diagonal, normal. 3. Diagonals - Normal 4. Left Circumflex - the circumflex gives off a large OM1, small OM 2, and moderately large OM 3, normal. 5. Obtuse Marginals - Normal 6. Right Coronary Artery - the RCA gives off the PDA and small PLV branches, normal.  There is a conus Latoyna Hird that originates on the aorta, dual lumen with the RCA. 7. Posterior Descending Artery - Normal  Additional comments on  angiography: Right Dominance   1.  Normal coronary arteries. 2.  LVEDP 20 3.  RRA access.  There was spasm of the radial artery near the level of the elbow so access was changed from [place] to right femoral artery.  RFA was closed with Perclose vascular suture device. 4. No Complications.   RECOMMENDATIONS:  1. Continue workup for etiology of current symptoms. 2. Followup with Dr. Hamilton Capri for with her PCP. 3. Continue risk factor modification   10/2019 echo IMPRESSIONS     1. Left ventricular ejection fraction, by estimation, is 55 to 60%. The  left ventricle has normal function. The left ventricle has no regional  wall motion abnormalities. There is mild left ventricular hypertrophy of  the basal-septal segment. Left  ventricular diastolic parameters are consistent with Grade I diastolic  dysfunction (impaired relaxation).   2. Right ventricular systolic function is low normal. The right  ventricular size is normal. There is normal pulmonary artery systolic  pressure.   3. The mitral valve is grossly normal. Mild to moderate mitral valve  regurgitation.   4. Tricuspid valve regurgitation is moderate.   5. The aortic valve is tricuspid. Aortic valve regurgitation is trivial.  No aortic stenosis is present.   6. The inferior vena cava is normal in size with greater than 50%  respiratory variability,  suggesting right atrial pressure of 3 mmHg.     Assessment and Plan  1.PAF - confirmed diagnosis by monitor, perhaps the etiology of her retinal ischemia - eliquis and xarelto were too expensive, she is on coumadin - some recent palpitations, increase lopressor to '50mg'$  bid   2. Valvular heart disease - mild to mod MR and mod TR - continue to monitor, would repeat echo next year. - no symptoms   3. HTN She is at goal, continue current meds      Arnoldo Lenis, M.D.

## 2021-11-17 NOTE — Patient Instructions (Signed)
Medication Instructions:  Increase Lopressor to 50mg twice a day   Continue all other medications.     Labwork: none  Testing/Procedures: none  Follow-Up: 6 months   Any Other Special Instructions Will Be Listed Below (If Applicable).   If you need a refill on your cardiac medications before your next appointment, please call your pharmacy.  

## 2021-12-22 ENCOUNTER — Ambulatory Visit (INDEPENDENT_AMBULATORY_CARE_PROVIDER_SITE_OTHER): Payer: Medicare Other | Admitting: *Deleted

## 2021-12-22 DIAGNOSIS — I480 Paroxysmal atrial fibrillation: Secondary | ICD-10-CM

## 2021-12-22 DIAGNOSIS — Z5181 Encounter for therapeutic drug level monitoring: Secondary | ICD-10-CM

## 2021-12-22 LAB — POCT INR: INR: 1.7 — AB (ref 2.0–3.0)

## 2021-12-22 NOTE — Patient Instructions (Signed)
Take warfarin 1 1/2 tablets today then resume 1/2 tablet daily except 1 tablet on Sundays, Tuesdays and Thursdays Continue to eat 1 serving of greens/week.  Recheck in 3 weeks

## 2022-01-12 ENCOUNTER — Ambulatory Visit (INDEPENDENT_AMBULATORY_CARE_PROVIDER_SITE_OTHER): Payer: Medicare Other | Admitting: *Deleted

## 2022-01-12 DIAGNOSIS — Z5181 Encounter for therapeutic drug level monitoring: Secondary | ICD-10-CM | POA: Diagnosis not present

## 2022-01-12 DIAGNOSIS — I480 Paroxysmal atrial fibrillation: Secondary | ICD-10-CM | POA: Diagnosis not present

## 2022-01-12 LAB — POCT INR: INR: 1.9 — AB (ref 2.0–3.0)

## 2022-01-12 NOTE — Patient Instructions (Signed)
Increase warfarin to 1 tablet daily except 1/2 tablet on Mondays, Wednesdays and Fridays Continue to eat 1 serving of greens/week.  Recheck in 4 weeks

## 2022-01-25 ENCOUNTER — Other Ambulatory Visit: Payer: Self-pay | Admitting: Cardiology

## 2022-01-25 DIAGNOSIS — I480 Paroxysmal atrial fibrillation: Secondary | ICD-10-CM

## 2022-01-25 NOTE — Telephone Encounter (Signed)
Request for warfarin refill:  Last INR was 1.9 on 01/12/22 Next INR due on 02/09/22   LOV was 11/17/21  Zandra Abts MD  Refill approved.

## 2022-02-04 ENCOUNTER — Ambulatory Visit: Payer: Medicare Other | Attending: Cardiology | Admitting: *Deleted

## 2022-02-04 DIAGNOSIS — Z5181 Encounter for therapeutic drug level monitoring: Secondary | ICD-10-CM | POA: Diagnosis not present

## 2022-02-04 DIAGNOSIS — I480 Paroxysmal atrial fibrillation: Secondary | ICD-10-CM | POA: Insufficient documentation

## 2022-02-04 LAB — POCT INR: INR: 3.5 — AB (ref 2.0–3.0)

## 2022-02-04 NOTE — Patient Instructions (Signed)
Hold warfarin tonight then decrease dose to 1/2 tablet daily except 1 tablet on Sundays, Tuesdays and Thursdays Continue to eat 1 serving of greens/week.  Recheck in 3 weeks

## 2022-02-25 ENCOUNTER — Ambulatory Visit: Payer: Medicare Other | Attending: Cardiology | Admitting: *Deleted

## 2022-02-25 DIAGNOSIS — Z5181 Encounter for therapeutic drug level monitoring: Secondary | ICD-10-CM | POA: Insufficient documentation

## 2022-02-25 DIAGNOSIS — I480 Paroxysmal atrial fibrillation: Secondary | ICD-10-CM | POA: Diagnosis not present

## 2022-02-25 LAB — POCT INR: INR: 2.5 (ref 2.0–3.0)

## 2022-02-25 NOTE — Patient Instructions (Signed)
Description   Continue to take warfarin 1/2 tablet daily except 1 tablet on Sundays, Tuesdays and Thursdays Continue to eat 1 serving of greens/week.  Recheck in 4  weeks

## 2022-03-12 NOTE — Progress Notes (Signed)
[facility] Note  03/19/2022     CHIEF COMPLAINT Patient presents for Retina Follow Up    HISTORY OF PRESENT ILLNESS: Desiree Bowers is a 76 y.o. female who presents to the clinic today for:   HPI     Retina Follow Up   Patient presents with  Other.  In left eye.  I, the attending physician,  performed the HPI with the patient and updated documentation appropriately.        Comments   Patient here for 12 months retina follow up for ocular ischemia OS. Patient states vision about the same. No eye pain.       Last edited by [name], MD on 03/19/2022 12:52 PM.     pt states vision is stable, no complaints today, no health concerns  Referring physician: Loman [address], [place],  [place] [id number]  HISTORICAL INFORMATION:   Selected notes from the MEDICAL RECORD NUMBER Referred by Dr. Parke Simmers for retina eval, concern for RAO OS   CURRENT MEDICATIONS: No current outpatient medications on file. (Ophthalmic Drugs)   No current facility-administered medications for this visit. (Ophthalmic Drugs)   Current Outpatient Medications (Other)  Medication Sig   calcium carbonate (OSCAL) 1500 (600 Ca) MG TABS tablet Take 2 tablets by mouth daily.    chlorthalidone (HYGROTON) 25 MG tablet Take 25 mg by mouth daily.   furosemide (LASIX) 20 MG tablet Take 20 mg by mouth daily as needed for edema.    loratadine (CLARITIN) 10 MG tablet Take 10 mg by mouth daily.   magnesium oxide (MAG-OX) 400 (241.3 Mg) MG tablet Take 400 mg by mouth daily.    meloxicam (MOBIC) 7.5 MG tablet Take 7.5 mg by mouth as needed for pain.   metoprolol tartrate (LOPRESSOR) 50 MG tablet Take 1 tablet (50 mg total) by mouth 2 (two) times daily.   oxybutynin (DITROPAN-XL) 10 MG 24 hr tablet TAKE 1 TABLET EVERY DAY   pantoprazole (PROTONIX) 40 MG tablet Take 40 mg by mouth daily.   SYNTHROID 112 MCG tablet Take 112 mcg by mouth daily before breakfast.     warfarin (COUMADIN) 5 MG tablet Take 1 tablet daily except 1/2 tablet on Mondays, Wednesdays and Fridays or as directed.   No current facility-administered medications for this visit. (Other)   REVIEW OF SYSTEMS: ROS   Positive for: Gastrointestinal, Musculoskeletal, Endocrine, Cardiovascular, Eyes Negative for: Constitutional, Neurological, Skin, Genitourinary, HENT, Respiratory, Psychiatric, Allergic/Imm, Heme/Lymph Last edited by Theodore Demark, COA on 03/19/2022  9:56 AM.     ALLERGIES Allergies  Allergen Reactions   Mirapex [Pramipexole Dihydrochloride] Itching and Swelling   Penicillins Rash   Atorvastatin Other (See Comments)   Fosamax [Alendronate] Other (See Comments)   Gabapentin Other (See Comments)    Couldn't move legs during the night after taking.   Levothyroxine Itching   Symbicort [Budesonide-Formoterol Fumarate] Itching   PAST MEDICAL HISTORY Past Medical History:  Diagnosis Date   Cancer [facility])    breast    Coronary artery disease due to lipid rich plaque 04/13/2016   Diabetes mellitus without complication ([place])    Diverticulosis of intestine without bleeding 12/13/2016   History of right mastectomy 11/15/2014   Hypothyroidism    Overactive bladder    Paget's disease of the bone    Paroxysmal atrial fibrillation ([place]) 06/19/2014   Restless leg syndrome    Past Surgical History:  Procedure Laterality Date   ABDOMINAL HYSTERECTOMY  1999   APPENDECTOMY     as a child   CATARACT EXTRACTION Bilateral Fall 2013   Dr. Charise Killian   CATARACT EXTRACTION, BILATERAL     EYE SURGERY Bilateral Fall 2013   Cat Sx - Dr. Charise Killian   MASTECTOMY Right    FAMILY HISTORY Family History  Problem Relation Age of Onset   Breast cancer Mother    Colon cancer Father    Kidney cancer Father    Bladder Cancer Sister    Suicidality Brother    Heart attack Maternal Grandmother    Breast cancer Sister    Stomach cancer Sister    Ovarian cancer Sister    Breast cancer Sister     Breast cancer Sister    Diabetes Sister    Other Brother        parathyroid disorder; Engelmann's disease   Angelman syndrome Son    Graves' disease Daughter    SOCIAL HISTORY Social History   Tobacco Use   Smoking status: Former   Smokeless tobacco: Never  Scientific laboratory technician Use: Never used  Substance Use Topics   Alcohol use: No   Drug use: No       OPHTHALMIC EXAM:  Base Eye Exam     Visual Acuity (Snellen - Linear)       Right Left   Dist [place] 20/25 -1 20/20   Dist ph [place] 20/20 -2          Tonometry (Tonopen, 9:52 AM)       Right Left   Pressure 09 11         Pupils       Dark Light Shape React APD   Right 3 2 Round Brisk None   Left 3 2 Round Brisk None         Visual Fields (Counting fingers)       Left Right    Full Full         Extraocular Movement       Right Left    Full, Ortho Full, Ortho         Neuro/Psych     Oriented x3: Yes   Mood/Affect: Normal         Dilation     Both eyes: 1.0% Mydriacyl, 2.5% Phenylephrine @ 9:51 AM           Slit Lamp and Fundus Exam     Slit Lamp Exam       Right Left   Lids/Lashes Dermatochalasis - upper lid, Meibomian gland dysfunction Dermatochalasis - upper lid, Meibomian gland dysfunction   Conjunctiva/Sclera White and quiet, heavy pigmentation superior hemisphere White and quiet   Cornea Arcus, temporal cataract wounds with mild corneal haze, 2+ Punctate epithelial erosions, decreased TBUT, tear film debris Arcus, well healed temporal cataract wounds, 2+ Punctate epithelial erosions, nasal LRI, tear film debris   Anterior Chamber deep and clear deep and clear   Iris Round and dilated, No NVI Round and dilated, No NVI   Lens PC IOL in good position PC IOL in good position   Anterior Vitreous Vitreous syneresis Vitreous syneresis         Fundus Exam       Right Left   Disc mild Pallor, Sharp rim mild Pallor, Sharp rim, Compact   C/D Ratio 0.5 0.2   Macula Flat, Good  foveal reflex, mild Epiretinal membrane, mild Retinal pigment epithelial mottling, No heme or edema Flat, Blunted foveal reflex, mild Retinal  pigment epithelial mottling, rare, fine drusen, No heme or edema   Vessels attenuated, mild tortuosity attenuated, mild tortuosity   Periphery Attached, mild peripheral drusen, No heme Attached, scattered peripheral drusen, focal, patchy RPE changes nasal periphery, No heme            IMAGING AND PROCEDURES  Imaging and Procedures for '@TODAY'$ @  OCT, Retina - OU - Both Eyes       Right Eye Quality was good. Central Foveal Thickness: 299. Progression has worsened. Findings include normal foveal contour, no IRF, no SRF, epiretinal membrane (mild ERM, Rare drusen and peripheral ORA).   Left Eye Quality was good. Central Foveal Thickness: 266. Progression has been stable. Findings include normal foveal contour, no IRF, no SRF, retinal drusen (Rare drusen, irregular RPE contour -- mild).   Notes *Images captured and stored on drive  Diagnosis / Impression:  NFP, no IRF/SRF OU OD: mild ERM, Rare drusen and peripheral ORA OS: Rare drusen, irregular RPE contour -- mild  Clinical management:  See below  Abbreviations: NFP - Normal foveal profile. CME - cystoid macular edema. PED - pigment epithelial detachment. IRF - intraretinal fluid. SRF - subretinal fluid. EZ - ellipsoid zone. ERM - epiretinal membrane. ORA - outer retinal atrophy. ORT - outer retinal tubulation. SRHM - subretinal hyper-reflective material      Fluorescein Angiography Optos (Transit OS)       Right Eye Progression has been stable. Early phase findings include staining. Mid/Late phase findings include staining.   Left Eye Progression has been stable. Early phase findings include delayed filling. Mid/Late phase findings include staining, window defect, microaneurysm.   Notes **Images stored on drive**  Impression: OD: mild peripapillary staining and nasal periphery --  no leakage OS: delayed filling time -- stably improved from April 2021, non leaking MA IT periphery, no leakage             ASSESSMENT/PLAN:    ICD-10-CM   1. Retinal ischemia  H35.82 OCT, Retina - OU - Both Eyes    2. Essential hypertension  I10     3. Hypertensive retinopathy of both eyes  H35.033 OCT, Retina - OU - Both Eyes    Fluorescein Angiography Optos (Transit OS)    4. Pseudophakia of both eyes  Z96.1      1. Ocular/retinal ischemia OS  - pt has history of near syncopal episodes -- notably hypotensive during episodes in early 2021  - pt awoke one morning prior to initial consult with decreased vision OS  - pt presented to [facility] ED and had MRI brain to r/o stroke on 3.24 -- no recent infarction, hemorrhage or mass -- just microvascular ischemic changes  - BCVA today 20/20  - exam with no significant pathologic findings   - OCT without IRF/SRF but RPE contour quite irregular  - FA 4.14.21 shows delayed filling time  - repeat FA 12.9.21 with improved filling time  - repeat FA (10.13.23) shows OS: delayed filling time -- stably improved from April 2021, non-leaking MA IT periphery, no leakage elsewhere  - suspect near syncopal/hypotensive episodes + visual episodes may have vascular etiology  - MRI was negative on 3.24.21  - pt had event monitor placed -- showed signs of PAF --> now on warfarin  - pt underwent echo and carotid doppler on 05.26.21, both were normal  - pt has had stable vision without episodes of decreased vision  - f/u 1 yr -- DFE, OCT  2,3. Hypertensive retinopathy OU  -  discussed importance of tight BP control  - monitor   4. Pseudophakia OU  - s/p CE/IOL (2013, Dr. Dolores Lory)  - beautiful surgery, doing well  - monitor   Ophthalmic Meds Ordered this visit:  No orders of the defined types were placed in this encounter.    Return in about 1 year (around 03/20/2023) for f/u ocular ischemia OS, DFE, OCT.  There are no Patient Instructions on  file for this visit.   Explained the diagnoses, plan, and follow up with the patient and they expressed understanding.  Patient expressed understanding of the importance of proper follow up care.   This document serves as a record of services personally performed by Gardiner Sleeper, MD, PhD. It was created on their behalf by San Jetty. Owens Shark, OA an ophthalmic technician. The creation of this record is the provider's dictation and/or activities during the visit.    Electronically signed by: San Jetty. Owens Shark, [place] 10.06.2023 12:54 PM  Gardiner Sleeper, M.D., Ph.D. Diseases & Surgery of the Retina and Vitreous [facility]  I have reviewed the above documentation for accuracy and completeness, and I agree with the above. Gardiner Sleeper, M.D., Ph.D. 03/19/22 12:56 PM   Abbreviations: M myopia (nearsighted); A astigmatism; H hyperopia (farsighted); P presbyopia; Mrx spectacle prescription;  CTL contact lenses; OD right eye; OS left eye; OU both eyes  XT exotropia; ET esotropia; PEK punctate epithelial keratitis; PEE punctate epithelial erosions; DES dry eye syndrome; MGD meibomian gland dysfunction; ATs artificial tears; PFAT's preservative free artificial tears; [place] nuclear sclerotic cataract; PSC posterior subcapsular cataract; ERM epi-retinal membrane; PVD posterior vitreous detachment; RD retinal detachment; DM diabetes mellitus; DR diabetic retinopathy; NPDR non-proliferative diabetic retinopathy; PDR proliferative diabetic retinopathy; CSME clinically significant macular edema; DME diabetic macular edema; dbh dot blot hemorrhages; CWS cotton wool spot; POAG primary open angle glaucoma; C/D cup-to-disc ratio; HVF humphrey visual field; GVF goldmann visual field; OCT optical coherence tomography; IOP intraocular pressure; BRVO Branch retinal vein occlusion; CRVO central retinal vein occlusion; CRAO central retinal artery occlusion; BRAO branch retinal artery occlusion; RT retinal  tear; SB scleral buckle; PPV pars plana vitrectomy; VH Vitreous hemorrhage; PRP panretinal laser photocoagulation; IVK intravitreal kenalog; VMT vitreomacular traction; MH Macular hole;  NVD neovascularization of the disc; NVE neovascularization elsewhere; AREDS age related eye disease study; ARMD age related macular degeneration; POAG primary open angle glaucoma; EBMD epithelial/anterior basement membrane dystrophy; ACIOL anterior chamber intraocular lens; IOL intraocular lens; PCIOL posterior chamber intraocular lens; Phaco/IOL phacoemulsification with intraocular lens placement; Armstrong photorefractive keratectomy; LASIK laser assisted in situ keratomileusis; HTN hypertension; DM diabetes mellitus; COPD chronic obstructive pulmonary disease

## 2022-03-19 ENCOUNTER — Ambulatory Visit (INDEPENDENT_AMBULATORY_CARE_PROVIDER_SITE_OTHER): Payer: Medicare Other | Admitting: Ophthalmology

## 2022-03-19 ENCOUNTER — Encounter (INDEPENDENT_AMBULATORY_CARE_PROVIDER_SITE_OTHER): Payer: Self-pay | Admitting: Ophthalmology

## 2022-03-19 DIAGNOSIS — H35033 Hypertensive retinopathy, bilateral: Secondary | ICD-10-CM | POA: Diagnosis not present

## 2022-03-19 DIAGNOSIS — Z961 Presence of intraocular lens: Secondary | ICD-10-CM

## 2022-03-19 DIAGNOSIS — I10 Essential (primary) hypertension: Secondary | ICD-10-CM | POA: Diagnosis not present

## 2022-03-19 DIAGNOSIS — H3582 Retinal ischemia: Secondary | ICD-10-CM

## 2022-03-25 ENCOUNTER — Ambulatory Visit: Payer: Medicare Other | Attending: Cardiology | Admitting: *Deleted

## 2022-03-25 DIAGNOSIS — I480 Paroxysmal atrial fibrillation: Secondary | ICD-10-CM | POA: Diagnosis not present

## 2022-03-25 DIAGNOSIS — Z5181 Encounter for therapeutic drug level monitoring: Secondary | ICD-10-CM

## 2022-03-25 LAB — POCT INR: INR: 2.9 (ref 2.0–3.0)

## 2022-03-25 NOTE — Patient Instructions (Signed)
Continue to take warfarin 1/2 tablet daily except 1 tablet on Sundays, Tuesdays and Thursdays Continue to eat 1 serving of greens/week.  Recheck in 4  weeks

## 2022-04-22 ENCOUNTER — Ambulatory Visit: Payer: Medicare Other | Attending: Cardiology | Admitting: *Deleted

## 2022-04-22 DIAGNOSIS — I480 Paroxysmal atrial fibrillation: Secondary | ICD-10-CM | POA: Diagnosis not present

## 2022-04-22 DIAGNOSIS — Z5181 Encounter for therapeutic drug level monitoring: Secondary | ICD-10-CM | POA: Diagnosis not present

## 2022-04-22 LAB — POCT INR: INR: 2.7 (ref 2.0–3.0)

## 2022-04-22 NOTE — Patient Instructions (Signed)
Continue to take warfarin 1/2 tablet daily except 1 tablet on Sundays, Tuesdays and Thursdays Continue to eat 1 serving of greens/week.  Recheck in 6 weeks

## 2022-05-16 ENCOUNTER — Other Ambulatory Visit: Payer: Self-pay | Admitting: Cardiology

## 2022-06-03 ENCOUNTER — Encounter: Payer: Self-pay | Admitting: Cardiology

## 2022-06-03 ENCOUNTER — Ambulatory Visit: Payer: Medicare Other | Attending: Cardiology | Admitting: Cardiology

## 2022-06-03 ENCOUNTER — Ambulatory Visit (INDEPENDENT_AMBULATORY_CARE_PROVIDER_SITE_OTHER): Payer: Medicare Other | Admitting: *Deleted

## 2022-06-03 VITALS — BP 150/80 | HR 66 | Ht 65.0 in | Wt 186.8 lb

## 2022-06-03 DIAGNOSIS — I10 Essential (primary) hypertension: Secondary | ICD-10-CM | POA: Diagnosis not present

## 2022-06-03 DIAGNOSIS — Z5181 Encounter for therapeutic drug level monitoring: Secondary | ICD-10-CM | POA: Insufficient documentation

## 2022-06-03 DIAGNOSIS — I480 Paroxysmal atrial fibrillation: Secondary | ICD-10-CM | POA: Diagnosis not present

## 2022-06-03 DIAGNOSIS — I38 Endocarditis, valve unspecified: Secondary | ICD-10-CM | POA: Insufficient documentation

## 2022-06-03 DIAGNOSIS — D6869 Other thrombophilia: Secondary | ICD-10-CM | POA: Diagnosis present

## 2022-06-03 DIAGNOSIS — I340 Nonrheumatic mitral (valve) insufficiency: Secondary | ICD-10-CM | POA: Diagnosis not present

## 2022-06-03 LAB — POCT INR: INR: 3.9 — AB (ref 2.0–3.0)

## 2022-06-03 NOTE — Progress Notes (Signed)
Clinical Summary Desiree Bowers is a 76 y.o.female seen today for follow up of the following medical problems.      1. PAF -08/2019 monitor showed episodes of PAF   - no recent palpitations - compliant with meds - no bleding on coumadin. DOACs were too expensive     2. Vision changes - seen in ER 08/29/19 with acute vision change - MRI without acute findings - seen by optho, diagnosed with retinal ischemia.  - may have been related to her PAF which just diagnosed by monitor.      3. Valvular heart disease - 02/2019 echo with mild to mod MR, mod TR,  - denies symptoms.    10/2019 echo LVEF 55-60%, mild to mod MR, mod TR   - no SOB/DOE, no LE edema     4. HTN   - had some leg swelling on amlodopine per her report - reports cough on losartan per her report - compliant with meds  - home bp's usually 130s/60s Past Medical History:  Diagnosis Date   Cancer ([place])    breast    Coronary artery disease due to lipid rich plaque 04/13/2016   Diabetes mellitus without complication ([place])    Diverticulosis of intestine without bleeding 12/13/2016   History of right mastectomy 11/15/2014   Hypothyroidism    Overactive bladder    Paget's disease of the bone    Paroxysmal atrial fibrillation (HCC) 06/19/2014   Restless leg syndrome      Allergies  Allergen Reactions   Mirapex [Pramipexole Dihydrochloride] Itching and Swelling   Penicillins Rash   Atorvastatin Other (See Comments)   Fosamax [Alendronate] Other (See Comments)   Gabapentin Other (See Comments)    Couldn't move legs during the night after taking.   Levothyroxine Itching   Symbicort [Budesonide-Formoterol Fumarate] Itching     Current Outpatient Medications  Medication Sig Dispense Refill   calcium carbonate (OSCAL) 1500 (600 Ca) MG TABS tablet Take 2 tablets by mouth daily.      chlorthalidone (HYGROTON) 25 MG tablet Take 25 mg by mouth daily.     furosemide (LASIX) 20 MG tablet Take 20 mg by mouth daily as  needed for edema.      loratadine (CLARITIN) 10 MG tablet Take 10 mg by mouth daily.     magnesium oxide (MAG-OX) 400 (241.3 Mg) MG tablet Take 400 mg by mouth daily.      meloxicam (MOBIC) 7.5 MG tablet Take 7.5 mg by mouth as needed for pain.     metoprolol tartrate (LOPRESSOR) 50 MG tablet TAKE 1 TABLET BY MOUTH TWICE A DAY 180 tablet 2   oxybutynin (DITROPAN-XL) 10 MG 24 hr tablet TAKE 1 TABLET EVERY DAY 90 tablet 1   pantoprazole (PROTONIX) 40 MG tablet Take 40 mg by mouth daily.     SYNTHROID 112 MCG tablet Take 112 mcg by mouth daily before breakfast.      warfarin (COUMADIN) 5 MG tablet Take 1 tablet daily except 1/2 tablet on Mondays, Wednesdays and Fridays or as directed. 90 tablet 1   No current facility-administered medications for this visit.     Past Surgical History:  Procedure Laterality Date   ABDOMINAL HYSTERECTOMY  1999   APPENDECTOMY     as a child   CATARACT EXTRACTION Bilateral Fall 2013   Dr. Charise Killian   CATARACT EXTRACTION, BILATERAL     EYE SURGERY Bilateral Fall 2013   Cat Sx - Dr. Charise Killian   MASTECTOMY  Right      Allergies  Allergen Reactions   Mirapex [Pramipexole Dihydrochloride] Itching and Swelling   Penicillins Rash   Atorvastatin Other (See Comments)   Fosamax [Alendronate] Other (See Comments)   Gabapentin Other (See Comments)    Couldn't move legs during the night after taking.   Levothyroxine Itching   Symbicort [Budesonide-Formoterol Fumarate] Itching      Family History  Problem Relation Age of Onset   Breast cancer Mother    Colon cancer Father    Kidney cancer Father    Bladder Cancer Sister    Suicidality Brother    Heart attack Maternal Grandmother    Breast cancer Sister    Stomach cancer Sister    Ovarian cancer Sister    Breast cancer Sister    Breast cancer Sister    Diabetes Sister    Other Brother        parathyroid disorder; Engelmann's disease   Angelman syndrome Son    Berenice Primas' disease Daughter      Social  History Ms. Heavrin reports that she has quit smoking. She has never used smokeless tobacco. Ms. Sundquist reports no history of alcohol use.   Review of Systems CONSTITUTIONAL: No weight loss, fever, chills, weakness or fatigue.  HEENT: Eyes: No visual loss, blurred vision, double vision or yellow sclerae.No hearing loss, sneezing, congestion, runny nose or sore throat.  SKIN: No rash or itching.  CARDIOVASCULAR: per hpi RESPIRATORY: No shortness of breath, cough or sputum.  GASTROINTESTINAL: No anorexia, nausea, vomiting or diarrhea. No abdominal pain or blood.  GENITOURINARY: No burning on urination, no polyuria NEUROLOGICAL: No headache, dizziness, syncope, paralysis, ataxia, numbness or tingling in the extremities. No change in bowel or bladder control.  MUSCULOSKELETAL: No muscle, back pain, joint pain or stiffness.  LYMPHATICS: No enlarged nodes. No history of splenectomy.  PSYCHIATRIC: No history of depression or anxiety.  ENDOCRINOLOGIC: No reports of sweating, cold or heat intolerance. No polyuria or polydipsia.  Marland Kitchen   Physical Examination Vitals:   06/03/22 1450 06/03/22 1514  BP: (!) 158/80 (!) 150/80  Pulse: 66   SpO2: 95%    Filed Weights   06/03/22 1450  Weight: 186 lb 12.8 oz (84.7 kg)    Gen: resting comfortably, no acute distress HEENT: no scleral icterus, pupils equal round and reactive, no palptable cervical adenopathy,  CV Resp: Clear to auscultation bilaterally GI: abdomen is soft, non-tender, non-distended, normal bowel sounds, no hepatosplenomegaly MSK: extremities are warm, no edema.  Skin: warm, no rash Neuro:  no focal deficits Psych: appropriate affect   Diagnostic Studies  08/2015 cath [facility] FINDINGS:  Coronary Angiography 1. Left Main - Normal 2. Left anterior descending artery - the LAD gives off a small first diagonal and a large second diagonal, normal. 3. Diagonals - Normal 4. Left Circumflex - the circumflex gives off a large OM1, small OM  2, and moderately large OM 3, normal. 5. Obtuse Marginals - Normal 6. Right Coronary Artery - the RCA gives off the PDA and small PLV branches, normal.  There is a conus Sanya Kobrin that originates on the aorta, dual lumen with the RCA. 7. Posterior Descending Artery - Normal  Additional comments on angiography: Right Dominance   1.  Normal coronary arteries. 2.  LVEDP 20 3.  RRA access.  There was spasm of the radial artery near the level of the elbow so access was changed from Chrisman to right femoral artery.  RFA was closed with Perclose vascular suture device.  4. No Complications.   RECOMMENDATIONS:  1. Continue workup for etiology of current symptoms. 2. Followup with Dr. Hamilton Capri for with her PCP. 3. Continue risk factor modification   10/2019 echo IMPRESSIONS     1. Left ventricular ejection fraction, by estimation, is 55 to 60%. The  left ventricle has normal function. The left ventricle has no regional  wall motion abnormalities. There is mild left ventricular hypertrophy of  the basal-septal segment. Left  ventricular diastolic parameters are consistent with Grade I diastolic  dysfunction (impaired relaxation).   2. Right ventricular systolic function is low normal. The right  ventricular size is normal. There is normal pulmonary artery systolic  pressure.   3. The mitral valve is grossly normal. Mild to moderate mitral valve  regurgitation.   4. Tricuspid valve regurgitation is moderate.   5. The aortic valve is tricuspid. Aortic valve regurgitation is trivial.  No aortic stenosis is present.   6. The inferior vena cava is normal in size with greater than 50%  respiratory variability, suggesting right atrial pressure of 3 mmHg.      Assessment and Plan   1.PAF/acquired thrombophilia - confirmed diagnosis by monitor, perhaps the etiology of her retinal ischemia - eliquis and xarelto were too expensive, she is on coumadin -no symptoms, continue current meds   2.  Valvular heart disease - mild to mod MR and mod TR - due for repeat echo, will order   3. HTN Elevated in clinic, home numbers have been at goal - update [place] on home bp's Tuesday, continue current meds     Arnoldo Lenis, M.D.,

## 2022-06-03 NOTE — Patient Instructions (Addendum)
Medication Instructions:  Your physician recommends that you continue on your current medications as directed. Please refer to the Current Medication list given to you today.  Labwork: none  Testing/Procedures: Your physician has requested that you have an echocardiogram. Echocardiography is a painless test that uses sound waves to create images of your heart. It provides your doctor with information about the size and shape of your heart and how well your heart's chambers and valves are working. This procedure takes approximately one hour. There are no restrictions for this procedure. Please do NOT wear cologne, perfume, aftershave, or lotions (deodorant is allowed). Please arrive 15 minutes prior to your appointment time.  Follow-Up: Your physician recommends that you schedule a follow-up appointment in: 6 months  Any Other Special Instructions Will Be Listed Below (If Applicable). Your physician has requested that you regularly monitor and record your blood pressure readings at home. Please use the same machine at the same time of day to check your readings and record them. Send readings through mychart on Tuesday next week or call office.  If you need a refill on your cardiac medications before your next appointment, please call your pharmacy.

## 2022-06-03 NOTE — Patient Instructions (Signed)
Hold warfarin tonight then resume 1/2 tablet daily except 1 tablet on Sundays, Tuesdays and Thursdays Continue to eat 1 serving of greens/week.  Recheck in 4 weeks

## 2022-06-08 ENCOUNTER — Telehealth: Payer: Self-pay | Admitting: Cardiology

## 2022-06-08 NOTE — Telephone Encounter (Signed)
Blood Pressure readings that was requested:  150/77; 69 129/71; 79 133/70; 70 135/64; 68

## 2022-06-08 NOTE — Telephone Encounter (Signed)
I will forward to Dr.Branch to review.

## 2022-06-09 NOTE — Telephone Encounter (Signed)
BP's are overall bordreline, I think reasonable to just monitor at this time and if persistent of progresses may need to add another medication at that time. Work on limiting sodium intake to '2000mg'$  daily please  Zandra Abts MD

## 2022-06-09 NOTE — Telephone Encounter (Signed)
Pt notified of Dr. Nelly Laurence instructions. Pt voiced understanding and was thankful for the call.

## 2022-06-14 ENCOUNTER — Ambulatory Visit: Payer: Medicare Other | Attending: Cardiology

## 2022-06-14 DIAGNOSIS — I340 Nonrheumatic mitral (valve) insufficiency: Secondary | ICD-10-CM | POA: Insufficient documentation

## 2022-06-15 LAB — ECHOCARDIOGRAM COMPLETE
AR max vel: 1.69 cm2
AV Peak grad: 7.6 mmHg
Ao pk vel: 1.38 m/s
Area-P 1/2: 2.48 cm2
Calc EF: 60.1 %
MV M vel: 4.91 m/s
MV Peak grad: 96.4 mmHg
P 1/2 time: 1942 msec
S' Lateral: 3.1 cm
Single Plane A2C EF: 61.6 %
Single Plane A4C EF: 58.3 %

## 2022-06-18 ENCOUNTER — Telehealth: Payer: Self-pay | Admitting: Cardiology

## 2022-06-18 NOTE — Telephone Encounter (Signed)
Patient called for her echo results.  I advised her it hasn't been reviewed by the doctor, once it gets reviewed by him the nurse will call her with the results.

## 2022-07-01 ENCOUNTER — Ambulatory Visit: Payer: Medicare Other | Attending: Cardiology | Admitting: *Deleted

## 2022-07-01 DIAGNOSIS — I480 Paroxysmal atrial fibrillation: Secondary | ICD-10-CM | POA: Diagnosis not present

## 2022-07-01 DIAGNOSIS — Z5181 Encounter for therapeutic drug level monitoring: Secondary | ICD-10-CM

## 2022-07-01 LAB — POCT INR: INR: 2.9 (ref 2.0–3.0)

## 2022-07-01 NOTE — Patient Instructions (Signed)
Continue warfarin 1/2 tablet daily except 1 tablet on Sundays, Tuesdays and Thursdays Continue to eat 1 serving of greens/week.  Recheck in 4 weeks

## 2022-07-12 ENCOUNTER — Other Ambulatory Visit: Payer: Self-pay | Admitting: Cardiology

## 2022-07-12 DIAGNOSIS — I480 Paroxysmal atrial fibrillation: Secondary | ICD-10-CM

## 2022-07-12 NOTE — Telephone Encounter (Signed)
Request for warfarin refill:  Last INR was 2.9 on 07/01/22 Next INR due 07/29/22 LOV was 06/03/22  Zandra Abts MD  Refill approved.

## 2022-07-13 ENCOUNTER — Encounter: Payer: Self-pay | Admitting: *Deleted

## 2022-08-05 ENCOUNTER — Ambulatory Visit: Payer: Medicare Other | Attending: Cardiology | Admitting: *Deleted

## 2022-08-05 DIAGNOSIS — I480 Paroxysmal atrial fibrillation: Secondary | ICD-10-CM | POA: Insufficient documentation

## 2022-08-05 DIAGNOSIS — Z5181 Encounter for therapeutic drug level monitoring: Secondary | ICD-10-CM | POA: Insufficient documentation

## 2022-08-05 LAB — POCT INR: POC INR: 2.1

## 2022-08-05 NOTE — Patient Instructions (Signed)
Description   Continue warfarin 1/2 tablet daily except 1 tablet on Sundays, Tuesdays and Thursdays Continue to eat 1 serving of greens/week.  Recheck in 5 weeks

## 2022-09-09 ENCOUNTER — Ambulatory Visit: Payer: Medicare Other | Attending: Cardiology | Admitting: *Deleted

## 2022-09-09 DIAGNOSIS — I480 Paroxysmal atrial fibrillation: Secondary | ICD-10-CM | POA: Insufficient documentation

## 2022-09-09 DIAGNOSIS — Z5181 Encounter for therapeutic drug level monitoring: Secondary | ICD-10-CM | POA: Diagnosis present

## 2022-09-09 LAB — POCT INR: INR: 2.7 (ref 2.0–3.0)

## 2022-09-09 NOTE — Patient Instructions (Signed)
Continue warfarin 1/2 tablet daily except 1 tablet on Sundays, Tuesdays and Thursdays Continue to eat 1 serving of greens/week.  Recheck in 6 weeks 

## 2022-10-07 ENCOUNTER — Other Ambulatory Visit (INDEPENDENT_AMBULATORY_CARE_PROVIDER_SITE_OTHER): Payer: Medicare Other

## 2022-10-07 ENCOUNTER — Ambulatory Visit (INDEPENDENT_AMBULATORY_CARE_PROVIDER_SITE_OTHER): Payer: Medicare Other | Admitting: Orthopaedic Surgery

## 2022-10-07 ENCOUNTER — Encounter: Payer: Self-pay | Admitting: Orthopaedic Surgery

## 2022-10-07 VITALS — Ht 65.0 in | Wt 186.0 lb

## 2022-10-07 DIAGNOSIS — M7541 Impingement syndrome of right shoulder: Secondary | ICD-10-CM | POA: Insufficient documentation

## 2022-10-07 DIAGNOSIS — M79601 Pain in right arm: Secondary | ICD-10-CM

## 2022-10-07 DIAGNOSIS — M47812 Spondylosis without myelopathy or radiculopathy, cervical region: Secondary | ICD-10-CM

## 2022-10-07 DIAGNOSIS — M542 Cervicalgia: Secondary | ICD-10-CM

## 2022-10-07 MED ORDER — BUPIVACAINE HCL 0.25 % IJ SOLN
4.0000 mL | INTRAMUSCULAR | Status: AC | PRN
Start: 2022-10-07 — End: 2022-10-07
  Administered 2022-10-07: 4 mL via INTRA_ARTICULAR

## 2022-10-07 MED ORDER — LIDOCAINE HCL 1 % IJ SOLN
0.5000 mL | INTRAMUSCULAR | Status: AC | PRN
Start: 2022-10-07 — End: 2022-10-07
  Administered 2022-10-07: .5 mL

## 2022-10-07 MED ORDER — METHYLPREDNISOLONE ACETATE 40 MG/ML IJ SUSP
40.0000 mg | INTRAMUSCULAR | Status: AC | PRN
Start: 2022-10-07 — End: 2022-10-07
  Administered 2022-10-07: 40 mg via INTRA_ARTICULAR

## 2022-10-07 NOTE — Progress Notes (Signed)
Office Visit Note   Patient: Desiree Bowers           Date of Birth: [birth year]           MRN: [id number] Visit Date: 10/07/2022              Requested by: Kirstie Peri, MD [address] [place],  [place] [address] PCP: Kirstie Peri, MD   Assessment & Plan: Visit Diagnoses:  1. Right arm pain   2. Neck pain   3. Spondylosis without myelopathy or radiculopathy, cervical region   4. Impingement syndrome of right shoulder     Plan: Right subacromial injection performed some of this looks like rotator cuff or impingement versus cervical spondylosis.  Post shoulder injection she states she was almost completely resolved over symptoms.  Will recheck her in 3 weeks.  We can then make a determination about MRI shoulder versus MRI cervical spine.  Follow-Up Instructions: No follow-ups on file.   Orders:  Orders Placed This Encounter  Procedures   Large Joint Inj   XR Humerus Right   XR Cervical Spine 2 or 3 views   No orders of the defined types were placed in this encounter.     Procedures: Large Joint Inj: R subacromial bursa on 10/07/2022 3:44 PM Indications: pain Details: 22 G 1.5 in needle, lateral approach  Arthrogram: No  Medications: 4 mL bupivacaine 0.25 %; 40 mg methylPREDNISolone acetate 40 MG/ML; 0.5 mL lidocaine 1 % Outcome: tolerated well, no immediate complications Procedure, treatment alternatives, risks and benefits explained, specific risks discussed. Consent was given by the patient. Immediately prior to procedure a time out was called to verify the correct patient, procedure, equipment, support staff and site/side marked as required. Patient was prepped and draped in the usual sterile fashion.       Clinical Data: No additional findings.   Subjective: Chief Complaint  Patient presents with   Right Arm - Pain    HPI 77 year old female referred by Dr.Shah for problems with her shoulder and neck.  She fell in September that time she has had trouble with  overhead activity outstretched reaching.  Pain radiates from her shoulder right side of her neck radiates down proximal third of the forearm but not to her fingers.  She has noticed pain with rotation of her neck no gait problems no bowel bladder symptoms fever or chills.  She has had some type 2 diabetes atrial fibs and cervical spondylosis. Review of Systems all systems noncontributory to HPI.   Objective: Vital Signs: Ht 5\' 5"  (1.651 m)   Wt 186 lb (84.4 kg)   BMI 30.95 kg/m   Physical Exam Constitutional:      Appearance: She is well-developed.  HENT:     Head: Normocephalic.     Right Ear: External ear normal.     Left Ear: External ear normal. There is no impacted cerumen.  Eyes:     Pupils: Pupils are equal, round, and reactive to light.  Neck:     Thyroid: No thyromegaly.     Trachea: No tracheal deviation.  Cardiovascular:     Rate and Rhythm: Normal rate.  Pulmonary:     Effort: Pulmonary effort is normal.  Abdominal:     Palpations: Abdomen is soft.  Musculoskeletal:     Cervical back: No rigidity.  Skin:    General: Skin is warm and dry.  Neurological:     Mental Status: She is alert and oriented to person, place, and  time.  Psychiatric:        Behavior: Behavior normal.     Ortho Exam bilateral brachial plexus tenderness worse on the right than left positive Spurling right and left.  Positive Neer test negative Hawkins test long of the biceps right and left are mildly symmetrically tender negative drop arm test no distal biceps migration axillary sensation deltoid region axillary nerve is intact.  Ulnar nerve at the elbow median nerve at the wrist is normal normal heel-toe gait no lower extremity clonus.  She is able to get her arm up overhead lacks only about 10 degrees full flexion.  Specialty Comments:  mpression  1. No acute or healing fracture of the shoulder or clavicle. 2. Mild acromioclavicular spurring. 3. Small soft tissue calcifications in the  region of the rotator cuff insertion may represent calcific tendinopathy. 4. Bone density is diffusely heterogeneous. This may be due to osteopenia, but is nonspecific. The possibility of multiple myeloma or metastatic disease is not entirely excluded. Should pain persist, recommend further assessment with MRI.   Electronically Signed   By: Narda Rutherford M.D.   On: 04/14/2022 23:14 Narrative  CLINICAL DATA:  Right shoulder pain. Fall 1 month ago. Limited range of motion.  EXAM: RIGHT CLAVICLE - 2+ VIEWS; DG SHOULDER 3+ VIEWS RIGHT  COMPARISON:  None Available.  FINDINGS: Shoulder: No acute or healing fracture. Normal glenohumeral and acromioclavicular alignment. Mild acromioclavicular spurring. Bone density is diffusely heterogeneous, which may be due to osteopenia but is nonspecific. Small soft tissue calcification adjacent to the rotator cuff insertion.  Clavicle: No acute or evidence of healing fracture. Bone density is diffusely heterogeneous which may be due to osteopenia, but is nonspecific. Procedure Note  Rojelio Brenner, MD - 04/14/2022 Formatting of this note might be different from the original. CLINICAL DATA:  Right shoulder pain. Fall 1 month ago. Limited range of motion.  EXAM: RIGHT CLAVICLE - 2+ VIEWS; DG SHOULDER 3+ VIEWS RIGHT  COMPARISON:  None Available.  FINDINGS: Shoulder: No acute or healing fracture. Normal glenohumeral and acromioclavicular alignment. Mild acromioclavicular spurring. Bone density is diffusely heterogeneous, which may be due to osteopenia but is nonspecific. Small soft tissue calcification adjacent to the rotator cuff insertion.  Clavicle: No acute or evidence of healing fracture. Bone density is diffusely heterogeneous which may be due to osteopenia, but is nonspecific.  IMPRESSION: 1. No acute or healing fracture of the shoulder or clavicle. 2. Mild acromioclavicular spurring. 3. Small soft tissue  calcifications in the region of the rotator cuff insertion may represent calcific tendinopathy. 4. Bone density is diffusely heterogeneous. This may be due to osteopenia, but is nonspecific. The possibility of multiple myeloma or metastatic disease is not entirely excluded. Should pain persist, recommend further assessment with MRI.   Electronically Signed   By: Narda Rutherford M.D.   On: 04/14/2022 23:14  Imaging: XR Cervical Spine 2 or 3 views  Result Date: 10/07/2022 AP lateral cervical spine demonstrates some mid cervical reversal of curvature at C4-5.  Trace anterolisthesis at C4-5 and disc base narrowing and spurring C5-6. Impression: Mid cervical spondylosis.  Degenerative changes at C4-5 C5-6 as described above.  XR Humerus Right  Result Date: 10/07/2022 Three-view x-rays right shoulder demonstrate some narrowing of the subacromial space.  Mottled appearance of the head nonspecific could be related to anemia ,multiple myeloma.  Negative for acute bony changes.  Humeral shaft and elbow appear normal. Impression: Right humerus negative for acute changes.  Heterogenous bone density diffuse, nonspecific.  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) 04/13/2016   Primary osteoarthritis involving multiple joints 11/15/2014   Acquired hypothyroidism 11/15/2014   Gastroesophageal reflux disease without esophagitis 04/04/2014   GAD (generalized anxiety disorder)  04/04/2014   ANA positive 01/18/2014   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   Essential (primary) hypertension 09/24/2013   Past Medical History:  Diagnosis Date   Cancer ([facility])    breast    Coronary artery disease due to lipid rich plaque 04/13/2016   Diabetes mellitus without complication (HCC)    Diverticulosis of intestine without bleeding 12/13/2016   History of right mastectomy 11/15/2014   Hypothyroidism    Overactive bladder    Paget's disease of the bone    Paroxysmal atrial fibrillation (HCC) 06/19/2014   Restless leg syndrome     Family History  Problem Relation Age of Onset   Breast cancer Mother    Colon cancer Father    Kidney cancer Father    Bladder Cancer Sister    Suicidality Brother    Heart attack Maternal Grandmother    Breast cancer Sister    Stomach cancer Sister    Ovarian cancer Sister    Breast cancer Sister    Breast cancer Sister    Diabetes Sister    Other Brother        parathyroid disorder; Engelmann's disease   Angelman syndrome Son    Luiz Blare' disease Daughter     Past Surgical History:  Procedure Laterality Date   ABDOMINAL HYSTERECTOMY  1999   APPENDECTOMY     as a child   CATARACT EXTRACTION Bilateral Fall 2013   Dr. Emmit Pomfret   CATARACT EXTRACTION, BILATERAL     EYE SURGERY Bilateral Fall 2013   Cat Sx - Dr. Emmit Pomfret   MASTECTOMY Right    Social History   Occupational History   Occupation: Retired  Tobacco Use   Smoking status: Former   Smokeless tobacco: Never  Building services engineer Use: Never used  Substance and Sexual Activity   Alcohol use: No   Drug use: No   Sexual activity: Not Currently

## 2022-10-14 ENCOUNTER — Ambulatory Visit: Payer: Medicare Other | Attending: Cardiology | Admitting: *Deleted

## 2022-10-14 DIAGNOSIS — I480 Paroxysmal atrial fibrillation: Secondary | ICD-10-CM | POA: Insufficient documentation

## 2022-10-14 DIAGNOSIS — Z5181 Encounter for therapeutic drug level monitoring: Secondary | ICD-10-CM | POA: Insufficient documentation

## 2022-10-14 LAB — POCT INR: INR: 2.4 (ref 2.0–3.0)

## 2022-10-14 NOTE — Patient Instructions (Signed)
Continue warfarin 1/2 tablet daily except 1 tablet on Sundays, Tuesdays and Thursdays Continue to eat 1 serving of greens/week.  Recheck in 6 weeks 

## 2022-10-28 ENCOUNTER — Encounter: Payer: Self-pay | Admitting: Orthopaedic Surgery

## 2022-10-28 ENCOUNTER — Ambulatory Visit (INDEPENDENT_AMBULATORY_CARE_PROVIDER_SITE_OTHER): Payer: Medicare Other | Admitting: Orthopaedic Surgery

## 2022-10-28 VITALS — Ht 65.0 in | Wt 186.0 lb

## 2022-10-28 DIAGNOSIS — M47812 Spondylosis without myelopathy or radiculopathy, cervical region: Secondary | ICD-10-CM

## 2022-10-28 DIAGNOSIS — M7541 Impingement syndrome of right shoulder: Secondary | ICD-10-CM

## 2022-10-28 NOTE — Progress Notes (Signed)
Office Visit Note   Patient: Desiree Bowers           Date of Birth: [birth year]           MRN: [id number] Visit Date: 10/28/2022              Requested by: Kirstie Peri, MD [address] [place],  [place] [address] PCP: Kirstie Peri, MD   Assessment & Plan: Visit Diagnoses:  1. Spondylosis without myelopathy or radiculopathy, cervical region   2. Impingement syndrome of right shoulder     Plan: Patient is having improvement with the injection of her shoulder problem recurs she will let us know.  We offered her some physical therapy for cervical spine she will let us know if if her symptoms increase and she wants to proceed with this.  She has not had imaging studies of her neck done at any time in the past.  Follow-Up Instructions: No follow-ups on file.   Orders:  No orders of the defined types were placed in this encounter.  No orders of the defined types were placed in this encounter.     Procedures: No procedures performed   Clinical Data: No additional findings.   Subjective: Chief Complaint  Patient presents with   Neck - Follow-up   Right Shoulder - Follow-up    HPI 77 year old female returns post injection 10/07/2022 with good relief of arm symptoms in her right shoulder.  She get her arm up overhead fix her hair change close easily and sleeping better.  Neck still gives her some problems where she has slight listhesis and some spondylosis but most of her pain was coming from her shoulder.  ROS: Updated unchanged from 10/07/2022.   Objective: Vital Signs: Update unchanged from 10/07/2022.  Ht 5\' 5"  (1.651 m)   Wt 186 lb (84.4 kg)   BMI 30.95 kg/m   Physical Exam Constitutional:      Appearance: She is well-developed.  HENT:     Head: Normocephalic.     Right Ear: External ear normal.     Left Ear: External ear normal. There is no impacted cerumen.  Eyes:     Pupils: Pupils are equal, round, and reactive to light.  Neck:     Thyroid: No thyromegaly.      Trachea: No tracheal deviation.  Cardiovascular:     Rate and Rhythm: Normal rate.  Pulmonary:     Effort: Pulmonary effort is normal.  Abdominal:     Palpations: Abdomen is soft.  Musculoskeletal:     Cervical back: No rigidity.  Skin:    General: Skin is warm and dry.  Neurological:     Mental Status: She is alert and oriented to person, place, and time.  Psychiatric:        Behavior: Behavior normal.     Ortho Exam patient get her arm up overhead easily.  No pain with flexion negative impingement.  Station hand is intact.  Some discomfort with cervical range of motion.  Specialty Comments:  No specialty comments available.  Imaging: No results found.   PMFS History: Patient Active Problem List   Diagnosis Date Noted   Impingement syndrome of right shoulder 10/07/2022   Spondylosis without myelopathy or radiculopathy, cervical region 01/08/2021   Chronic cough 05/13/2020   Encounter for therapeutic drug monitoring 01/08/2020   PAF (paroxysmal atrial fibrillation) (HCC) 01/01/2020   Restless leg syndrome 06/22/2019   Seasonal allergies 12/21/2018   Lymphedema of right arm 08/08/2017  Facet degeneration of lumbar region 08/04/2017   Overactive bladder 12/13/2016   DM type 2 with diabetic dyslipidemia (HCC) 04/13/2016   Primary osteoarthritis involving multiple joints 11/15/2014   Acquired hypothyroidism 11/15/2014   Gastroesophageal reflux disease without esophagitis 04/04/2014   GAD (generalized anxiety disorder) 04/04/2014   ANA positive 01/18/2014   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   Essential (primary) hypertension 09/24/2013   Past Medical History:  Diagnosis Date   Cancer (HCC)    breast    Coronary artery disease due to lipid rich plaque 04/13/2016   Diabetes mellitus without complication (HCC)    Diverticulosis of intestine without bleeding 12/13/2016   History of right mastectomy 11/15/2014   Hypothyroidism    Overactive bladder    Paget's disease of the  bone    Paroxysmal atrial fibrillation (HCC) 06/19/2014   Restless leg syndrome     Family History  Problem Relation Age of Onset   Breast cancer Mother    Colon cancer Father    Kidney cancer Father    Bladder Cancer Sister    Suicidality Brother    Heart attack Maternal Grandmother    Breast cancer Sister    Stomach cancer Sister    Ovarian cancer Sister    Breast cancer Sister    Breast cancer Sister    Diabetes Sister    Other Brother        parathyroid disorder; Engelmann's disease   Angelman syndrome Son    Luiz Blare' disease Daughter     Past Surgical History:  Procedure Laterality Date   ABDOMINAL HYSTERECTOMY  1999   APPENDECTOMY     as a child   CATARACT EXTRACTION Bilateral Fall 2013   Dr. Emmit Pomfret   CATARACT EXTRACTION, BILATERAL     EYE SURGERY Bilateral Fall 2013   Cat Sx - Dr. Emmit Pomfret   MASTECTOMY Right    Social History   Occupational History   Occupation: Retired  Tobacco Use   Smoking status: Former   Smokeless tobacco: Never  Building services engineer Use: Never used  Substance and Sexual Activity   Alcohol use: No   Drug use: No   Sexual activity: Not Currently

## 2022-12-02 ENCOUNTER — Ambulatory Visit: Payer: Medicare Other | Attending: Cardiology | Admitting: *Deleted

## 2022-12-02 DIAGNOSIS — Z5181 Encounter for therapeutic drug level monitoring: Secondary | ICD-10-CM | POA: Insufficient documentation

## 2022-12-02 DIAGNOSIS — I480 Paroxysmal atrial fibrillation: Secondary | ICD-10-CM | POA: Insufficient documentation

## 2022-12-02 LAB — POCT INR: INR: 2.3 (ref 2.0–3.0)

## 2022-12-02 NOTE — Patient Instructions (Signed)
Continue warfarin 1/2 tablet daily except 1 tablet on Sundays, Tuesdays and Thursdays Continue to eat 1 serving of greens/week.  Recheck in 6 weeks 

## 2022-12-20 ENCOUNTER — Ambulatory Visit: Payer: Medicare Other | Attending: Cardiology | Admitting: Cardiology

## 2022-12-20 VITALS — BP 150/75 | HR 70 | Ht 65.0 in | Wt 184.8 lb

## 2022-12-20 DIAGNOSIS — I480 Paroxysmal atrial fibrillation: Secondary | ICD-10-CM

## 2022-12-20 DIAGNOSIS — I38 Endocarditis, valve unspecified: Secondary | ICD-10-CM

## 2022-12-20 DIAGNOSIS — D6869 Other thrombophilia: Secondary | ICD-10-CM | POA: Diagnosis not present

## 2022-12-20 DIAGNOSIS — Z79899 Other long term (current) drug therapy: Secondary | ICD-10-CM

## 2022-12-20 DIAGNOSIS — I10 Essential (primary) hypertension: Secondary | ICD-10-CM | POA: Diagnosis present

## 2022-12-20 MED ORDER — SPIRONOLACTONE 25 MG PO TABS: 25.0000 mg | ORAL_TABLET | Freq: Every day | ORAL | 1 refills | Status: DC

## 2022-12-20 NOTE — Progress Notes (Signed)
Clinical Summary Ms. Royer is a 77 y.o.female seen today for follow up of the following medical problems.      1. PAF -08/2019 monitor showed episodes of PAF  DOACs were too expensive  - no recent palpitations. Compliant with meds - no bleeding on coumadin     2. Vision changes - seen in ER 08/29/19 with acute vision change - MRI without acute findings - seen by optho, diagnosed with retinal ischemia.  - may have been related to her PAF which just diagnosed by monitor.      3. Valvular heart disease - 02/2019 echo with mild to mod MR, mod TR,  10/2019 echo LVEF 55-60%, mild to mod MR, mod TR  Jan 2024 echo: LVEF 60-65%, no WMAs, grade I dd, mild MR, mild TR.    4. HTN   - had some leg swelling on amlodopine per her report - reports cough on losartan per her report - compliant with meds   - compliant with meds,reports pcp stopped chlorthalidone she is not sure the reason - home bp's usually 140s/70s  5. LE edema - takes prn lasix and swelling is controlled Past Medical History:  Diagnosis Date   Cancer (HCC)    breast    Coronary artery disease due to lipid rich plaque 04/13/2016   Diabetes mellitus without complication (HCC)    Diverticulosis of intestine without bleeding 12/13/2016   History of right mastectomy 11/15/2014   Hypothyroidism    Overactive bladder    Paget's disease of the bone    Paroxysmal atrial fibrillation (HCC) 06/19/2014   Restless leg syndrome      Allergies  Allergen Reactions   Mirapex [Pramipexole Dihydrochloride] Itching and Swelling   Penicillins Rash   Atorvastatin Other (See Comments)   Fosamax [Alendronate] Other (See Comments)   Gabapentin Other (See Comments)    Couldn't move legs during the night after taking.   Levothyroxine Itching   Symbicort [Budesonide-Formoterol Fumarate] Itching     Current Outpatient Medications  Medication Sig Dispense Refill   calcium carbonate (OSCAL) 1500 (600 Ca) MG TABS tablet Take 2  tablets by mouth daily.      furosemide (LASIX) 20 MG tablet Take 20 mg by mouth daily as needed for edema.      loratadine (CLARITIN) 10 MG tablet Take 10 mg by mouth daily.     magnesium oxide (MAG-OX) 400 (241.3 Mg) MG tablet Take 400 mg by mouth daily.      meloxicam (MOBIC) 7.5 MG tablet Take 7.5 mg by mouth as needed for pain.     metoprolol tartrate (LOPRESSOR) 50 MG tablet TAKE 1 TABLET BY MOUTH TWICE A DAY 180 tablet 2   oxybutynin (DITROPAN-XL) 10 MG 24 hr tablet TAKE 1 TABLET EVERY DAY 90 tablet 1   pantoprazole (PROTONIX) 40 MG tablet Take 40 mg by mouth daily.     SYNTHROID 112 MCG tablet Take 112 mcg by mouth daily before breakfast.      warfarin (COUMADIN) 5 MG tablet Take 1/2 to 1 tablet daily as directed by coumadin clinic 70 tablet 1   No current facility-administered medications for this visit.     Past Surgical History:  Procedure Laterality Date   ABDOMINAL HYSTERECTOMY  1999   APPENDECTOMY     as a child   CATARACT EXTRACTION Bilateral Fall 2013   Dr. Emmit Pomfret   CATARACT EXTRACTION, BILATERAL     EYE SURGERY Bilateral Fall 2013   Cat Sx -  Dr. Emmit Pomfret   MASTECTOMY Right      Allergies  Allergen Reactions   Mirapex [Pramipexole Dihydrochloride] Itching and Swelling   Penicillins Rash   Atorvastatin Other (See Comments)   Fosamax [Alendronate] Other (See Comments)   Gabapentin Other (See Comments)    Couldn't move legs during the night after taking.   Levothyroxine Itching   Symbicort [Budesonide-Formoterol Fumarate] Itching      Family History  Problem Relation Age of Onset   Breast cancer Mother    Colon cancer Father    Kidney cancer Father    Bladder Cancer Sister    Suicidality Brother    Heart attack Maternal Grandmother    Breast cancer Sister    Stomach cancer Sister    Ovarian cancer Sister    Breast cancer Sister    Breast cancer Sister    Diabetes Sister    Other Brother        parathyroid disorder; Engelmann's disease   Angelman  syndrome Son    Luiz Blare' disease Daughter      Social History Ms. Roeper reports that she has quit smoking. She has never used smokeless tobacco. Ms. Rede reports no history of alcohol use.   Review of Systems CONSTITUTIONAL: No weight loss, fever, chills, weakness or fatigue.  HEENT: Eyes: No visual loss, blurred vision, double vision or yellow sclerae.No hearing loss, sneezing, congestion, runny nose or sore throat.  SKIN: No rash or itching.  CARDIOVASCULAR: per hpi RESPIRATORY: No shortness of breath, cough or sputum.  GASTROINTESTINAL: No anorexia, nausea, vomiting or diarrhea. No abdominal pain or blood.  GENITOURINARY: No burning on urination, no polyuria NEUROLOGICAL: No headache, dizziness, syncope, paralysis, ataxia, numbness or tingling in the extremities. No change in bowel or bladder control.  MUSCULOSKELETAL: No muscle, back pain, joint pain or stiffness.  LYMPHATICS: No enlarged nodes. No history of splenectomy.  PSYCHIATRIC: No history of depression or anxiety.  ENDOCRINOLOGIC: No reports of sweating, cold or heat intolerance. No polyuria or polydipsia.  Marland Kitchen   Physical Examination Today's Vitals   12/20/22 1608  BP: (!) 160/92  Pulse: 70  SpO2: 98%  Weight: 184 lb 12.8 oz (83.8 kg)  Height: 5\' 5"  (1.651 m)   Body mass index is 30.75 kg/m.  Gen: resting comfortably, no acute distress HEENT: no scleral icterus, pupils equal round and reactive, no palptable cervical adenopathy,  CV: RRR, no m/rg, no jvd Resp: Clear to auscultation bilaterally GI: abdomen is soft, non-tender, non-distended, normal bowel sounds, no hepatosplenomegaly MSK: extremities are warm, no edema.  Skin: warm, no rash Neuro:  no focal deficits Psych: appropriate affect   Diagnostic Studies  08/2015 cath [facility] FINDINGS:  Coronary Angiography 1. Left Main - Normal 2. Left anterior descending artery - the LAD gives off a small first diagonal and a large second diagonal, normal. 3.  Diagonals - Normal 4. Left Circumflex - the circumflex gives off a large OM1, small OM 2, and moderately large OM 3, normal. 5. Obtuse Marginals - Normal 6. Right Coronary Artery - the RCA gives off the PDA and small PLV branches, normal.  There is a conus Kristalyn Bergstresser that originates on the aorta, dual lumen with the RCA. 7. Posterior Descending Artery - Normal  Additional comments on angiography: Right Dominance   1.  Normal coronary arteries. 2.  LVEDP 20 3.  RRA access.  There was spasm of the radial artery near the level of the elbow so access was changed from RRA to right femoral artery.  RFA was closed with Perclose vascular suture device. 4. No Complications.   RECOMMENDATIONS:  1. Continue workup for etiology of current symptoms. 2. Followup with Dr. Molly Maduro for with her PCP. 3. Continue risk factor modification   10/2019 echo IMPRESSIONS     1. Left ventricular ejection fraction, by estimation, is 55 to 60%. The  left ventricle has normal function. The left ventricle has no regional  wall motion abnormalities. There is mild left ventricular hypertrophy of  the basal-septal segment. Left  ventricular diastolic parameters are consistent with Grade I diastolic  dysfunction (impaired relaxation).   2. Right ventricular systolic function is low normal. The right  ventricular size is normal. There is normal pulmonary artery systolic  pressure.   3. The mitral valve is grossly normal. Mild to moderate mitral valve  regurgitation.   4. Tricuspid valve regurgitation is moderate.   5. The aortic valve is tricuspid. Aortic valve regurgitation is trivial.  No aortic stenosis is present.   6. The inferior vena cava is normal in size with greater than 50%  respiratory variability, suggesting right atrial pressure of 3 mmHg.    Jan 2024 echo  1. Left ventricular ejection fraction, by estimation, is 60 to 65%. The  left ventricle has normal function. The left ventricle has no  regional  wall motion abnormalities. Left ventricular diastolic parameters are  consistent with Grade I diastolic  dysfunction (impaired relaxation). The average left ventricular global  longitudinal strain is -22.0 %. The global longitudinal strain is normal.   2. Right ventricular systolic function is normal. The right ventricular  size is normal.   3. The mitral valve is abnormal. Mild mitral valve regurgitation. No  evidence of mitral stenosis.   4. The tricuspid valve is abnormal.   5. The aortic valve has an indeterminant number of cusps. There is mild  calcification of the aortic valve. There is mild thickening of the aortic  valve. Aortic valve regurgitation is not visualized. No aortic stenosis is  present.   6. The inferior vena cava is normal in size with greater than 50%  respiratory variability, suggesting right atrial pressure of 3 mmHg.    Assessment and Plan  1.PAF/acquired thrombophilia - confirmed diagnosis by monitor, perhaps the etiology of her retinal ischemia - eliquis and xarelto were too expensive, she is on coumadin - denies any symptoms, continue current meds   2. Valvular heart disease - mild MR and TR, continue to monitor   3. HTN - above goal -she reports pcp had stopped chlorthalidone, perhaps for rash. Most reent labs were fine did not see AKi or low K. Prior side effects to norvasc, losartan - bp above goal start aldactone 25mg  daily with bmet in 2 weeks   F/u 6 months      Antoine Poche, M.D.

## 2022-12-20 NOTE — Patient Instructions (Addendum)
Medication Instructions:   Remain off of the Chlorthalidone Begin Aldactone 25mg  daily  Continue all other medications.     Labwork:  BMET - order  Please do in 2 weeks Office will contact with results via phone, letter or mychart.     Testing/Procedures:  none  Follow-Up:  6 months   Any Other Special Instructions Will Be Listed Below (If Applicable).   If you need a refill on your cardiac medications before your next appointment, please call your pharmacy.

## 2022-12-21 ENCOUNTER — Encounter: Payer: Self-pay | Admitting: *Deleted

## 2022-12-29 ENCOUNTER — Telehealth: Payer: Self-pay | Admitting: Cardiology

## 2022-12-29 NOTE — Telephone Encounter (Signed)
Pt c/o medication issue:  1. Name of Medication: spironolactone (ALDACTONE) 25 MG tablet   2. How are you currently taking this medication (dosage and times per day)? Take 1 tablet (25 mg total) by mouth daily.  3. Are you having a reaction (difficulty breathing--STAT)? No   4. What is your medication issue? Patient says that medication makes her breast hurt

## 2022-12-29 NOTE — Telephone Encounter (Signed)
Reports right breast pain that started after starting spironolactone on 12/20/2022. Reports last dose of spironolactone was yesterday. Advised to remain off spironolactone for the next week and contact office with an update on symptoms. Advised if the right breast pain continues, she would need to contact her PCP. Denies chest pain, dizziness or sob. Verbalized understanding of plan.

## 2022-12-31 NOTE — Telephone Encounter (Signed)
Aldactone can sometimes cause breast tenderness, may take a few weeks to resolved, ok to be off for now and update [place] in 2 weeks  Dominga Ferry MD

## 2023-01-20 ENCOUNTER — Ambulatory Visit: Payer: Medicare Other | Attending: Cardiology | Admitting: *Deleted

## 2023-01-20 DIAGNOSIS — Z5181 Encounter for therapeutic drug level monitoring: Secondary | ICD-10-CM | POA: Diagnosis not present

## 2023-01-20 DIAGNOSIS — I480 Paroxysmal atrial fibrillation: Secondary | ICD-10-CM | POA: Diagnosis not present

## 2023-01-20 LAB — POCT INR: INR: 2.4 (ref 2.0–3.0)

## 2023-01-20 NOTE — Patient Instructions (Signed)
Continue warfarin 1/2 tablet daily except 1 tablet on Sundays, Tuesdays and Thursdays Continue to eat 1 serving of greens/week.  Recheck in 6 weeks 

## 2023-02-03 ENCOUNTER — Encounter: Payer: Self-pay | Admitting: Orthopaedic Surgery

## 2023-02-03 ENCOUNTER — Other Ambulatory Visit (INDEPENDENT_AMBULATORY_CARE_PROVIDER_SITE_OTHER): Payer: Medicare Other

## 2023-02-03 ENCOUNTER — Ambulatory Visit (INDEPENDENT_AMBULATORY_CARE_PROVIDER_SITE_OTHER): Payer: Medicare Other | Admitting: Orthopaedic Surgery

## 2023-02-03 VITALS — Ht 65.0 in | Wt 183.3 lb

## 2023-02-03 DIAGNOSIS — M25571 Pain in right ankle and joints of right foot: Secondary | ICD-10-CM | POA: Diagnosis not present

## 2023-02-03 NOTE — Progress Notes (Signed)
My foot hurts.  She has long term chronic pain of the right foot.  She has seen Dr. Ophelia Charter for this.  He had recommended surgery several years ago and she declined.  She has pain in the midfoot area.  She has tried various shoes with no help.  She has pain more laterally and anteriorly.  She has no trauma, no redness, no swelling, no numbness.  Right foot is tender in the dorsal mid foot.  She has no redness.  NV intact. ROM of the ankle is good.  She has a slight right limp.  X-rays were done of the right foot and ankle, reported separately.  Encounter Diagnosis  Name Primary?   Pain in right ankle and joints of right foot Yes   I feel she would benefit from orthotic.  I will have her seen by podiatry.  Referral will be made.  She is agreeable to this.  Call if any problem.  Precautions discussed.  Electronically Signed Darreld Mclean, MD 8/29/202411:32 AM

## 2023-02-06 ENCOUNTER — Other Ambulatory Visit: Payer: Self-pay | Admitting: Cardiology

## 2023-02-06 DIAGNOSIS — I480 Paroxysmal atrial fibrillation: Secondary | ICD-10-CM

## 2023-02-08 NOTE — Telephone Encounter (Signed)
Refill request for warfarin:  Last INR was 2.4 on 01/20/23 Next INR due 03/03/23 LOV was 12/20/22  Refill approved.

## 2023-02-18 ENCOUNTER — Ambulatory Visit: Payer: Medicare Other | Admitting: Podiatry

## 2023-03-03 ENCOUNTER — Ambulatory Visit: Payer: Medicare Other | Attending: Cardiology | Admitting: *Deleted

## 2023-03-03 DIAGNOSIS — Z5181 Encounter for therapeutic drug level monitoring: Secondary | ICD-10-CM | POA: Insufficient documentation

## 2023-03-03 DIAGNOSIS — I480 Paroxysmal atrial fibrillation: Secondary | ICD-10-CM | POA: Diagnosis present

## 2023-03-03 LAB — POCT INR: INR: 2.6 (ref 2.0–3.0)

## 2023-03-03 NOTE — Patient Instructions (Signed)
Continue warfarin 1/2 tablet daily except 1 tablet on Sundays, Tuesdays and Thursdays Continue to eat 1 serving of greens/week.  Recheck in 6 weeks

## 2023-03-18 ENCOUNTER — Encounter (INDEPENDENT_AMBULATORY_CARE_PROVIDER_SITE_OTHER): Payer: Medicare Other | Admitting: Ophthalmology

## 2023-04-14 ENCOUNTER — Ambulatory Visit: Payer: Medicare Other | Attending: Cardiology | Admitting: *Deleted

## 2023-04-14 DIAGNOSIS — Z5181 Encounter for therapeutic drug level monitoring: Secondary | ICD-10-CM | POA: Insufficient documentation

## 2023-04-14 DIAGNOSIS — I480 Paroxysmal atrial fibrillation: Secondary | ICD-10-CM | POA: Insufficient documentation

## 2023-04-14 LAB — POCT INR: INR: 2.5 (ref 2.0–3.0)

## 2023-04-14 NOTE — Patient Instructions (Signed)
Continue warfarin 1/2 tablet daily except 1 tablet on Sundays, Tuesdays and Thursdays Continue to eat 1 serving of greens/week.  Recheck in 6 weeks 

## 2023-05-22 ENCOUNTER — Other Ambulatory Visit: Payer: Self-pay | Admitting: Cardiology

## 2023-05-26 ENCOUNTER — Ambulatory Visit: Payer: Medicare Other | Attending: Cardiology | Admitting: *Deleted

## 2023-05-26 DIAGNOSIS — Z5181 Encounter for therapeutic drug level monitoring: Secondary | ICD-10-CM | POA: Insufficient documentation

## 2023-05-26 DIAGNOSIS — I480 Paroxysmal atrial fibrillation: Secondary | ICD-10-CM | POA: Insufficient documentation

## 2023-05-26 LAB — POCT INR: INR: 2.7 (ref 2.0–3.0)

## 2023-05-26 NOTE — Patient Instructions (Signed)
Continue warfarin 1/2 tablet daily except 1 tablet on Sundays, Tuesdays and Thursdays Continue to eat 1 serving of greens/week.  Recheck in 6 weeks 

## 2023-06-18 ENCOUNTER — Other Ambulatory Visit: Payer: Self-pay | Admitting: Cardiology

## 2023-06-29 ENCOUNTER — Ambulatory Visit (INDEPENDENT_AMBULATORY_CARE_PROVIDER_SITE_OTHER): Payer: Medicare Other | Admitting: Orthopaedic Surgery

## 2023-06-29 ENCOUNTER — Encounter: Payer: Self-pay | Admitting: Orthopaedic Surgery

## 2023-06-29 VITALS — Ht 65.0 in | Wt 185.0 lb

## 2023-06-29 DIAGNOSIS — M7541 Impingement syndrome of right shoulder: Secondary | ICD-10-CM

## 2023-06-29 DIAGNOSIS — M47812 Spondylosis without myelopathy or radiculopathy, cervical region: Secondary | ICD-10-CM | POA: Diagnosis not present

## 2023-06-29 MED ORDER — BUPIVACAINE HCL 0.25 % IJ SOLN
4.0000 mL | INTRAMUSCULAR | Status: AC | PRN
  Administered 2023-06-29: 4 mL via INTRA_ARTICULAR

## 2023-06-29 MED ORDER — LIDOCAINE HCL 1 % IJ SOLN
0.5000 mL | INTRAMUSCULAR | Status: AC | PRN
  Administered 2023-06-29: .5 mL

## 2023-06-29 MED ORDER — METHYLPREDNISOLONE ACETATE 40 MG/ML IJ SUSP
40.0000 mg | INTRAMUSCULAR | Status: AC | PRN
  Administered 2023-06-29: 40 mg via INTRA_ARTICULAR

## 2023-06-29 NOTE — Progress Notes (Signed)
Office Visit Note   Patient: Desiree Bowers           Date of Birth: 12-16-1945           MRN: [id number] Visit Date: 06/29/2023              Requested by: Kirstie Peri, MD [address] [place],  [place] [address] PCP: Kirstie Peri, MD   Assessment & Plan: Visit Diagnoses:  1. Impingement syndrome of right shoulder   2. Spondylosis without myelopathy or radiculopathy, cervical region     Plan: Patient will return if she has ongoing problems.  She watch her sugars carefully postinjection.  Follow-Up Instructions: No follow-ups on file.   Orders:  Orders Placed This Encounter  Procedures   Large Joint Inj   No orders of the defined types were placed in this encounter.     Procedures: Large Joint Inj: R subacromial bursa on 06/29/2023 12:23 PM Indications: pain Details: 22 G 1.5 in needle  Arthrogram: No  Medications: 4 mL bupivacaine 0.25 %; 40 mg methylPREDNISolone acetate 40 MG/ML; 0.5 mL lidocaine 1 % Outcome: tolerated well, no immediate complications Procedure, treatment alternatives, risks and benefits explained, specific risks discussed. Consent was given by the patient. Immediately prior to procedure a time out was called to verify the correct patient, procedure, equipment, support staff and site/side marked as required. Patient was prepped and draped in the usual sterile fashion.       Clinical Data: No additional findings.   Subjective: Chief Complaint  Patient presents with   Neck - Pain   Lower Back - Pain    HPI 78 year old female returns with recurrent neck and right shoulder pain.  Previous injection 10/07/2022 gave her excellent relief for several months.  She has known cervical spondylosis has some pain posteriorly that radiates into the base of her skull.  Problems in the past with outstretched reaching.  She states the pain keeps her up at night.  No numbness or tingling in her hand with some pain radiates just past the elbow at times on the right  side.  No current symptoms in the left upper extremity no myelopathic symptoms.  Review of Systems 14 point system update unchanged from 10/07/2022 other than as mentioned in HPI.   Objective: Vital Signs: Ht 5\' 5"  (1.651 m)   Wt 185 lb (83.9 kg)   BMI 30.79 kg/m   Physical Exam Constitutional:      Appearance: She is well-developed.  HENT:     Head: Normocephalic.     Right Ear: External ear normal.     Left Ear: External ear normal. There is no impacted cerumen.  Eyes:     Pupils: Pupils are equal, round, and reactive to light.  Neck:     Thyroid: No thyromegaly.     Trachea: No tracheal deviation.  Cardiovascular:     Rate and Rhythm: Normal rate.  Pulmonary:     Effort: Pulmonary effort is normal.  Abdominal:     Palpations: Abdomen is soft.  Musculoskeletal:     Cervical back: No rigidity.  Skin:    General: Skin is warm and dry.  Neurological:     Mental Status: She is alert and oriented to person, place, and time.  Psychiatric:        Behavior: Behavior normal.     Ortho Exam positive impingement right shoulder negative drop arm test subscap is strong.  Some brachioplexus tenderness right greater than left some tenderness of  the trapezius and supraspinatus fossa without atrophy.  Specialty Comments:  No specialty comments available.  Imaging: No results found.   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) 04/13/2016   Primary osteoarthritis  involving multiple joints 11/15/2014   Acquired hypothyroidism 11/15/2014   Gastroesophageal reflux disease without esophagitis 04/04/2014   GAD (generalized anxiety disorder) 04/04/2014   ANA positive 01/18/2014   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   Essential (primary) hypertension 09/24/2013   Past Medical History:  Diagnosis Date   Cancer (HCC)    breast    Coronary artery disease due to lipid rich plaque 04/13/2016   Diabetes mellitus without complication (HCC)    Diverticulosis of intestine without bleeding 12/13/2016   History of right mastectomy 11/15/2014   Hypothyroidism    Overactive bladder    Paget's disease of the bone    Paroxysmal atrial fibrillation (HCC) 06/19/2014   Restless leg syndrome     Family History  Problem Relation Age of Onset   Breast cancer Mother    Colon cancer Father    Kidney cancer Father    Bladder Cancer Sister    Suicidality Brother    Heart attack Maternal Grandmother    Breast cancer Sister    Stomach cancer Sister    Ovarian cancer Sister    Breast cancer Sister    Breast cancer Sister    Diabetes Sister    Other Brother        parathyroid disorder; Engelmann's disease   Angelman syndrome Son    Luiz Blare' disease Daughter     Past Surgical History:  Procedure Laterality Date   ABDOMINAL HYSTERECTOMY  1999   APPENDECTOMY     as a child   CATARACT EXTRACTION Bilateral Fall 2013   Dr. Emmit Pomfret   CATARACT EXTRACTION, BILATERAL     EYE SURGERY Bilateral Fall 2013   Cat Sx - Dr. Emmit Pomfret   MASTECTOMY Right    Social History   Occupational History   Occupation: Retired  Tobacco Use   Smoking status: Former   Smokeless tobacco: Never  Advertising account planner   Vaping status: Never Used  Substance and Sexual Activity   Alcohol use: No   Drug use: No   Sexual activity: Not Currently

## 2023-07-07 ENCOUNTER — Ambulatory Visit: Payer: Medicare Other | Attending: Cardiology | Admitting: *Deleted

## 2023-07-07 DIAGNOSIS — I480 Paroxysmal atrial fibrillation: Secondary | ICD-10-CM | POA: Diagnosis present

## 2023-07-07 DIAGNOSIS — Z5181 Encounter for therapeutic drug level monitoring: Secondary | ICD-10-CM | POA: Insufficient documentation

## 2023-07-07 LAB — POCT INR: INR: 2.1 (ref 2.0–3.0)

## 2023-07-07 NOTE — Patient Instructions (Signed)
Continue warfarin 1/2 tablet daily except 1 tablet on Sundays, Tuesdays and Thursdays Continue to eat 1 serving of greens/week.  Recheck in 6 weeks

## 2023-07-24 ENCOUNTER — Encounter (HOSPITAL_COMMUNITY): Payer: Self-pay | Admitting: *Deleted

## 2023-07-24 ENCOUNTER — Inpatient Hospital Stay (HOSPITAL_COMMUNITY)
Admission: EM | Admit: 2023-07-24 | Discharge: 2023-07-29 | DRG: 286 | Disposition: A | Discharge status: Home / Self Care | Payer: Medicare Other | Attending: Internal Medicine | Admitting: Internal Medicine

## 2023-07-24 ENCOUNTER — Other Ambulatory Visit: Payer: Self-pay

## 2023-07-24 ENCOUNTER — Emergency Department (HOSPITAL_COMMUNITY): Payer: Medicare Other

## 2023-07-24 DIAGNOSIS — R053 Chronic cough: Secondary | ICD-10-CM | POA: Diagnosis not present

## 2023-07-24 DIAGNOSIS — I2583 Coronary atherosclerosis due to lipid rich plaque: Secondary | ICD-10-CM | POA: Diagnosis present

## 2023-07-24 DIAGNOSIS — I081 Rheumatic disorders of both mitral and tricuspid valves: Secondary | ICD-10-CM | POA: Diagnosis present

## 2023-07-24 DIAGNOSIS — E039 Hypothyroidism, unspecified: Secondary | ICD-10-CM | POA: Diagnosis not present

## 2023-07-24 DIAGNOSIS — R791 Abnormal coagulation profile: Secondary | ICD-10-CM | POA: Diagnosis present

## 2023-07-24 DIAGNOSIS — Z888 Allergy status to other drugs, medicaments and biological substances status: Secondary | ICD-10-CM

## 2023-07-24 DIAGNOSIS — Z800 Family history of malignant neoplasm of digestive organs: Secondary | ICD-10-CM

## 2023-07-24 DIAGNOSIS — Z833 Family history of diabetes mellitus: Secondary | ICD-10-CM

## 2023-07-24 DIAGNOSIS — I110 Hypertensive heart disease with heart failure: Secondary | ICD-10-CM | POA: Diagnosis not present

## 2023-07-24 DIAGNOSIS — Z8052 Family history of malignant neoplasm of bladder: Secondary | ICD-10-CM

## 2023-07-24 DIAGNOSIS — I429 Cardiomyopathy, unspecified: Secondary | ICD-10-CM

## 2023-07-24 DIAGNOSIS — I5021 Acute systolic (congestive) heart failure: Secondary | ICD-10-CM | POA: Diagnosis present

## 2023-07-24 DIAGNOSIS — Z853 Personal history of malignant neoplasm of breast: Secondary | ICD-10-CM

## 2023-07-24 DIAGNOSIS — F411 Generalized anxiety disorder: Secondary | ICD-10-CM | POA: Diagnosis present

## 2023-07-24 DIAGNOSIS — Z8051 Family history of malignant neoplasm of kidney: Secondary | ICD-10-CM

## 2023-07-24 DIAGNOSIS — E785 Hyperlipidemia, unspecified: Secondary | ICD-10-CM | POA: Diagnosis present

## 2023-07-24 DIAGNOSIS — M1990 Unspecified osteoarthritis, unspecified site: Secondary | ICD-10-CM | POA: Diagnosis present

## 2023-07-24 DIAGNOSIS — Z79899 Other long term (current) drug therapy: Secondary | ICD-10-CM

## 2023-07-24 DIAGNOSIS — E1169 Type 2 diabetes mellitus with other specified complication: Secondary | ICD-10-CM | POA: Diagnosis not present

## 2023-07-24 DIAGNOSIS — I10 Essential (primary) hypertension: Secondary | ICD-10-CM | POA: Diagnosis present

## 2023-07-24 DIAGNOSIS — I480 Paroxysmal atrial fibrillation: Secondary | ICD-10-CM | POA: Diagnosis present

## 2023-07-24 DIAGNOSIS — I8390 Asymptomatic varicose veins of unspecified lower extremity: Secondary | ICD-10-CM | POA: Diagnosis present

## 2023-07-24 DIAGNOSIS — G459 Transient cerebral ischemic attack, unspecified: Principal | ICD-10-CM | POA: Diagnosis present

## 2023-07-24 DIAGNOSIS — I2510 Atherosclerotic heart disease of native coronary artery without angina pectoris: Secondary | ICD-10-CM | POA: Diagnosis present

## 2023-07-24 DIAGNOSIS — Z1152 Encounter for screening for COVID-19: Secondary | ICD-10-CM

## 2023-07-24 DIAGNOSIS — Z9181 History of falling: Secondary | ICD-10-CM

## 2023-07-24 DIAGNOSIS — G2581 Restless legs syndrome: Secondary | ICD-10-CM | POA: Diagnosis present

## 2023-07-24 DIAGNOSIS — Z79890 Hormone replacement therapy (postmenopausal): Secondary | ICD-10-CM

## 2023-07-24 DIAGNOSIS — Z8673 Personal history of transient ischemic attack (TIA), and cerebral infarction without residual deficits: Secondary | ICD-10-CM

## 2023-07-24 DIAGNOSIS — I272 Pulmonary hypertension, unspecified: Secondary | ICD-10-CM | POA: Diagnosis present

## 2023-07-24 DIAGNOSIS — Z8249 Family history of ischemic heart disease and other diseases of the circulatory system: Secondary | ICD-10-CM

## 2023-07-24 DIAGNOSIS — Z880 Allergy status to penicillin: Secondary | ICD-10-CM

## 2023-07-24 DIAGNOSIS — R270 Ataxia, unspecified: Secondary | ICD-10-CM | POA: Diagnosis present

## 2023-07-24 DIAGNOSIS — Z7982 Long term (current) use of aspirin: Secondary | ICD-10-CM

## 2023-07-24 DIAGNOSIS — Z9011 Acquired absence of right breast and nipple: Secondary | ICD-10-CM

## 2023-07-24 DIAGNOSIS — Z8041 Family history of malignant neoplasm of ovary: Secondary | ICD-10-CM

## 2023-07-24 DIAGNOSIS — Z87891 Personal history of nicotine dependence: Secondary | ICD-10-CM

## 2023-07-24 DIAGNOSIS — Z7901 Long term (current) use of anticoagulants: Secondary | ICD-10-CM

## 2023-07-24 DIAGNOSIS — I2489 Other forms of acute ischemic heart disease: Secondary | ICD-10-CM | POA: Diagnosis present

## 2023-07-24 DIAGNOSIS — Z803 Family history of malignant neoplasm of breast: Secondary | ICD-10-CM

## 2023-07-24 LAB — BASIC METABOLIC PANEL
Anion gap: 8 (ref 5–15)
BUN: 16 mg/dL (ref 8–23)
CO2: 27 mmol/L (ref 22–32)
Calcium: 9 mg/dL (ref 8.9–10.3)
Chloride: 102 mmol/L (ref 98–111)
Creatinine, Ser: 0.71 mg/dL (ref 0.44–1.00)
GFR, Estimated: >60 mL/min (ref >60)
Glucose, Bld: 155 mg/dL — ABNORMAL HIGH (ref 70–99)
Potassium: 3.8 mmol/L (ref 3.5–5.1)
Sodium: 137 mmol/L (ref 135–145)

## 2023-07-24 LAB — CBC WITH DIFFERENTIAL/PLATELET
Abs Immature Granulocytes: 0.01 10*3/uL (ref 0.00–0.07)
Basophils Absolute: 0.1 10*3/uL (ref 0.0–0.1)
Basophils Relative: 1 %
Eosinophils Absolute: 0.1 10*3/uL (ref 0.0–0.5)
Eosinophils Relative: 2 %
HCT: 35.5 % — ABNORMAL LOW (ref 36.0–46.0)
Hemoglobin: 10.4 g/dL — ABNORMAL LOW (ref 12.0–15.0)
Immature Granulocytes: 0 %
Lymphocytes Relative: 37 %
Lymphs Abs: 2 10*3/uL (ref 0.7–4.0)
MCH: 24.1 pg — ABNORMAL LOW (ref 26.0–34.0)
MCHC: 29.3 g/dL — ABNORMAL LOW (ref 30.0–36.0)
MCV: 82.2 fL (ref 80.0–100.0)
Monocytes Absolute: 0.4 10*3/uL (ref 0.1–1.0)
Monocytes Relative: 8 %
Neutro Abs: 2.8 10*3/uL (ref 1.7–7.7)
Neutrophils Relative %: 52 %
Platelets: 403 10*3/uL — ABNORMAL HIGH (ref 150–400)
RBC: 4.32 MIL/uL (ref 3.87–5.11)
RDW: 21.7 % — ABNORMAL HIGH (ref 11.5–15.5)
WBC: 5.4 10*3/uL (ref 4.0–10.5)
nRBC: 0 % (ref 0.0–0.2)

## 2023-07-24 LAB — BRAIN NATRIURETIC PEPTIDE: B Natriuretic Peptide: 770 pg/mL — ABNORMAL HIGH (ref 0.0–100.0)

## 2023-07-24 LAB — PROTIME-INR
INR: 1.5 — ABNORMAL HIGH (ref 0.8–1.2)
Prothrombin Time: 18.6 s — ABNORMAL HIGH (ref 11.4–15.2)

## 2023-07-24 LAB — RESP PANEL BY RT-PCR (RSV, FLU A&B, COVID)  RVPGX2
Influenza A by PCR: NEGATIVE
Influenza B by PCR: NEGATIVE
Resp Syncytial Virus by PCR: NEGATIVE
SARS Coronavirus 2 by RT PCR: NEGATIVE

## 2023-07-24 LAB — GLUCOSE, CAPILLARY: Glucose-Capillary: 105 mg/dL — ABNORMAL HIGH (ref 70–99)

## 2023-07-24 LAB — TROPONIN I (HIGH SENSITIVITY)
Troponin I (High Sensitivity): 18 ng/L — ABNORMAL HIGH (ref <18)
Troponin I (High Sensitivity): 18 ng/L — ABNORMAL HIGH (ref <18)

## 2023-07-24 LAB — D-DIMER, QUANTITATIVE: D-Dimer, Quant: <0.27 ug{FEU}/mL (ref 0.00–0.50)

## 2023-07-24 MED ORDER — ENOXAPARIN SODIUM 40 MG/0.4ML IJ SOSY
40.0000 mg | PREFILLED_SYRINGE | INTRAMUSCULAR | Status: DC
  Administered 2023-07-25 – 2023-07-26 (×2): 40 mg via SUBCUTANEOUS
  Filled 2023-07-24 (×2): qty 0.4

## 2023-07-24 MED ORDER — PANTOPRAZOLE SODIUM 40 MG PO TBEC
40.0000 mg | DELAYED_RELEASE_TABLET | Freq: Every day | ORAL | Status: DC
  Administered 2023-07-25 – 2023-07-29 (×5): 40 mg via ORAL
  Filled 2023-07-24 (×5): qty 1

## 2023-07-24 MED ORDER — INSULIN ASPART 100 UNIT/ML IJ SOLN
0.0000 [IU] | Freq: Three times a day (TID) | INTRAMUSCULAR | Status: DC
  Administered 2023-07-26 – 2023-07-29 (×3): 1 [IU] via SUBCUTANEOUS

## 2023-07-24 MED ORDER — PREDNISONE 20 MG PO TABS: 40.0000 mg | ORAL_TABLET | Freq: Once | ORAL | Status: DC

## 2023-07-24 MED ORDER — ACETAMINOPHEN 650 MG RE SUPP: 650.0000 mg | RECTAL | Status: DC | PRN

## 2023-07-24 MED ORDER — LEVOTHYROXINE SODIUM 112 MCG PO TABS
112.0000 ug | ORAL_TABLET | Freq: Every day | ORAL | Status: DC
  Administered 2023-07-25 – 2023-07-29 (×5): 112 ug via ORAL
  Filled 2023-07-24 (×5): qty 1

## 2023-07-24 MED ORDER — METOPROLOL TARTRATE 50 MG PO TABS
50.0000 mg | ORAL_TABLET | Freq: Two times a day (BID) | ORAL | Status: AC
  Administered 2023-07-24 – 2023-07-27 (×7): 50 mg via ORAL
  Filled 2023-07-24 (×7): qty 1

## 2023-07-24 MED ORDER — LABETALOL HCL 5 MG/ML IV SOLN: 10.0000 mg | INTRAVENOUS | Status: DC | PRN

## 2023-07-24 MED ORDER — ACETAMINOPHEN 325 MG PO TABS
650.0000 mg | ORAL_TABLET | ORAL | Status: DC | PRN
  Administered 2023-07-24 – 2023-07-28 (×7): 650 mg via ORAL
  Filled 2023-07-24 (×6): qty 2

## 2023-07-24 MED ORDER — INSULIN ASPART 100 UNIT/ML IJ SOLN: 0.0000 [IU] | Freq: Every day | INTRAMUSCULAR | Status: DC

## 2023-07-24 MED ORDER — ACETAMINOPHEN 160 MG/5ML PO SOLN: 650.0000 mg | ORAL | Status: DC | PRN

## 2023-07-24 MED ORDER — WARFARIN - PHARMACIST DOSING INPATIENT: Freq: Every day | Status: DC

## 2023-07-24 MED ORDER — SENNOSIDES-DOCUSATE SODIUM 8.6-50 MG PO TABS: 1.0000 | ORAL_TABLET | Freq: Every evening | ORAL | Status: DC | PRN

## 2023-07-24 MED ORDER — IPRATROPIUM-ALBUTEROL 0.5-2.5 (3) MG/3ML IN SOLN: 3.0000 mL | Freq: Once | RESPIRATORY_TRACT | Status: DC

## 2023-07-24 MED ORDER — MECLIZINE HCL 12.5 MG PO TABS
25.0000 mg | ORAL_TABLET | Freq: Once | ORAL | Status: AC
  Administered 2023-07-24: 25 mg via ORAL
  Filled 2023-07-24: qty 2

## 2023-07-24 MED ORDER — STROKE: EARLY STAGES OF RECOVERY BOOK: Freq: Once | Status: AC

## 2023-07-24 NOTE — Assessment & Plan Note (Addendum)
 Chronic unchanged cough.  Also reports episode of dyspnea about 6 weeks ago when she bends over, since then has not been back to baseline.  No signs of volume overload.  Weights have per chart stable to improved over the past. Chest x-ray clear.  BNP elevated at 770- no prior to compare.  On Coumadin and has had therapeutic INR levels up until today. - F/u Echo

## 2023-07-24 NOTE — Assessment & Plan Note (Signed)
 Elevated. -Resume metoprolol 50 daily -As needed labetalol 10mg  for systolic greater than 170

## 2023-07-24 NOTE — Progress Notes (Signed)
 PHARMACY - ANTICOAGULATION CONSULT NOTE  Pharmacy Consult for PTA warfarin Indication: atrial fibrillation  Allergies  Allergen Reactions   Mirapex [Pramipexole Dihydrochloride] Itching and Swelling   Penicillins Rash   Atorvastatin Other (See Comments)   Fosamax [Alendronate] Other (See Comments)   Gabapentin Other (See Comments)    Couldn't move legs during the night after taking.   Levothyroxine Itching   Symbicort [Budesonide-Formoterol Fumarate] Itching    Patient Measurements: Height: 5\' 5"  (165.1 cm) Weight: 82.2 kg (181 lb 3.5 oz) IBW/kg (Calculated) : 57  Vital Signs: Temp: 98.2 F (36.8 C) (02/16 2143) Temp Source: Oral (02/16 2143) BP: 158/75 (02/16 2143) Pulse Rate: 81 (02/16 2143)  Labs: Recent Labs    07/24/23 1126 07/24/23 1222 07/24/23 1357  HGB 10.4*  --   --   HCT 35.5*  --   --   PLT 403*  --   --   LABPROT 18.6*  --   --   INR 1.5*  --   --   CREATININE 0.71  --   --   TROPONINIHS  --  18* 18*    Estimated Creatinine Clearance: 62.4 mL/min (by C-G formula based on SCr of 0.71 mg/dL).   Medical History: Past Medical History:  Diagnosis Date   Cancer (HCC)    breast    Coronary artery disease due to lipid rich plaque 04/13/2016   Diabetes mellitus without complication (HCC)    Diverticulosis of intestine without bleeding 12/13/2016   History of right mastectomy 11/15/2014   Hypothyroidism    Overactive bladder    Paget's disease of the bone    Paroxysmal atrial fibrillation (HCC) 06/19/2014   Restless leg syndrome     Medications:  Warfarin 5mg  PO every Sunday, Tuesday, Thursday and take 2.5 mg PO all other days  Assessment: Desiree Bowers presented to ED with difficulty walking/fall and concerns for TIA. PMH includes: diabetes mellitus, hypertension, paroxysmal atrial fibrillation. Pharmacy consulted to continue to dose warfarin.  -Hgb 10.4, plts 403 -INR 1.5 (subtherapeutic) -Last dose of warfarin taken on: 5 mg PO x1 on 2/16 AM  Goal of  Therapy:  INR 2-3 Monitor platelets by anticoagulation protocol: Yes   Plan:  -Follow up 2/17 AM INR and dose warfarin to assess 2/17 dose of warfarin -Daily PT/INR   Arabella Merles, PharmD. Clinical Pharmacist 07/24/2023 10:44 PM

## 2023-07-24 NOTE — Assessment & Plan Note (Addendum)
 Rate controlled.  Currently in sinus rhythm.  INR subtherapeutic at 1.5.  She is not sure if she ate more vegetables than she should have this past week.  She is on Coumadin due to cost of DOAC's. -Pharmacy consult for subtherapeutic INR -Resume metoprolol

## 2023-07-24 NOTE — Assessment & Plan Note (Addendum)
-   HgbA1c -Not on Medications -SSI- s

## 2023-07-24 NOTE — ED Provider Notes (Signed)
 [place] EMERGENCY DEPARTMENT AT [facility] Provider Note   CSN: [id number] Arrival date & time: 07/24/23  1043     History  Chief Complaint  Patient presents with   Abnormal Lab    Desiree Bowers is a 78 y.o. female.  She has PMH of A-fib on Coumadin, type 2 diabetes, hypothyroidism, osteoarthritis.  Presents to ER today for 2 complaints.  First she states she has been off balance today, does not feel dizzy but when she stands to try to walk she is not able to walk in a straight line, and this caused her to fall today, fell on her left side, no LOC, denies pain but states she was truly unable to walk due to the inability to balance.  She denies any numbness tingling or weakness, no difficulties with speech or word finding, no loss of vision or blurry vision, she states she been having posterior headache for a long time but no change in this.  She reports some generalized weakness but states this is normal for her.  Second she reports that she has been short of breath for about 5 to 6 weeks, worse with exertion, no chest pain, has been having chronic cough with this as well.  She saw her PCP and she states they told her her proBNP was high and they were going to refer her to cardiology to see if this was related to her chronic cough.   Abnormal Lab      Home Medications Prior to Admission medications   Medication Sig Start Date End Date Taking? Authorizing Provider  calcium carbonate (OSCAL) 1500 (600 Ca) MG TABS tablet Take 2 tablets by mouth daily.     [provider]  furosemide (LASIX) 20 MG tablet Take 20 mg by mouth daily as needed for edema.  02/16/17   [provider]  loratadine (CLARITIN) 10 MG tablet Take 10 mg by mouth daily.    [provider]  magnesium oxide (MAG-OX) 400 (241.3 Mg) MG tablet Take 400 mg by mouth daily.     [provider]  meloxicam (MOBIC) 7.5 MG tablet Take 7.5 mg by mouth as needed for pain.     [provider]  metoprolol tartrate (LOPRESSOR) 50 MG tablet TAKE 1 TABLET BY MOUTH TWICE A DAY 05/23/23   Antoine Poche, MD  oxybutynin (DITROPAN-XL) 10 MG 24 hr tablet TAKE 1 TABLET EVERY DAY 01/28/20   Gwenlyn Fudge, FNP  pantoprazole (PROTONIX) 40 MG tablet Take 40 mg by mouth daily. 03/02/21   [provider]  SYNTHROID 112 MCG tablet Take 112 mcg by mouth daily before breakfast.  04/21/17   [provider]  warfarin (COUMADIN) 5 MG tablet TAKE 1/2 TO 1 TABLET DAILY AS DIRECTED BY COUMADIN CLINIC *[facility] ONLY* 02/08/23   Antoine Poche, MD      Allergies    Mirapex [pramipexole dihydrochloride], Penicillins, Atorvastatin, Fosamax [alendronate], Gabapentin, Levothyroxine, and Symbicort [budesonide-formoterol fumarate]    Review of Systems   Review of Systems  Physical Exam Updated Vital Signs BP (!) 158/66 (BP Location: Left Arm)   Pulse 76   Temp (!) 97.5 F (36.4 C) (Oral)   Resp 20   Ht 5\' 5"  (1.651 m)   Wt 83 kg   SpO2 99%   BMI 30.45 kg/m  Physical Exam Vitals and nursing note reviewed.  Constitutional:      General: She is not in acute distress.    Appearance: She is  well-developed.  HENT:     Head: Normocephalic and atraumatic.     Mouth/Throat:     Mouth: Mucous membranes are moist.  Eyes:     Extraocular Movements: Extraocular movements intact.     Conjunctiva/sclera: Conjunctivae normal.     Pupils: Pupils are equal, round, and reactive to light.  Cardiovascular:     Rate and Rhythm: Normal rate and regular rhythm.     Heart sounds: No murmur heard. Pulmonary:     Effort: Pulmonary effort is normal. No respiratory distress.     Breath sounds: Normal breath sounds.  Abdominal:     Palpations: Abdomen is soft.     Tenderness: There is no abdominal tenderness.  Musculoskeletal:        General: No swelling.     Cervical back: Neck supple.  Skin:    General: Skin is warm and dry.     Capillary Refill: Capillary refill  takes less than 2 seconds.  Neurological:     General: No focal deficit present.     Mental Status: She is alert and oriented to person, place, and time.     GCS: GCS eye subscore is 4. GCS verbal subscore is 5. GCS motor subscore is 6.     Sensory: Sensation is intact.     Motor: No weakness, tremor or abnormal muscle tone.     Coordination: Finger-Nose-Finger Test and Heel to [employer] normal.  Psychiatric:        Mood and Affect: Mood normal.     ED Results / Procedures / Treatments   Labs (all labs ordered are listed, but only abnormal results are displayed) Labs Reviewed  CBC WITH DIFFERENTIAL/PLATELET - Abnormal; Notable for the following components:      Result Value   Hemoglobin 10.4 (*)    HCT 35.5 (*)    MCH 24.1 (*)    MCHC 29.3 (*)    RDW 21.7 (*)    Platelets 403 (*)    All other components within normal limits  BASIC METABOLIC PANEL - Abnormal; Notable for the following components:   Glucose, Bld 155 (*)    All other components within normal limits  PROTIME-INR - Abnormal; Notable for the following components:   Prothrombin Time 18.6 (*)    INR 1.5 (*)    All other components within normal limits    EKG None  Radiology DG Chest Portable 1 View Result Date: 07/24/2023 CLINICAL DATA:  Shortness of breath EXAM: PORTABLE CHEST 1 VIEW COMPARISON:  Chest radiograph 07/07/2023 and chest CT from 07/10/2019 FINDINGS: Mild cardiac enlargement. Aortic atherosclerosis. No pleural fluid, interstitial edema or airspace disease. Increased sclerosis involving the right humeral head, right clavicle and right scapula. This appears unchanged from the prior exam. These findings are better assessed on prior CT from 07/10/2019. IMPRESSION: 1. No acute cardiopulmonary abnormalities. 2. Mild cardiac enlargement. 3. Unchanged increased sclerosis involving the right humeral head, right clavicle and right scapula. Electronically Signed   By: Signa Kell M.D.   On: 07/24/2023 11:45     Procedures Procedures    Medications Ordered in ED Medications - No data to display  ED Course/ Medical Decision Making/ A&P                                 Medical Decision Making This patient presents to the ED for concern of feeling off balance, also having ongoing shortness of breath,  this involves an extensive number of treatment options, and is a complaint that carries with it a high risk of complications and morbidity.  The differential diagnosis includes COVID, CVA, TIA, other   Co morbidities that complicate the patient evaluation :   Atrial fibrillation-patient anticoagulated with Coumadin due to cost of DOAC's   Additional history obtained:  Additional history obtained from EMR External records from outside source obtained and reviewed including prior notes and labs   Lab Tests:  I Ordered, and personally interpreted labs.  The pertinent results include: CBC shows hemoglobin of 10.4, troponin delta of 0, D-dimer negative, negative COVID flu RSV, BNP elevated at 770, BMP reassuring, INR subtherapeutic at 1.5   Imaging Studies ordered:  I ordered imaging studies including CT head and C-spine which shows no intracranial hemorrhage, no acute findings, no C-spine fracture; chest x-ray shows no pulmonary edema or infiltrate; MRI brain shows no acute CVA I independently visualized and interpreted imaging within scope of identifying emergent findings  I agree with the radiologist interpretation   Cardiac Monitoring: / EKG:  The patient was maintained on a cardiac monitor.  I personally viewed and interpreted the cardiac monitored which showed an underlying rhythm of: Sinus rhythm   Consultations Obtained:  I requested consultation with the hospitalist Dr. Mariea Clonts,  and discussed lab and imaging findings as well as pertinent plan - they recommend: Consult with neuro to see if patient needs admission versus outpatient workup  Consult with neurology and spoke with  Dr. Selina Cooley who recommended admission for TIA workup due to ataxia that has resolved, especially concerning that patient's INR is subtherapeutic and would like this to be corrected, hospitalist can consult teleneurology routinely as well.   Problem List / ED Course / Critical interventions / Medication management  Ataxia-patient unable to walk this morning, felt like she was drunk, had a fall but denies any significant injuries.  No abnormalities on CT imaging, MRI ordered also normal but given patient's full resolution of her symptoms and ataxia at home there is concern for TIA.  Neurologist Dr. Selina Cooley agrees patient needs admission for further workup and to get her INR therapeutic.  Patient notes that her hemoglobin had been low recently show she was eating a lot of greens trying to get her hemoglobin up and she thinks this is why her INR is now subtherapeutic.  Shortness of breath-ongoing problem for the patient, she does have elevated BNP but does not have any peripheral edema, no chest pain, no pulmonary edema on her chest x-ray.  She is going to be following up with cardiology.  I did do D-dimer but this was negative.  I have reviewed the patients home medicines and have made adjustments as needed       Amount and/or Complexity of Data Reviewed Labs: ordered. Radiology: ordered.  Risk Decision regarding hospitalization.           Final Clinical Impression(s) / ED Diagnoses Final diagnoses:  None    Rx / DC Orders ED Discharge Orders     None         Josem Kaufmann 07/24/23 Wilfred Lacy, MD 07/25/23 1127

## 2023-07-24 NOTE — ED Notes (Signed)
 Unsuccessful attempt to nurse handoff

## 2023-07-24 NOTE — ED Notes (Signed)
 ED TO INPATIENT HANDOFF REPORT  ED Nurse Name and Phone #: [phone]  S Name/Age/Gender Desiree Bowers 78 y.o. female Room/Bed: APA07/APA07  Code Status   Code Status: Not on file  Home/SNF/Other Home Patient oriented to: self, place, time, and situation Is this baseline? Yes   Triage Complete: Triage complete  Chief Complaint TIA (transient ischemic attack) [G45.9]  Triage Note Pt states her Hgb is low, pt states at 9.  Pt states fell this morning due being unbalanced since yesterday afternoon.  Denies hx of vertigo. Denies any N/V.  SOB with past few weeks, worse with exertion.  Denies any CP.  Denies any new cough.  Denies hitting her head with fall.  Pt is on coumadin.    Allergies Allergies  Allergen Reactions   Mirapex [Pramipexole Dihydrochloride] Itching and Swelling   Penicillins Rash   Atorvastatin Other (See Comments)   Fosamax [Alendronate] Other (See Comments)   Gabapentin Other (See Comments)    Couldn't move legs during the night after taking.   Levothyroxine Itching   Symbicort [Budesonide-Formoterol Fumarate] Itching    Level of Care/Admitting Diagnosis ED Disposition     ED Disposition  Admit   Condition  --   Comment  Hospital Area: [facility] [[id number]]  Level of Care: Telemetry [5]  Covid Evaluation: Asymptomatic - no recent exposure (last 10 days) testing not required  Diagnosis: TIA (transient ischemic attack) [098119]  Admitting Physician: Onnie Boer [[id number]]  Attending Physician: Cresenciano Lick          B Medical/Surgery History Past Medical History:  Diagnosis Date   Cancer (HCC)    breast    Coronary artery disease due to lipid rich plaque 04/13/2016   Diabetes mellitus without complication (HCC)    Diverticulosis of intestine without bleeding 12/13/2016   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 Emmit Pomfret   CATARACT EXTRACTION, BILATERAL     EYE SURGERY Bilateral Fall 2013   Cat Sx - Dr. Emmit Pomfret   MASTECTOMY Right      A IV Location/Drains/Wounds Patient Lines/Drains/Airways Status     Active Line/Drains/Airways     None            Intake/Output Last 24 hours No intake or output data in the 24 hours ending 07/24/23 2009  Labs/Imaging Results for orders placed or performed during the hospital encounter of 07/24/23 (from the past 48 hours)  CBC with Differential     Status: Abnormal   Collection Time: 07/24/23 11:26 AM  Result Value Ref Range   WBC 5.4 4.0 - 10.5 K/uL   RBC 4.32 3.87 - 5.11 MIL/uL   Hemoglobin 10.4 (L) 12.0 - 15.0 g/dL   HCT 29.5 (L) 62.1 - 30.8 %   MCV 82.2 80.0 - 100.0 fL   MCH 24.1 (L) 26.0 - 34.0 pg   MCHC 29.3 (L) 30.0 - 36.0 g/dL   RDW 65.7 (H) 84.6 - 96.2 %   Platelets 403 (H) 150 - 400 K/uL   nRBC 0.0 0.0 - 0.2 %   Neutrophils Relative % 52 %   Neutro Abs 2.8  1.7 - 7.7 K/uL   Lymphocytes Relative 37 %   Lymphs Abs 2.0 0.7 - 4.0 K/uL   Monocytes Relative 8 %   Monocytes Absolute 0.4 0.1 - 1.0 K/uL   Eosinophils Relative 2 %   Eosinophils Absolute 0.1 0.0 - 0.5 K/uL   Basophils Relative 1 %   Basophils Absolute 0.1 0.0 - 0.1 K/uL   WBC Morphology MORPHOLOGY UNREMARKABLE    RBC Morphology See Note     Comment: ANISOCYTOSIS   Smear Review MORPHOLOGY UNREMARKABLE    Immature Granulocytes 0 %   Abs Immature Granulocytes 0.01 0.00 - 0.07 K/uL   Polychromasia PRESENT     Comment: Performed at [facility], [address], [place], [place] [address]  Basic metabolic panel     Status: Abnormal   Collection Time: 07/24/23 11:26 AM  Result Value Ref Range   Sodium 137 135 - 145 mmol/L   Potassium 3.8 3.5 - 5.1 mmol/L    Chloride 102 98 - 111 mmol/L   CO2 27 22 - 32 mmol/L   Glucose, Bld 155 (H) 70 - 99 mg/dL    Comment: Glucose reference range applies only to samples taken after fasting for at least 8 hours.   BUN 16 8 - 23 mg/dL   Creatinine, Ser 0.27 0.44 - 1.00 mg/dL   Calcium 9.0 8.9 - 25.3 mg/dL   GFR, Estimated >66 >44 mL/min    Comment: (NOTE) Calculated using the CKD-EPI Creatinine Equation (2021)    Anion gap 8 5 - 15    Comment: Performed at [facility], [address] [place], [place] [address]  Protime-INR     Status: Abnormal   Collection Time: 07/24/23 11:26 AM  Result Value Ref Range   Prothrombin Time 18.6 (H) 11.4 - 15.2 seconds   INR 1.5 (H) 0.8 - 1.2    Comment: (NOTE) INR goal varies based on device and disease states. Performed at [facility], [address], [place], [place] [address]   Troponin I (High Sensitivity)     Status: Abnormal   Collection Time: 07/24/23 12:22 PM  Result Value Ref Range   Troponin I (High Sensitivity) 18 (H) <18 ng/L    Comment: (NOTE) Elevated high sensitivity troponin I (hsTnI) values and significant  changes across serial measurements may suggest ACS but many other  chronic and acute conditions are known to elevate hsTnI results.  Refer to the "Links" section for chest pain algorithms and additional  guidance. Performed at [facility], [address], [place], [place] [address]   Brain natriuretic peptide     Status: Abnormal   Collection Time: 07/24/23 12:23 PM  Result Value Ref Range   B Natriuretic Peptide 770.0 (H) 0.0 - 100.0 pg/mL    Comment: Performed at [facility], [address] [place], [place] [address]  Resp panel by RT-PCR (RSV, Flu A&B, Covid) Anterior Nasal Swab     Status: None   Collection Time: 07/24/23 12:29 PM   Specimen: Anterior Nasal Swab  Result Value Ref Range   SARS Coronavirus 2 by RT PCR NEGATIVE NEGATIVE    Comment: (NOTE) SARS-CoV-2 target nucleic acids are NOT DETECTED.  The SARS-CoV-2 RNA is  generally detectable in upper respiratory specimens during the acute phase of infection. The lowest concentration of SARS-CoV-2 viral copies this assay can detect is 138 copies/mL. A negative result does not preclude SARS-Cov-2 infection and should not be used as the sole basis for treatment or other patient management decisions. A  negative result may occur with  improper specimen collection/handling, submission of specimen other than nasopharyngeal swab, presence of viral mutation(s) within the areas targeted by this assay, and inadequate number of viral copies(<138 copies/mL). A negative result must be combined with clinical observations, patient history, and epidemiological information. The expected result is Negative.  Fact Sheet for Patients:  BloggerCourse.com  Fact Sheet for Healthcare Providers:  SeriousBroker.it  This test is no t yet approved or cleared by the [place] FDA and  has been authorized for detection and/or diagnosis of SARS-CoV-2 by FDA under an Emergency Use Authorization (EUA). This EUA will remain  in effect (meaning this test can be used) for the duration of the COVID-19 declaration under Section 564(b)(1) of the Act, 21 U.S.C.section 360bbb-3(b)(1), unless the authorization is terminated  or revoked sooner.       Influenza A by PCR NEGATIVE NEGATIVE   Influenza B by PCR NEGATIVE NEGATIVE    Comment: (NOTE) The Xpert Xpress SARS-CoV-2/FLU/RSV plus assay is intended as an aid in the diagnosis of influenza from Nasopharyngeal swab specimens and should not be used as a sole basis for treatment. Nasal washings and aspirates are unacceptable for Xpert Xpress SARS-CoV-2/FLU/RSV testing.  Fact Sheet for Patients: BloggerCourse.com  Fact Sheet for Healthcare Providers: SeriousBroker.it  This test is not yet approved or cleared by the [place] FDA  and has been authorized for detection and/or diagnosis of SARS-CoV-2 by FDA under an Emergency Use Authorization (EUA). This EUA will remain in effect (meaning this test can be used) for the duration of the COVID-19 declaration under Section 564(b)(1) of the Act, 21 U.S.C. section 360bbb-3(b)(1), unless the authorization is terminated or revoked.     Resp Syncytial Virus by PCR NEGATIVE NEGATIVE    Comment: (NOTE) Fact Sheet for Patients: BloggerCourse.com  Fact Sheet for Healthcare Providers: SeriousBroker.it  This test is not yet approved or cleared by the [place] FDA and has been authorized for detection and/or diagnosis of SARS-CoV-2 by FDA under an Emergency Use Authorization (EUA). This EUA will remain in effect (meaning this test can be used) for the duration of the COVID-19 declaration under Section 564(b)(1) of the Act, 21 U.S.C. section 360bbb-3(b)(1), unless the authorization is terminated or revoked.  Performed at [facility], [address], [place], [place] [address]   D-dimer, quantitative     Status: None   Collection Time: 07/24/23  1:57 PM  Result Value Ref Range   D-Dimer, Quant <0.27 0.00 - 0.50 ug/mL-FEU    Comment: (NOTE) At the manufacturer cut-off value of 0.5 g/mL FEU, this assay has a negative predictive value of 95-100%.This assay is intended for use in conjunction with a clinical pretest probability (PTP) assessment model to exclude pulmonary embolism (PE) and deep venous thrombosis (DVT) in outpatients suspected of PE or DVT. Results should be correlated with clinical presentation. Performed at [facility], [address], [place], [place] [address]   Troponin I (High Sensitivity)     Status: Abnormal   Collection Time: 07/24/23  1:57 PM  Result Value Ref Range   Troponin I (High Sensitivity) 18 (H) <18 ng/L    Comment: (NOTE) Elevated high sensitivity troponin I (hsTnI) values and  significant  changes across serial measurements may suggest ACS but many other  chronic and acute conditions are known to elevate hsTnI results.  Refer to the "Links" section for chest pain algorithms and additional  guidance. Performed at [facility], [address], [place], [place] [address]  MR BRAIN WO CONTRAST Result Date: 07/24/2023 CLINICAL DATA:  Provided history: Neuro deficit, acute, stroke suspected. Fall this morning due to imbalance. EXAM: MRI HEAD WITHOUT CONTRAST TECHNIQUE: Multiplanar, multiecho pulse sequences of the brain and surrounding structures were obtained without intravenous contrast. COMPARISON:  Head CT 07/24/2023.  Brain MRI 08/29/2019. FINDINGS: Brain: No age-advanced or lobar predominant cerebral atrophy. Multifocal T2 FLAIR hyperintense signal abnormality within the cerebral white matter, nonspecific but compatible with mild-to-moderate chronic small vessel ischemic disease. There is no acute infarct. No evidence of an intracranial mass. No chronic intracranial blood products. No extra-axial fluid collection. No midline shift. Vascular: Maintained flow voids within the proximal large arterial vessels. Skull and upper cervical spine: No focal worrisome marrow lesion. Sinuses/Orbits: No mass or acute finding within the imaged orbits. Prior bilateral ocular lens replacement. No significant paranasal sinus disease. IMPRESSION: 1.  No evidence of an acute intracranial abnormality. 2. Mild-to-moderate chronic small vessel ischemic changes within the cerebral white matter, similar to the prior brain MRI of 08/29/2019. Electronically Signed   By: Jackey Loge D.O.   On: 07/24/2023 16:04   CT Head Wo Contrast Result Date: 07/24/2023 CLINICAL DATA:  Neck trauma.  Minor head trauma. EXAM: CT HEAD WITHOUT CONTRAST CT CERVICAL SPINE WITHOUT CONTRAST TECHNIQUE: Multidetector CT imaging of the head and cervical spine was performed following the standard protocol without intravenous  contrast. Multiplanar CT image reconstructions of the cervical spine were also generated. RADIATION DOSE REDUCTION: This exam was performed according to the departmental dose-optimization program which includes automated exposure control, adjustment of the mA and/or kV according to patient size and/or use of iterative reconstruction technique. COMPARISON:  Brain MRI 08/29/2019 FINDINGS: CT HEAD FINDINGS Brain: No evidence of acute infarction, hemorrhage, hydrocephalus, extra-axial collection or mass lesion/mass effect. Cerebral volume loss and white matter low-density that is mild for age. Vascular: No hyperdense vessel or unexpected calcification. Skull: Normal. Negative for fracture or focal lesion. Sinuses/Orbits: No evidence of injury CT CERVICAL SPINE FINDINGS Alignment: No traumatic malalignment Skull base and vertebrae: No acute fracture. No primary bone lesion or focal pathologic process. Generalized osteopenia. 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 No visible canal hematoma. Disc levels:  Negative Upper chest: Negative IMPRESSION: No evidence of acute intracranial or cervical spine injury. Electronically Signed   By: Tiburcio Pea M.D.   On: 07/24/2023 12:52   CT Cervical Spine Wo Contrast Result Date: 07/24/2023 CLINICAL DATA:  Neck trauma.  Minor head trauma. EXAM: CT HEAD WITHOUT CONTRAST CT CERVICAL SPINE WITHOUT CONTRAST TECHNIQUE: Multidetector CT imaging of the head and cervical spine was performed following the standard protocol without intravenous contrast. Multiplanar CT image reconstructions of the cervical spine were also generated. RADIATION DOSE REDUCTION: This exam was performed according to the departmental dose-optimization  program which includes automated exposure control, adjustment of the mA and/or kV according to patient size and/or use of iterative reconstruction technique. COMPARISON:  Brain MRI 08/29/2019 FINDINGS: CT HEAD FINDINGS Brain: No evidence of acute infarction, hemorrhage, hydrocephalus, extra-axial collection or mass lesion/mass effect. Cerebral volume loss and white matter low-density that is mild for age. Vascular: No hyperdense vessel or unexpected calcification. Skull: Normal. Negative for fracture or focal lesion. Sinuses/Orbits: No evidence of injury CT CERVICAL SPINE FINDINGS Alignment: No traumatic  malalignment Skull base and vertebrae: No acute fracture. No primary bone lesion or focal pathologic process. Generalized osteopenia. 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 No visible canal hematoma. Disc levels:  Negative Upper chest: Negative IMPRESSION: No evidence of acute intracranial or cervical spine injury. Electronically Signed   By: Tiburcio Pea M.D.   On: 07/24/2023 12:52   DG Chest Portable 1 View Result Date: 07/24/2023 CLINICAL DATA:  Shortness of breath EXAM: PORTABLE CHEST 1 VIEW COMPARISON:  Chest radiograph 07/07/2023 and chest CT from 07/10/2019 FINDINGS: Mild cardiac enlargement. Aortic atherosclerosis. No pleural fluid, interstitial edema or airspace disease. Increased sclerosis involving the right humeral head, right clavicle and right scapula. This appears unchanged from the prior exam. These findings are better assessed on prior CT from 07/10/2019. IMPRESSION: 1. No acute cardiopulmonary abnormalities. 2. Mild cardiac enlargement. 3. Unchanged increased sclerosis involving the right humeral head, right clavicle and right scapula.  Electronically Signed   By: Signa Kell M.D.   On: 07/24/2023 11:45    Pending Labs Unresulted Labs (From admission, onward)    None       Vitals/Pain Today's Vitals   07/24/23 1108 07/24/23 1200 07/24/23 1215 07/24/23 1245  BP: (!) 158/66 (!) 160/83 (!) 149/107 (!) 165/142  Pulse: 76 76 81 79  Resp: 20 (!) 23 19 (!) 30  Temp: (!) 97.5 F (36.4 C)     TempSrc: Oral     SpO2: 99% 95% 95% 95%  Weight:      Height:      PainSc:        Isolation Precautions No active isolations  Medications Medications  meclizine (ANTIVERT) tablet 25 mg (25 mg Oral Given 07/24/23 1649)    Mobility walks     Focused Assessments Neuro Assessment Handoff:  Swallow screen pass? Yes          Neuro Assessment:   Neuro Checks:      Has TPA been given? No If patient is a Neuro Trauma and patient is going to OR before floor call report to 4N Charge nurse: [phone] or [phone]   R Recommendations: See Admitting Provider Note  Report given to:   Additional Notes:

## 2023-07-24 NOTE — Assessment & Plan Note (Addendum)
 Presenting with episode of ataxia, with subsequent fall, now back to baseline.  History of atrial fibrillation on anticoagulation, but INR still therapeutic at 1.5.  Head CT, cervical CT, brain MRI without acute abnormality. - EDP talked to neurology, admit for stroke workup, also considering patient's subtherapeutic INR -PT, OT evaluation -Lipid panel, hemoglobin A1c -Echocardiogram -Carotid Dopplers -Resume warfarin, pharmacy to dose.

## 2023-07-24 NOTE — H&P (Signed)
 History and Physical    Desiree Bowers VOZ:[id number] DOB: June 01, 1946 DOA: 07/24/2023  PCP: Kirstie Peri, MD   Patient coming from: Home  I have personally briefly reviewed patient's old medical records in [facility]  Chief Complaint: Difficulty Walking  HPI: Desiree Bowers is a 78 y.o. female with medical history significant for diabetes mellitus, hypertension, paroxysmal atrial fibrillation.  Patient presented to the ED with complaints of difficulty walking.  She reports she woke up this morning at about 7:30 AM, and was leaning to the right while ambulating, and she could not walk a straight line.  She reports she fell onto her left, she did not hit her head..  She is unable to tell me how long this lasted. She ambulated here in the ED , and this has improved. No change in vision.  No change in speech.  No reports of facial asymmetry. She also reports about 6 weeks ago, she bent over and suddenly became short of breath.  Reports since then has breathing has not been back to normal.  Has a chronic unchanged cough.  No chest pain.  No lower extremity swelling.  ED Course: Blood pressure elevated 149-200 systolic.  Temperature 97.5.  Heart rate 76-93.  Respiratory rate 19-30. O2 sat greater than 95% on room air.  Unremarkable CBC BMP.   INR 1.5. Troponin 18 X 2. BNP 770. Chest x-ray negative for acute abnormality. Head and cervical CT without acute abnormality. MRI brain without acute intracranial abnormality, mild to moderate chronic small vessel ischemic changes within the cerebral white matter are similar to prior MRI. EDP talked to neurologist Dr. Selina Cooley, recommended admission for TIA due to ataxia that has resolved, especially as patient's INR is subtherapeutic, teleneurology consult also.  Review of Systems: As per HPI all other systems reviewed and negative.  Past Medical History:  Diagnosis Date   Cancer [facility])    breast    Coronary artery disease due to lipid rich plaque  04/13/2016   Diabetes mellitus without complication (HCC)    Diverticulosis of intestine without bleeding 12/13/2016   History of right mastectomy 11/15/2014   Hypothyroidism    Overactive bladder    Paget's disease of the bone    Paroxysmal atrial fibrillation (HCC) 06/19/2014   Restless leg syndrome     Past Surgical History:  Procedure Laterality Date   ABDOMINAL HYSTERECTOMY  1999   APPENDECTOMY     as a child   CATARACT EXTRACTION Bilateral Fall 2013   Dr. Emmit Pomfret   CATARACT EXTRACTION, BILATERAL     EYE SURGERY Bilateral Fall 2013   Cat Sx - Dr. Emmit Pomfret   MASTECTOMY Right      reports that she has quit smoking. She has never used smokeless tobacco. She reports that she does not drink alcohol and does not use drugs.  Allergies  Allergen Reactions   Mirapex [Pramipexole Dihydrochloride] Itching and Swelling   Penicillins Rash   Atorvastatin Other (See Comments)   Fosamax [Alendronate] Other (See Comments)   Gabapentin Other (See Comments)    Couldn't move legs during the night after taking.   Levothyroxine Itching   Symbicort [Budesonide-Formoterol Fumarate] Itching    Family History  Problem Relation Age of Onset   Breast cancer Mother    Colon cancer Father    Kidney cancer Father    Bladder Cancer Sister    Suicidality Brother    Heart attack Maternal Grandmother    Breast cancer Sister    Stomach  cancer Sister    Ovarian cancer Sister    Breast cancer Sister    Breast cancer Sister    Diabetes Sister    Other Brother        parathyroid disorder; Engelmann's disease   Angelman syndrome Son    Luiz Blare' disease Daughter     Prior to Admission medications   Medication Sig Start Date End Date Taking? Authorizing Provider  calcium carbonate (OSCAL) 1500 (600 Ca) MG TABS tablet Take 2 tablets by mouth daily.     [provider]  furosemide (LASIX) 20 MG tablet Take 20 mg by mouth daily as needed for edema.  02/16/17   [provider]  loratadine  (CLARITIN) 10 MG tablet Take 10 mg by mouth daily.    [provider]  magnesium oxide (MAG-OX) 400 (241.3 Mg) MG tablet Take 400 mg by mouth daily.     [provider]  meloxicam (MOBIC) 7.5 MG tablet Take 7.5 mg by mouth as needed for pain.    [provider]  metoprolol tartrate (LOPRESSOR) 50 MG tablet TAKE 1 TABLET BY MOUTH TWICE A DAY 05/23/23   Antoine Poche, MD  oxybutynin (DITROPAN-XL) 10 MG 24 hr tablet TAKE 1 TABLET EVERY DAY 01/28/20   Gwenlyn Fudge, FNP  pantoprazole (PROTONIX) 40 MG tablet Take 40 mg by mouth daily. 03/02/21   [provider]  SYNTHROID 112 MCG tablet Take 112 mcg by mouth daily before breakfast.  04/21/17   [provider]  warfarin (COUMADIN) 5 MG tablet TAKE 1/2 TO 1 TABLET DAILY AS DIRECTED BY COUMADIN CLINIC *[facility] ONLY* 02/08/23   Antoine Poche, MD    Physical Exam: Vitals:   07/24/23 1108 07/24/23 1200 07/24/23 1215 07/24/23 1245  BP: (!) 158/66 (!) 160/83 (!) 149/107 (!) 165/142  Pulse: 76 76 81 79  Resp: 20 (!) 23 19 (!) 30  Temp: (!) 97.5 F (36.4 C)     TempSrc: Oral     SpO2: 99% 95% 95% 95%  Weight:      Height:        Constitutional: NAD, calm, comfortable Vitals:   07/24/23 1108 07/24/23 1200 07/24/23 1215 07/24/23 1245  BP: (!) 158/66 (!) 160/83 (!) 149/107 (!) 165/142  Pulse: 76 76 81 79  Resp: 20 (!) 23 19 (!) 30  Temp: (!) 97.5 F (36.4 C)     TempSrc: Oral     SpO2: 99% 95% 95% 95%  Weight:      Height:       Eyes: PERRL, lids and conjunctivae normal ENMT: Mucous membranes are moist. Neck: normal, supple, no masses, no thyromegaly Respiratory: clear to auscultation bilaterally, no wheezing, no crackles. Normal respiratory effort. No accessory muscle use.  Cardiovascular: Regular rate and rhythm, no murmurs / rubs / gallops. No extremity edema.   Extremities warm. Abdomen: no tenderness, no masses palpated. No hepatosplenomegaly. Bowel sounds positive.  Musculoskeletal:  no clubbing / cyanosis. No joint deformity upper and lower extremities.  Skin: no rashes, lesions, ulcers. No induration Neurologic:  Neurological:     Mental Status: She is alert.     GCS: GCS eye subscore is 4. GCS verbal subscore is 5. GCS motor subscore is 6.     Comments: Mental Status:  Alert, oriented, thought content appropriate, able to give a coherent history. Speech fluent without evidence of aphasia. Able to follow 2 step commands without difficulty.  Cranial Nerves:  II:  Peripheral visual fields grossly normal, pupils  equal, round, reactive to light III,IV, VI: ptosis not present, extra-ocular motions intact bilaterally  V,VII: smile symmetric, eyebrows raise symmetric, facial light touch sensation equal VIII: hearing grossly normal to voice  Motor:  Normal tone. Equal and full 5/5 strength in bilat upper and lower extremities bilaterally Sensory: Sensation intact to light touch in all extremities.  Psychiatric: Normal judgment and insight. Alert and oriented x 3. Normal mood.   Labs on Admission: I have personally reviewed following labs and imaging studies  CBC: Recent Labs  Lab 07/24/23 1126  WBC 5.4  NEUTROABS 2.8  HGB 10.4*  HCT 35.5*  MCV 82.2  PLT 403*   Basic Metabolic Panel: Recent Labs  Lab 07/24/23 1126  NA 137  K 3.8  CL 102  CO2 27  GLUCOSE 155*  BUN 16  CREATININE 0.71  CALCIUM 9.0   Coagulation Profile: Recent Labs  Lab 07/24/23 1126  INR 1.5*   Radiological Exams on Admission: MR BRAIN WO CONTRAST Result Date: 07/24/2023 CLINICAL DATA:  Provided history: Neuro deficit, acute, stroke suspected. Fall this morning due to imbalance. EXAM: MRI HEAD WITHOUT CONTRAST TECHNIQUE: Multiplanar, multiecho pulse sequences of the brain and surrounding structures were obtained without intravenous contrast. COMPARISON:  Head CT 07/24/2023.  Brain MRI 08/29/2019. FINDINGS: Brain: No age-advanced or lobar predominant cerebral atrophy. Multifocal T2  FLAIR hyperintense signal abnormality within the cerebral white matter, nonspecific but compatible with mild-to-moderate chronic small vessel ischemic disease. There is no acute infarct. No evidence of an intracranial mass. No chronic intracranial blood products. No extra-axial fluid collection. No midline shift. Vascular: Maintained flow voids within the proximal large arterial vessels. Skull and upper cervical spine: No focal worrisome marrow lesion. Sinuses/Orbits: No mass or acute finding within the imaged orbits. Prior bilateral ocular lens replacement. No significant paranasal sinus disease. IMPRESSION: 1.  No evidence of an acute intracranial abnormality. 2. Mild-to-moderate chronic small vessel ischemic changes within the cerebral white matter, similar to the prior brain MRI of 08/29/2019. Electronically Signed   By: Jackey Loge D.O.   On: 07/24/2023 16:04   CT Head Wo Contrast Result Date: 07/24/2023 CLINICAL DATA:  Neck trauma.  Minor head trauma. EXAM: CT HEAD WITHOUT CONTRAST CT CERVICAL SPINE WITHOUT CONTRAST TECHNIQUE: Multidetector CT imaging of the head and cervical spine was performed following the standard protocol without intravenous contrast. Multiplanar CT image reconstructions of the cervical spine were also generated. RADIATION DOSE REDUCTION: This exam was performed according to the departmental dose-optimization program which includes automated exposure control, adjustment of the mA and/or kV according to patient size and/or use of iterative reconstruction technique. COMPARISON:  Brain MRI 08/29/2019 FINDINGS: CT HEAD FINDINGS Brain: No evidence of acute infarction, hemorrhage, hydrocephalus, extra-axial collection or mass lesion/mass effect. Cerebral volume loss and white matter low-density that is mild for age. Vascular: No hyperdense vessel or unexpected calcification. Skull: Normal. Negative for fracture or focal lesion. Sinuses/Orbits: No evidence of injury CT CERVICAL SPINE  FINDINGS Alignment: No traumatic malalignment Skull base and vertebrae: No acute fracture. No primary bone lesion or focal pathologic process. Generalized osteopenia. 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 No visible canal hematoma. Disc levels:  Negative Upper chest: Negative IMPRESSION: No evidence of acute intracranial or cervical spine injury. Electronically Signed  By: Tiburcio Pea M.D.   On: 07/24/2023 12:52   CT Cervical Spine Wo Contrast Result Date: 07/24/2023 CLINICAL DATA:  Neck trauma.  Minor head trauma. EXAM: CT HEAD WITHOUT CONTRAST CT CERVICAL SPINE WITHOUT CONTRAST TECHNIQUE: Multidetector CT imaging of the head and cervical spine was performed following the standard protocol without intravenous contrast. Multiplanar CT image reconstructions of the cervical spine were also generated. RADIATION DOSE REDUCTION: This exam was performed according to the departmental dose-optimization program which includes automated exposure control, adjustment of the mA and/or kV according to patient size and/or use of iterative reconstruction technique. COMPARISON:  Brain MRI 08/29/2019 FINDINGS: CT HEAD FINDINGS Brain: No evidence of acute infarction, hemorrhage, hydrocephalus, extra-axial collection or mass lesion/mass effect. Cerebral volume loss and white matter low-density that is mild for age. Vascular: No hyperdense vessel or unexpected calcification. Skull: Normal. Negative for fracture or focal lesion. Sinuses/Orbits: No evidence of injury CT CERVICAL SPINE FINDINGS Alignment: No traumatic malalignment Skull base and vertebrae: No acute fracture. No primary bone lesion or focal pathologic process. Generalized osteopenia. Coarsened trabecula a  and cortical irregularity involving the right clavicle and covered right first and second ribs, there is history of both Paget's disease and breast cancer treatment-stable appearance since CT of the chest in 2021 Soft tissues and spinal canal: No prevertebral fluid or swelling. No visible canal hematoma. Disc levels:  Negative Upper chest: Negative IMPRESSION: No evidence of acute intracranial or cervical spine injury. Electronically Signed   By: Tiburcio Pea M.D.   On: 07/24/2023 12:52   DG Chest Portable 1 View Result Date: 07/24/2023 CLINICAL DATA:  Shortness of breath EXAM: PORTABLE CHEST 1 VIEW COMPARISON:  Chest radiograph 07/07/2023 and chest CT from 07/10/2019 FINDINGS: Mild cardiac enlargement. Aortic atherosclerosis. No pleural fluid, interstitial edema or airspace disease. Increased sclerosis involving the right humeral head, right clavicle and right scapula. This appears unchanged from the prior exam. These findings are better assessed on prior CT from 07/10/2019. IMPRESSION: 1. No acute cardiopulmonary abnormalities. 2. Mild cardiac enlargement. 3. Unchanged increased sclerosis involving the right humeral head, right clavicle and right scapula. Electronically Signed   By: Signa Kell M.D.   On: 07/24/2023 11:45    EKG: Independently reviewed.  Sinus rhythm, rate 75, QTc 473.  No significant change from prior.  Assessment/Plan Principal Problem:   TIA (transient ischemic attack) Active Problems:   Chronic cough   Essential (primary) hypertension   DM type 2 with diabetic dyslipidemia (HCC)   GAD (generalized anxiety disorder)   Acquired hypothyroidism   PAF (paroxysmal atrial fibrillation) (HCC)  Assessment and Plan: * TIA (transient ischemic attack) Presenting with episode of ataxia, with subsequent fall, now back to baseline.  History of atrial fibrillation on anticoagulation, but INR still therapeutic at 1.5.  Head CT, cervical CT, brain MRI without acute abnormality. - EDP  talked to neurology, admit for stroke workup, also considering patient's subtherapeutic INR -PT, OT evaluation -Lipid panel, hemoglobin A1c -Echocardiogram -Carotid Dopplers -Resume warfarin, pharmacy to dose.  Chronic cough Chronic unchanged cough.  Also reports episode of dyspnea about 6 weeks ago when she bends over, since then has not been back to baseline.  No signs of volume overload.  Weights have per chart stable to improved over the past. Chest x-ray clear.  BNP elevated at 770- no prior to compare.  On Coumadin and has had therapeutic INR levels up until today. - F/u Echo  PAF (paroxysmal atrial fibrillation) (HCC) Rate controlled.  Currently in  sinus rhythm.  INR subtherapeutic at 1.5.  She is not sure if she ate more vegetables than she should have this past week.  She is on Coumadin due to cost of DOAC's. -Pharmacy consult for subtherapeutic INR -Resume metoprolol  Acquired hypothyroidism Resume Synthroid - Check TSH  DM type 2 with diabetic dyslipidemia (HCC) - HgbA1c -Not on Medications -SSI- s  Essential (primary) hypertension Elevated. -Resume metoprolol 50 daily -As needed labetalol 10mg  for systolic greater than 170    DVT prophylaxis: Lovenox Code Status: FULL code Family Communication: None at bedside Disposition Plan: ~ 1- 2 days Consults called: neurology Admission status: Obs tele     Author: Onnie Boer, MD 07/24/2023 9:56 PM  For on call review [email or web address].

## 2023-07-24 NOTE — Assessment & Plan Note (Signed)
 Resume Synthroid - Check TSH

## 2023-07-24 NOTE — ED Triage Notes (Signed)
 Pt states her Hgb is low, pt states at 9.  Pt states fell this morning due being unbalanced since yesterday afternoon.  Denies hx of vertigo. Denies any N/V.  SOB with past few weeks, worse with exertion.  Denies any CP.  Denies any new cough.  Denies hitting her head with fall.  Pt is on coumadin.

## 2023-07-25 ENCOUNTER — Observation Stay (HOSPITAL_COMMUNITY): Payer: Medicare Other

## 2023-07-25 ENCOUNTER — Other Ambulatory Visit (HOSPITAL_COMMUNITY): Payer: Self-pay

## 2023-07-25 ENCOUNTER — Telehealth (HOSPITAL_COMMUNITY): Payer: Self-pay | Admitting: Pharmacy Technician

## 2023-07-25 DIAGNOSIS — G459 Transient cerebral ischemic attack, unspecified: Secondary | ICD-10-CM | POA: Diagnosis not present

## 2023-07-25 LAB — ECHOCARDIOGRAM COMPLETE
AR max vel: 1.92 cm2
AV Area VTI: 1.77 cm2
AV Area mean vel: 1.81 cm2
AV Mean grad: 5.2 mm[Hg]
AV Peak grad: 8.8 mm[Hg]
Ao pk vel: 1.49 m/s
Area-P 1/2: 5.97 cm2
Height: 65 in
MV M vel: 5.28 m/s
MV Peak grad: 111.5 mm[Hg]
P 1/2 time: 640 ms
Radius: 0.4 cm
S' Lateral: 2.4 cm
Weight: 2899.49 [oz_av]

## 2023-07-25 LAB — CBC
HCT: 33.4 % — ABNORMAL LOW (ref 36.0–46.0)
Hemoglobin: 9.9 g/dL — ABNORMAL LOW (ref 12.0–15.0)
MCH: 24.3 pg — ABNORMAL LOW (ref 26.0–34.0)
MCHC: 29.6 g/dL — ABNORMAL LOW (ref 30.0–36.0)
MCV: 81.9 fL (ref 80.0–100.0)
Platelets: 392 10*3/uL (ref 150–400)
RBC: 4.08 MIL/uL (ref 3.87–5.11)
RDW: 21.6 % — ABNORMAL HIGH (ref 11.5–15.5)
WBC: 5.9 10*3/uL (ref 4.0–10.5)
nRBC: 0 % (ref 0.0–0.2)

## 2023-07-25 LAB — GLUCOSE, CAPILLARY
Glucose-Capillary: 99 mg/dL (ref 70–99)
Glucose-Capillary: 109 mg/dL — ABNORMAL HIGH (ref 70–99)
Glucose-Capillary: 111 mg/dL — ABNORMAL HIGH (ref 70–99)
Glucose-Capillary: 109 mg/dL — ABNORMAL HIGH (ref 70–99)

## 2023-07-25 LAB — LIPID PANEL
Cholesterol: 136 mg/dL (ref 0–200)
HDL: 31 mg/dL — ABNORMAL LOW (ref >40)
LDL Cholesterol: 84 mg/dL (ref 0–99)
Total CHOL/HDL Ratio: 4.4 {ratio}
Triglycerides: 106 mg/dL (ref <150)
VLDL: 21 mg/dL (ref 0–40)

## 2023-07-25 LAB — HEMOGLOBIN A1C
Hgb A1c MFr Bld: 5.8 % — ABNORMAL HIGH (ref 4.8–5.6)
Mean Plasma Glucose: 119.76 mg/dL

## 2023-07-25 LAB — TSH: TSH: 2.899 u[IU]/mL (ref 0.350–4.500)

## 2023-07-25 LAB — PROTIME-INR
INR: 1.7 — ABNORMAL HIGH (ref 0.8–1.2)
Prothrombin Time: 20.3 s — ABNORMAL HIGH (ref 11.4–15.2)

## 2023-07-25 MED ORDER — HYDROXYZINE HCL 25 MG PO TABS
25.0000 mg | ORAL_TABLET | Freq: Three times a day (TID) | ORAL | Status: DC | PRN
  Administered 2023-07-25 – 2023-07-28 (×5): 25 mg via ORAL
  Filled 2023-07-25 (×5): qty 1

## 2023-07-25 MED ORDER — WARFARIN SODIUM 2 MG PO TABS
4.0000 mg | ORAL_TABLET | Freq: Once | ORAL | Status: AC
  Administered 2023-07-25: 4 mg via ORAL
  Filled 2023-07-25: qty 2

## 2023-07-25 NOTE — Progress Notes (Addendum)
 Nurse at bedside,patient c/o anxiety,Dr Jarvis Newcomer notified. Patient given Hydroxyzine Hydrochloride 25 mg's given by mouth per MAR prn. Plan of care on going.

## 2023-07-25 NOTE — Evaluation (Signed)
 Physical Therapy Evaluation Patient Details Name: LIVIA TARR MRN: [id number] DOB: 06-24-1945 Today's Date: 07/25/2023  History of Present Illness  Desiree Bowers is a 78 y.o. female with medical history significant for diabetes mellitus, hypertension, paroxysmal atrial fibrillation.   Patient presented to the ED with complaints of difficulty walking.  She reports she woke up this morning at about 7:30 AM, and was leaning to the right while ambulating, and she could not walk a straight line.  She reports she fell onto her left, she did not hit her head..  She is unable to tell me how long this lasted. She ambulated here in the ED , and this has improved.  No change in vision.  No change in speech.  No reports of facial asymmetry.  She also reports about 6 weeks ago, she bent over and suddenly became short of breath.  Reports since then has breathing has not been back to normal.  Has a chronic unchanged cough.  No chest pain.  No lower extremity swelling.   Clinical Impression  Patient was agreeable to therapy. Patient performed all tasks Mod I. At start of ambulation patient was using wall railing for balance. Gilmer Mor was provided to patient to limit wall use of balance. Patient had good return on cane use for ambulation. Plan:  Patient discharged from physical therapy to care of nursing for ambulation daily as tolerated for length of stay.         If plan is discharge home, recommend the following: A little help with walking and/or transfers;A little help with bathing/dressing/bathroom;Assistance with cooking/housework;Help with stairs or ramp for entrance   Can travel by private vehicle        Equipment Recommendations None recommended by PT  Recommendations for Other Services       Functional Status Assessment Patient has had a recent decline in their functional status and demonstrates the ability to make significant improvements in function in a reasonable and predictable amount of time.      Precautions / Restrictions Precautions Precautions: None Restrictions Weight Bearing Restrictions Per Provider Order: No      Mobility  Bed Mobility Overal bed mobility: Modified Independent               Patient Response: Cooperative  Transfers Overall transfer level: Modified independent Equipment used: None                    Ambulation/Gait Ambulation/Gait assistance: Modified independent (Device/Increase time) Gait Distance (Feet): 100 Feet Assistive device: Straight cane Gait Pattern/deviations: Step-to pattern, Decreased step length - right, Decreased step length - left Gait velocity: decreased     General Gait Details: leaned on rail at start of ambulation, cane was provided and patient had good return on cane use  Stairs            Wheelchair Mobility     Tilt Bed Tilt Bed Patient Response: Cooperative  Modified Rankin (Stroke Patients Only)       Balance Overall balance assessment: Modified Independent                                           Pertinent Vitals/Pain Pain Assessment Pain Assessment: No/denies pain    Home Living Family/patient expects to be discharged to:: Private residence Living Arrangements: Alone Available Help at Discharge: Family;Available PRN/intermittently Type of Home: House Home Access:  Stairs to enter Entrance Stairs-Rails: None Entrance Stairs-Number of Steps: 3   Home Layout: One level;Other (Comment) Home Equipment: Shower seat      Prior Function Prior Level of Function : Independent/Modified Independent;Driving             Mobility Comments: No AD ADLs Comments: Indep     Extremity/Trunk Assessment   Upper Extremity Assessment Upper Extremity Assessment: Defer to OT evaluation    Lower Extremity Assessment Lower Extremity Assessment: Overall WFL for tasks assessed    Cervical / Trunk Assessment Cervical / Trunk Assessment: Normal  Communication    Communication Communication: No apparent difficulties    Cognition Arousal: Alert Behavior During Therapy: WFL for tasks assessed/performed   PT - Cognitive impairments: No apparent impairments                         Following commands: Intact       Cueing Cueing Techniques: Verbal cues     General Comments      Exercises     Assessment/Plan    PT Assessment Patient does not need any further PT services  PT Problem List         PT Treatment Interventions      PT Goals (Current goals can be found in the Care Plan section)  Acute Rehab PT Goals Patient Stated Goal: to return home PT Goal Formulation: With patient Time For Goal Achievement: 08/01/23 Potential to Achieve Goals: Good    Frequency       Co-evaluation               AM-PAC PT "6 Clicks" Mobility  Outcome Measure Help needed turning from your back to your side while in a flat bed without using bedrails?: None Help needed moving from lying on your back to sitting on the side of a flat bed without using bedrails?: None Help needed moving to and from a bed to a chair (including a wheelchair)?: None Help needed standing up from a chair using your arms (e.g., wheelchair or bedside chair)?: None Help needed to walk in hospital room?: A Little Help needed climbing 3-5 steps with a railing? : A Lot 6 Click Score: 21    End of Session   Activity Tolerance: Patient tolerated treatment well Patient left: in bed;with call bell/phone within reach;with family/visitor present Nurse Communication: Mobility status PT Visit Diagnosis: Unsteadiness on feet (R26.81);Other abnormalities of gait and mobility (R26.89);Muscle weakness (generalized) (M62.81)    Time: 1478-2956 PT Time Calculation (min) (ACUTE ONLY): 16 min   Charges:   PT Evaluation $PT Eval Low Complexity: 1 Low PT Treatments $Therapeutic Activity: 8-22 mins PT General Charges $$ ACUTE PT VISIT: 1 Visit         Rondo Spittler SPT

## 2023-07-25 NOTE — Telephone Encounter (Signed)
 Patient Product/process development scientist completed.    The patient is insured through [employer]. Patient has Medicare and is not eligible for a copay card, but may be able to apply for patient assistance or Medicare RX Payment Plan (Patient Must reach out to their plan, if eligible for payment plan), if available.    Ran test claim for Eliquis 5 mg and the current 30 day co-pay is $556.87 due to a $590.00 deductible.   This test claim was processed through [facility]- copay amounts may vary at other pharmacies due to pharmacy/plan contracts, or as the patient moves through the different stages of their insurance plan.     Roland Earl, CPHT Pharmacy Technician III Certified Patient Advocate [facility] Patient Advocate Team Direct Number: [phone]  Fax: [phone]

## 2023-07-25 NOTE — Discharge Summary (Incomplete)
 Physician Discharge Summary   Patient: Desiree Bowers MRN: [id number] DOB: 11/21/1945  Admit date:     07/24/2023  Discharge date: 07/25/23  Discharge Physician: Tyrone Nine   PCP: Kirstie Peri, MD   Recommendations at discharge:  Follow up with PCP in 1-2 weeks. Suggest continued dietary modifications to lower LDL in setting of statin intolerance. If LDL remains above goal of 70, consider alternative statin.  Needs closer follow up with cardiology for ongoing coumadin management. Note TIA symptoms in setting of subtherapeutic INR. Confirmed DOAC copay would be $556.87 due to a $590.00 deductible, so pt opts to continue coumadin.  Follow up with neurology in 6-8 weeks after discharge.   Discharge Diagnoses: Principal Problem:   TIA (transient ischemic attack) Active Problems:   Chronic cough   Essential (primary) hypertension   DM type 2 with diabetic dyslipidemia (HCC)   GAD (generalized anxiety disorder)   Acquired hypothyroidism   PAF (paroxysmal atrial fibrillation) [facility])  Hospital Course: HPI: Desiree Bowers is a 78 y.o. female with medical history significant for diabetes mellitus, hypertension, paroxysmal atrial fibrillation.  Patient presented to the ED with complaints of difficulty walking.  She reports she woke up this morning at about 7:30 AM, and was leaning to the right while ambulating, and she could not walk a straight line.  She reports she fell onto her left, she did not hit her head..  She is unable to tell me how long this lasted. She ambulated here in the ED , and this has improved. No change in vision.  No change in speech.  No reports of facial asymmetry. She also reports about 6 weeks ago, she bent over and suddenly became short of breath.  Reports since then has breathing has not been back to normal.  Has a chronic unchanged cough.  No chest pain.  No lower extremity swelling.   ED Course: Blood pressure elevated 149-200 systolic.  Temperature 97.5.  Heart rate  76-93.  Respiratory rate 19-30. O2 sat greater than 95% on room air.  Unremarkable CBC BMP.   INR 1.5. Troponin 18 X 2. BNP 770. Chest x-ray negative for acute abnormality. Head and cervical CT without acute abnormality. MRI brain without acute intracranial abnormality, mild to moderate chronic small vessel ischemic changes within the cerebral white matter are similar to prior MRI. EDP talked to neurologist Dr. Selina Cooley, recommended admission for TIA due to ataxia that has resolved, especially as patient's INR is subtherapeutic, teleneurology consult also.  Assessment and Plan: TIA: Transient ataxia with subsequent fall, now returned to baseline. Neuroimaging without acute changes. MRI did reveal mild-to-moderate chronic small vessel ischemic changes within the cerebral white matter, similar to the prior brain MRI of 08/29/2019. - MRA*** - Carotid U/S *** - Echocardiogram *** - Lipid panel shows dyslipidemia with LDL slightly above goal of 70 (at 84) and low HDL (31). Note unspecified atorvastatin intolerance. Dietary counseling was provided, suggest recheck LDL at follow up and if still elevated, consider start lowest dose rosuvastatin - Resume warfarin, pharmacy to dose.  PAF: Currently in NSR.  - Continue metoprolol - Continue coumadin, suggest cardiology follow up and further consideration of DOAC. INR at discharge is 1.7 with goal of 2-3. Instead of her typical 2.5mg  dose tonight, recommend 4mg  dose (provided prior to discharge).   T2DM: HbA1c at goal at 5.8%. This is diet-controlled.   HTN:  - Long term goal normotension, continue home metoprolol 50 daily  Hypothyroidism: TSH wnl at 2.899.  -  Continue synthroid.   Chronic cough: Chronic unchanged cough.  Also reports episode of dyspnea about 6 weeks ago when she bends over, since then has not been back to baseline.  No signs of volume overload.  Weights have per chart stable to improved over the past. Chest x-ray clear.  BNP elevated  at 770- no prior to compare.  - PCP follow up  Consultants: Neurology Procedures performed: Echocardiogram  Disposition: Home Diet recommendation:  Cardiac diet DISCHARGE MEDICATION: Allergies as of 07/25/2023       Reactions   Mirapex [pramipexole Dihydrochloride] Itching, Swelling   Penicillins Rash   Atorvastatin Other (See Comments)   Fosamax [alendronate] Other (See Comments)   Gabapentin Other (See Comments)   Couldn't move legs during the night after taking.   Levothyroxine Itching   Symbicort [budesonide-formoterol Fumarate] Itching     Med Rec must be completed prior to using this [facility]***       Discharge Exam: Filed Weights   07/24/23 1107 07/24/23 2143  Weight: 83 kg 82.2 kg  Gen: No distress Pulm: Clear, nonlabored  CV: RRR, no JVD supine GI: Soft, NT, ND, +BS  Neuro: Alert and oriented. No new focal deficits. Ext: Warm, no deformities. Skin: No rashes, lesions or ulcers on visualized skin   Condition at discharge: stable  The results of significant diagnostics from this hospitalization (including imaging, microbiology, ancillary and laboratory) are listed below for reference.   Imaging Studies: MR BRAIN WO CONTRAST Result Date: 07/24/2023 CLINICAL DATA:  Provided history: Neuro deficit, acute, stroke suspected. Fall this morning due to imbalance. EXAM: MRI HEAD WITHOUT CONTRAST TECHNIQUE: Multiplanar, multiecho pulse sequences of the brain and surrounding structures were obtained without intravenous contrast. COMPARISON:  Head CT 07/24/2023.  Brain MRI 08/29/2019. FINDINGS: Brain: No age-advanced or lobar predominant cerebral atrophy. Multifocal T2 FLAIR hyperintense signal abnormality within the cerebral white matter, nonspecific but compatible with mild-to-moderate chronic small vessel ischemic disease. There is no acute infarct. No evidence of an intracranial mass. No chronic intracranial blood products. No extra-axial fluid collection. No midline  shift. Vascular: Maintained flow voids within the proximal large arterial vessels. Skull and upper cervical spine: No focal worrisome marrow lesion. Sinuses/Orbits: No mass or acute finding within the imaged orbits. Prior bilateral ocular lens replacement. No significant paranasal sinus disease. IMPRESSION: 1.  No evidence of an acute intracranial abnormality. 2. Mild-to-moderate chronic small vessel ischemic changes within the cerebral white matter, similar to the prior brain MRI of 08/29/2019. Electronically Signed   By: Jackey Loge D.O.   On: 07/24/2023 16:04   CT Head Wo Contrast Result Date: 07/24/2023 CLINICAL DATA:  Neck trauma.  Minor head trauma. EXAM: CT HEAD WITHOUT CONTRAST CT CERVICAL SPINE WITHOUT CONTRAST TECHNIQUE: Multidetector CT imaging of the head and cervical spine was performed following the standard protocol without intravenous contrast. Multiplanar CT image reconstructions of the cervical spine were also generated. RADIATION DOSE REDUCTION: This exam was performed according to the departmental dose-optimization program which includes automated exposure control, adjustment of the mA and/or kV according to patient size and/or use of iterative reconstruction technique. COMPARISON:  Brain MRI 08/29/2019 FINDINGS: CT HEAD FINDINGS Brain: No evidence of acute infarction, hemorrhage, hydrocephalus, extra-axial collection or mass lesion/mass effect. Cerebral volume loss and white matter low-density that is mild for age. Vascular: No hyperdense vessel or unexpected calcification. Skull: Normal. Negative for fracture or focal lesion. Sinuses/Orbits: No evidence of injury CT CERVICAL SPINE FINDINGS Alignment: No traumatic malalignment Skull base and vertebrae: No  acute fracture. No primary bone lesion or focal pathologic process. Generalized osteopenia. Coarsened trabecula a and cortical irregularity involving the right clavicle and covered right first and second ribs, there is history of both  Paget's disease and breast cancer treatment-stable appearance since CT of the chest in 2021 Soft tissues and spinal canal: No prevertebral fluid or swelling. No visible canal hematoma. Disc levels:  Negative Upper chest: Negative IMPRESSION: No evidence of acute intracranial or cervical spine injury. Electronically Signed   By: Tiburcio Pea M.D.   On: 07/24/2023 12:52   CT Cervical Spine Wo Contrast Result Date: 07/24/2023 CLINICAL DATA:  Neck trauma.  Minor head trauma. EXAM: CT HEAD WITHOUT CONTRAST CT CERVICAL SPINE WITHOUT CONTRAST TECHNIQUE: Multidetector CT imaging of the head and cervical spine was performed following the standard protocol without intravenous contrast. Multiplanar CT image reconstructions of the cervical spine were also generated. RADIATION DOSE REDUCTION: This exam was performed according to the departmental dose-optimization program which includes automated exposure control, adjustment of the mA and/or kV according to patient size and/or use of iterative reconstruction technique. COMPARISON:  Brain MRI 08/29/2019 FINDINGS: CT HEAD FINDINGS Brain: No evidence of acute infarction, hemorrhage, hydrocephalus, extra-axial collection or mass lesion/mass effect. Cerebral volume loss and white matter low-density that is mild for age. Vascular: No hyperdense vessel or unexpected calcification. Skull: Normal. Negative for fracture or focal lesion. Sinuses/Orbits: No evidence of injury CT CERVICAL SPINE FINDINGS Alignment: No traumatic malalignment Skull base and vertebrae: No acute fracture. No primary bone lesion or focal pathologic process. Generalized osteopenia. 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 No visible canal hematoma. Disc  levels:  Negative Upper chest: Negative IMPRESSION: No evidence of acute intracranial or cervical spine injury. Electronically Signed   By: Tiburcio Pea M.D.   On: 07/24/2023 12:52   DG Chest Portable 1 View Result Date: 07/24/2023 CLINICAL DATA:  Shortness of breath EXAM: PORTABLE CHEST 1 VIEW COMPARISON:  Chest radiograph 07/07/2023 and chest CT from 07/10/2019 FINDINGS: Mild cardiac enlargement. Aortic atherosclerosis. No pleural fluid, interstitial edema or airspace disease. Increased sclerosis involving the right humeral head, right clavicle and right scapula. This appears unchanged from the prior exam. These findings are better assessed on prior CT from 07/10/2019. IMPRESSION: 1. No acute cardiopulmonary abnormalities. 2. Mild cardiac enlargement. 3. Unchanged increased sclerosis involving the right humeral head, right clavicle and right scapula. Electronically Signed   By: Signa Kell M.D.   On: 07/24/2023 11:45    Microbiology: Results for orders placed or performed during the hospital encounter of 07/24/23  Resp panel by RT-PCR (RSV, Flu A&B, Covid) Anterior Nasal Swab     Status: None   Collection Time: 07/24/23 12:29 PM   Specimen: Anterior Nasal Swab  Result Value Ref Range Status   SARS Coronavirus 2 by RT PCR NEGATIVE NEGATIVE Final    Comment: (NOTE) SARS-CoV-2 target nucleic acids are NOT DETECTED.  The SARS-CoV-2 RNA is generally detectable in upper respiratory specimens during the acute phase of infection. The lowest concentration of SARS-CoV-2 viral copies this assay can detect is 138 copies/mL. A negative result does not preclude SARS-Cov-2 infection and should not be used as the sole basis for treatment or other patient management decisions. A negative result may occur with  improper  specimen collection/handling, submission of specimen other than nasopharyngeal swab, presence of viral mutation(s) within the areas targeted by this assay, and inadequate number of  viral copies(<138 copies/mL). A negative result must be combined with clinical observations, patient history, and epidemiological information. The expected result is Negative.  Fact Sheet for Patients:  BloggerCourse.com  Fact Sheet for Healthcare Providers:  SeriousBroker.it  This test is no t yet approved or cleared by the [place] FDA and  has been authorized for detection and/or diagnosis of SARS-CoV-2 by FDA under an Emergency Use Authorization (EUA). This EUA will remain  in effect (meaning this test can be used) for the duration of the COVID-19 declaration under Section 564(b)(1) of the Act, 21 U.S.C.section 360bbb-3(b)(1), unless the authorization is terminated  or revoked sooner.       Influenza A by PCR NEGATIVE NEGATIVE Final   Influenza B by PCR NEGATIVE NEGATIVE Final    Comment: (NOTE) The Xpert Xpress SARS-CoV-2/FLU/RSV plus assay is intended as an aid in the diagnosis of influenza from Nasopharyngeal swab specimens and should not be used as a sole basis for treatment. Nasal washings and aspirates are unacceptable for Xpert Xpress SARS-CoV-2/FLU/RSV testing.  Fact Sheet for Patients: BloggerCourse.com  Fact Sheet for Healthcare Providers: SeriousBroker.it  This test is not yet approved or cleared by the [place] FDA and has been authorized for detection and/or diagnosis of SARS-CoV-2 by FDA under an Emergency Use Authorization (EUA). This EUA will remain in effect (meaning this test can be used) for the duration of the COVID-19 declaration under Section 564(b)(1) of the Act, 21 U.S.C. section 360bbb-3(b)(1), unless the authorization is terminated or revoked.     Resp Syncytial Virus by PCR NEGATIVE NEGATIVE Final    Comment: (NOTE) Fact Sheet for Patients: BloggerCourse.com  Fact Sheet for Healthcare  Providers: SeriousBroker.it  This test is not yet approved or cleared by the [place] FDA and has been authorized for detection and/or diagnosis of SARS-CoV-2 by FDA under an Emergency Use Authorization (EUA). This EUA will remain in effect (meaning this test can be used) for the duration of the COVID-19 declaration under Section 564(b)(1) of the Act, 21 U.S.C. section 360bbb-3(b)(1), unless the authorization is terminated or revoked.  Performed at [facility], [address] [place], [place] [address]     Labs: CBC: Recent Labs  Lab 07/24/23 1126 07/25/23 0437  WBC 5.4 5.9  NEUTROABS 2.8  --   HGB 10.4* 9.9*  HCT 35.5* 33.4*  MCV 82.2 81.9  PLT 403* 392   Basic Metabolic Panel: Recent Labs  Lab 07/24/23 1126  NA 137  K 3.8  CL 102  CO2 27  GLUCOSE 155*  BUN 16  CREATININE 0.71  CALCIUM 9.0   Liver Function Tests: No results for input(s): "AST", "ALT", "ALKPHOS", "BILITOT", "PROT", "ALBUMIN" in the last 168 hours. CBG: Recent Labs  Lab 07/24/23 2309 07/25/23 0742 07/25/23 1106  GLUCAP 105* 99 109*    Discharge time spent: greater than 30 minutes.  Signed: Tyrone Nine, MD [facility] 07/25/2023

## 2023-07-25 NOTE — Progress Notes (Signed)
 PHARMACY - ANTICOAGULATION CONSULT NOTE  Pharmacy Consult for PTA warfarin Indication: atrial fibrillation  Allergies  Allergen Reactions   Mirapex [Pramipexole Dihydrochloride] Itching and Swelling   Penicillins Rash   Atorvastatin Other (See Comments)   Fosamax [Alendronate] Other (See Comments)   Gabapentin Other (See Comments)    Couldn't move legs during the night after taking.   Levothyroxine Itching   Symbicort [Budesonide-Formoterol Fumarate] Itching    Patient Measurements: Height: 5\' 5"  (165.1 cm) Weight: 82.2 kg (181 lb 3.5 oz) IBW/kg (Calculated) : 57  Vital Signs: Temp: 97.6 F (36.4 C) (02/17 0805) Temp Source: Oral (02/17 0805) BP: 160/79 (02/17 0805) Pulse Rate: 77 (02/17 0805)  Labs: Recent Labs    07/24/23 1126 07/24/23 1222 07/24/23 1357 07/25/23 0437  HGB 10.4*  --   --  9.9*  HCT 35.5*  --   --  33.4*  PLT 403*  --   --  392  LABPROT 18.6*  --   --  20.3*  INR 1.5*  --   --  1.7*  CREATININE 0.71  --   --   --   TROPONINIHS  --  18* 18*  --     Estimated Creatinine Clearance: 62.4 mL/min (by C-G formula based on SCr of 0.71 mg/dL).   Medical History: Past Medical History:  Diagnosis Date   Cancer (HCC)    breast    Coronary artery disease due to lipid rich plaque 04/13/2016   Diabetes mellitus without complication (HCC)    Diverticulosis of intestine without bleeding 12/13/2016   History of right mastectomy 11/15/2014   Hypothyroidism    Overactive bladder    Paget's disease of the bone    Paroxysmal atrial fibrillation (HCC) 06/19/2014   Restless leg syndrome     Medications:  Warfarin 5mg  PO every Sunday, Tuesday, Thursday and take 2.5 mg PO all other days  Assessment: 40 yoF presented to ED with difficulty walking/fall and concerns for TIA. PMH includes: diabetes mellitus, hypertension, paroxysmal atrial fibrillation. Pharmacy consulted to continue to dose warfarin.  -Hgb 9.9 -INR 1.5 > 1.7(subtherapeutic) -Last dose of  warfarin taken on: 5 mg PO x1 on 2/16 AM  Goal of Therapy:  INR 2-3 Monitor platelets by anticoagulation protocol: Yes   Plan:  Warfarin 4 mg x 1 dose. -Daily PT/INR   Judeth Cornfield, PharmD Clinical Pharmacist 07/25/2023 10:55 AM

## 2023-07-25 NOTE — Plan of Care (Signed)
   Problem: Education: Goal: Knowledge of General Education information will improve Description: Including pain rating scale, medication(s)/side effects and non-pharmacologic comfort measures Outcome: Progressing   Problem: Clinical Measurements: Goal: Ability to maintain clinical measurements within normal limits will improve Outcome: Progressing

## 2023-07-25 NOTE — Progress Notes (Signed)
 Transition of Care Department [facility]) has reviewed patient and no other TOC needs have been identified at this time. We will continue to monitor patient advancement through interdisciplinary progression rounds. If new patient transition needs arise, please place a TOC consult.   07/25/23 1022  TOC Brief Assessment  Insurance and Status Reviewed  Patient has primary care physician Yes  Home environment has been reviewed Lives alone.  Prior level of function: Independent.  Prior/Current Home Services No current home services  Social Drivers of Health Review SDOH reviewed no interventions necessary  Readmission risk has been reviewed Yes  Transition of care needs no transition of care needs at this time

## 2023-07-25 NOTE — Plan of Care (Signed)
   Problem: Education: Goal: Knowledge of General Education information will improve Description Including pain rating scale, medication(s)/side effects and non-pharmacologic comfort measures Outcome: Progressing   Problem: Education: Goal: Knowledge of General Education information will improve Description Including pain rating scale, medication(s)/side effects and non-pharmacologic comfort measures Outcome: Progressing

## 2023-07-25 NOTE — Care Management Obs Status (Addendum)
 MEDICARE OBSERVATION STATUS NOTIFICATION   Patient Details  Name: Desiree Bowers MRN: [id number] Date of Birth: 08-30-1945   Medicare Observation Status Notification Given:  Yes  I, Karn Cassis, LCSW, verbally reviewed observation notice.  07/25/2023, 2:15 PM   Karn Cassis, LCSW 07/25/2023, 2:15 PM

## 2023-07-25 NOTE — Progress Notes (Signed)
 [facility] PROGRESS NOTE  Desiree Bowers (DOB: 08/10/1945) ZOX:[id number] PCP: Kirstie Peri, MD Outpatient Specialists: Cardiology, Dr. Wyline Mood  Brief Narrative: HPI: Desiree Bowers is a 78 y.o. female with medical history significant for diabetes mellitus, hypertension, paroxysmal atrial fibrillation.  Patient presented to the ED with complaints of difficulty walking.  She reports she woke up this morning at about 7:30 AM, and was leaning to the right while ambulating, and she could not walk a straight line.  She reports she fell onto her left, she did not hit her head..  She is unable to tell me how long this lasted. She ambulated here in the ED , and this has improved. No change in vision.  No change in speech.  No reports of facial asymmetry. She also reports about 6 weeks ago, she bent over and suddenly became short of breath.  Reports since then has breathing has not been back to normal.  Has a chronic unchanged cough.  No chest pain.  No lower extremity swelling.   ED Course: Blood pressure elevated 149-200 systolic.  Temperature 97.5.  Heart rate 76-93.  Respiratory rate 19-30. O2 sat greater than 95% on room air.  Unremarkable CBC BMP.   INR 1.5. Troponin 18 X 2. BNP 770. Chest x-ray negative for acute abnormality. Head and cervical CT without acute abnormality. MRI brain without acute intracranial abnormality, mild to moderate chronic small vessel ischemic changes within the cerebral white matter are similar to prior MRI. EDP talked to neurologist Dr. Selina Cooley, recommended admission for TIA due to ataxia that has resolved, especially as patient's INR is subtherapeutic, teleneurology consult also.  Subjective: Symptoms resolved.  Objective: BP (!) 160/79 (BP Location: Left Arm)   Pulse 77   Temp 97.6 F (36.4 C) (Oral)   Resp 19   Ht 5\' 5"  (1.651 m)   Wt 82.2 kg   SpO2 98%   BMI 30.16 kg/m   Gen: No distress Pulm: Clear, nonlabored  CV: RRR, no JVD supine GI: Soft,  NT, ND, +BS  Neuro: Alert and oriented. No new focal deficits. Ext: Warm, no deformities. Skin: No rashes, lesions or ulcers on visualized skin   Assessment & Plan: TIA: Transient ataxia with subsequent fall, now returned to baseline. Neuroimaging without acute changes. MRI did reveal mild-to-moderate chronic small vessel ischemic changes within the cerebral white matter, similar to the prior brain MRI of 08/29/2019. - MRA will not be completed until this evening, will DC if normal 2/18. - Carotid U/S still pending - Echocardiogram without CES. Results, especially as they relate to changes from Jan 2024 study were discussed with that patient's cardiologist. The patient has had no new cardiopulmonary symptoms of late. Will arrange close outpatient follow up. - Lipid panel shows dyslipidemia with LDL slightly above goal of 70 (at 84) and low HDL (31). Note unspecified atorvastatin intolerance. Dietary counseling was provided, suggest recheck LDL at follow up and if still elevated, consider start lowest dose rosuvastatin - Resume warfarin, pharmacy to dose.   PAF: Currently in NSR.  - Continue metoprolol - Continue coumadin, suggest cardiology follow up and further consideration of DOAC. INR at discharge is 1.7 with goal of 2-3. Instead of her typical 2.5mg  dose tonight, recommend 4mg  dose (provided prior to discharge).    T2DM: HbA1c at goal at 5.8%. This is diet-controlled.    HTN:  - Long term goal normotension, continue home metoprolol 50 daily   Hypothyroidism: TSH wnl at 2.899.  - Continue synthroid.  Chronic cough: Chronic unchanged cough.  Also reports episode of dyspnea about 6 weeks ago when she bends over, since then has not been back to baseline.  No signs of volume overload.  Weights have per chart stable to improved over the past. Chest x-ray clear.  BNP elevated at 770- no prior to compare.  - PCP follow up  Tyrone Nine, MD [facility] [email or web address] 07/25/2023,  4:46 PM

## 2023-07-25 NOTE — Evaluation (Signed)
 Occupational Therapy Evaluation Patient Details Name: Desiree Bowers MRN: [id number] DOB: August 18, 1945 Today's Date: 07/25/2023   History of Present Illness   78 y.o. F admitted on 07/23/22 due to difficulty ambulating with R sided lean. CT and MRI negative, work up for TIA. PMH significant for DM2, HTN, A-fib.     Clinical Impressions Pt admitted for concerns listed above. PTA pt reported that she was independent with all ADL's and IADL's, including driving, using no DME for functional mobility. At this time, she presents back at her baseline, able to complete all functional mobility and ADL's independently with no DME, vision and speech appear intact. Pt has no further skilled OT needs at this time and will be discharged from acute OT.      If plan is discharge home, recommend the following:         Functional Status Assessment   Patient has had a recent decline in their functional status and demonstrates the ability to make significant improvements in function in a reasonable and predictable amount of time.     Equipment Recommendations   None recommended by OT     Recommendations for Other Services         Precautions/Restrictions   Precautions Precautions: None Restrictions Weight Bearing Restrictions Per Provider Order: No     Mobility Bed Mobility Overal bed mobility: Independent             General bed mobility comments: From flat bed no difficulties    Transfers Overall transfer level: Independent Equipment used: None               General transfer comment: Indep      Balance Overall balance assessment: No apparent balance deficits (not formally assessed)                                         ADL either performed or assessed with clinical judgement   ADL Overall ADL's : Independent;At baseline                                       General ADL Comments: Indep, no concerns     Vision  Baseline Vision/History: 0 No visual deficits Ability to See in Adequate Light: 0 Adequate Patient Visual Report: No change from baseline Vision Assessment?: No apparent visual deficits     Perception         Praxis         Pertinent Vitals/Pain Pain Assessment Pain Assessment: No/denies pain     Extremity/Trunk Assessment Upper Extremity Assessment Upper Extremity Assessment: Right hand dominant;RUE deficits/detail RUE Deficits / Details: RUE weak from injury last year RUE Sensation: WNL RUE Coordination: WNL   Lower Extremity Assessment Lower Extremity Assessment: Defer to PT evaluation   Cervical / Trunk Assessment Cervical / Trunk Assessment: Normal   Communication Communication Communication: No apparent difficulties   Cognition Arousal: Alert Behavior During Therapy: WFL for tasks assessed/performed Cognition: No apparent impairments                               Following commands: Intact       Cueing  General Comments   Cueing Techniques: Verbal cues      Exercises  Shoulder Instructions      Home Living Family/patient expects to be discharged to:: Private residence Living Arrangements: Alone Available Help at Discharge: Family;Available PRN/intermittently Type of Home: House Home Access: Stairs to enter [employer] of Steps: 3 Entrance Stairs-Rails: None Home Layout: One level;Other (Comment) (1 step to bedroom/den)     Bathroom Shower/Tub: Chief Strategy Officer: Standard     Home Equipment: Shower seat          Prior Functioning/Environment Prior Level of Function : Independent/Modified Independent;Driving             Mobility Comments: No AD ADLs Comments: Indep    OT Problem List: Impaired balance (sitting and/or standing)   OT Treatment/Interventions:        OT Goals(Current goals can be found in the care plan section)   Acute Rehab OT Goals Patient Stated Goal: To go  home OT Goal Formulation: All assessment and education complete, DC therapy Time For Goal Achievement: 07/25/23 Potential to Achieve Goals: Good   OT Frequency:       Co-evaluation              AM-PAC OT "6 Clicks" Daily Activity     Outcome Measure Help from another person eating meals?: None Help from another person taking care of personal grooming?: None Help from another person toileting, which includes using toliet, bedpan, or urinal?: None Help from another person bathing (including washing, rinsing, drying)?: None Help from another person to put on and taking off regular upper body clothing?: None Help from another person to put on and taking off regular lower body clothing?: None 6 Click Score: 24   End of Session Nurse Communication: Mobility status  Activity Tolerance: Patient tolerated treatment well Patient left: in bed;with call bell/phone within reach  OT Visit Diagnosis: Muscle weakness (generalized) (M62.81)                Time: 7829-5621 OT Time Calculation (min): 26 min Charges:  OT General Charges $OT Visit: 1 Visit OT Evaluation $OT Eval Low Complexity: 1 Low OT Treatments $Self Care/Home Management : 8-22 mins  Trish Mage, OTR/L [facility]  Chanc Kervin Elane Bing Plume 07/25/2023, 9:40 AM

## 2023-07-25 NOTE — Progress Notes (Signed)
 Nurse at bedside,patient alert and oriented times four this am.No co pain or discomfort noted.Plan of care on going.

## 2023-07-25 NOTE — Consult Note (Signed)
 I connected with  Desiree Bowers on 07/25/23 by a video enabled telemedicine application and verified that I am speaking with the correct person using two identifiers.   I discussed the limitations of evaluation and management by telemedicine. The patient expressed understanding and agreed to proceed.  Location of [facility]: [facility] Location of physician: [facility]  Neurology Consultation Reason for Consult: stroke Referring Physician: Dr Brett Canales  CC: leaning towards right  History is obtained from: patient, chart review  HPI: Desiree Bowers is a 78 y.o. female with h/oA fib on coumadin, HTN and DM who presented with trouble walking and leaning towards right. Patient  states she noticed it when she woke up yesterday morning around 730-8am.  LKN 07/23/2023 night Event happened at home No tpa as outside window No thrombectomy as no LVO mRS 0   ROS: All other systems reviewed and negative except as noted in the HPI.   Past Medical History:  Diagnosis Date   Cancer [facility])    breast    Coronary artery disease due to lipid rich plaque 04/13/2016   Diabetes mellitus without complication (HCC)    Diverticulosis of intestine without bleeding 12/13/2016   History of right mastectomy 11/15/2014   Hypothyroidism    Overactive bladder    Paget's disease of the bone    Paroxysmal atrial fibrillation (HCC) 06/19/2014   Restless leg syndrome     Family History  Problem Relation Age of Onset   Breast cancer Mother    Colon cancer Father    Kidney cancer Father    Bladder Cancer Sister    Suicidality Brother    Heart attack Maternal Grandmother    Breast cancer Sister    Stomach cancer Sister    Ovarian cancer Sister    Breast cancer Sister    Breast cancer Sister    Diabetes Sister    Other Brother        parathyroid disorder; Engelmann's disease   Angelman syndrome Son    Luiz Blare' disease Daughter     Social History:  reports that she has quit smoking. She has  never used smokeless tobacco. She reports that she does not drink alcohol and does not use drugs.   Medications Prior to Admission  Medication Sig Dispense Refill Last Dose/Taking   furosemide (LASIX) 20 MG tablet Take 20 mg by mouth daily as needed for edema.    Taking As Needed   magnesium oxide (MAG-OX) 400 (241.3 Mg) MG tablet Take 400 mg by mouth daily.    Past Month   meloxicam (MOBIC) 7.5 MG tablet Take 7.5 mg by mouth as needed for pain.   Taking As Needed   metoprolol tartrate (LOPRESSOR) 50 MG tablet TAKE 1 TABLET BY MOUTH TWICE A DAY 180 tablet 1 07/24/2023 Morning   pantoprazole (PROTONIX) 40 MG tablet Take 40 mg by mouth daily.   Past Week   SYNTHROID 112 MCG tablet Take 112 mcg by mouth daily before breakfast.    07/24/2023 Morning   traZODone (DESYREL) 50 MG tablet Take 50 mg by mouth at bedtime.   Taking   warfarin (COUMADIN) 5 MG tablet TAKE 1/2 TO 1 TABLET DAILY AS DIRECTED BY COUMADIN CLINIC *[facility] ONLY* (Patient taking differently: TAKE 1/2 TO 1 TABLET DAILY AS DIRECTED BY COUMADIN CLINIC *[facility] ONLY* Take 1/2 tablet (2.5mg ) on Monday,Wednesday,Friday,Saturday and 1 tablet (5 mg) on Tuesday,Thursday and Sunday.) 70 tablet 1 07/24/2023 Morning   calcium carbonate (OSCAL) 1500 (600 Ca) MG TABS  tablet Take 2 tablets by mouth daily.  (Patient not taking: Reported on 07/25/2023)   Not Taking   loratadine (CLARITIN) 10 MG tablet Take 10 mg by mouth daily.      spironolactone (ALDACTONE) 25 MG tablet Take 25 mg by mouth daily. (Patient not taking: Reported on 07/25/2023)   Not Taking   tiZANidine (ZANAFLEX) 4 MG capsule Take by mouth.         Exam: Current vital signs: BP (!) 160/79 (BP Location: Left Arm)   Pulse 77   Temp 97.6 F (36.4 C) (Oral)   Resp 19   Ht 5\' 5"  (1.651 m)   Wt 82.2 kg   SpO2 98%   BMI 30.16 kg/m  Vital signs in last 24 hours: Temp:  [97.6 F (36.4 C)-98.6 F (37 C)] 97.6 F (36.4 C) (02/17 0805) Pulse Rate:  [77-93] 77 (02/17 0805) Resp:  [17-19]  19 (02/17 0805) BP: (140-200)/(67-113) 160/79 (02/17 0805) SpO2:  [95 %-98 %] 98 % (02/17 0805) Weight:  [82.2 kg] 82.2 kg (02/16 2143)   Physical Exam  Constitutional: Appears well-developed and well-nourished.  Psych: Affect appropriate to situation Neuro: AOX3, no asphasia, no dysarthria, CN grossly intact, antigravity in all extremities without drift, sensory intact, FTN intact  NIHSS 0  I have reviewed labs in epic and the results pertinent to this consultation are: CBC:  Recent Labs  Lab 07/24/23 1126 07/25/23 0437  WBC 5.4 5.9  NEUTROABS 2.8  --   HGB 10.4* 9.9*  HCT 35.5* 33.4*  MCV 82.2 81.9  PLT 403* 392    Basic Metabolic Panel:  Lab Results  Component Value Date   NA 137 07/24/2023   K 3.8 07/24/2023   CO2 27 07/24/2023   GLUCOSE 155 (H) 07/24/2023   BUN 16 07/24/2023   CREATININE 0.71 07/24/2023   CALCIUM 9.0 07/24/2023   GFRNONAA >60 07/24/2023   GFRAA 86 01/25/2020   Lipid Panel:  Lab Results  Component Value Date   LDLCALC 84 07/25/2023   HgbA1c:  Lab Results  Component Value Date   HGBA1C 5.8 (H) 07/25/2023   Urine Drug Screen: No results found for: "LABOPIA", "COCAINSCRNUR", "LABBENZ", "AMPHETMU", "THCU", "LABBARB"  Alcohol Level No results found for: "ETH"   I have reviewed the images obtained:  CT Head without contrast 07/24/2023: No evidence of acute intracranial injury.   MRI Brain wo contrast 07/25/2023:  No evidence of an acute intracranial abnormality. Mild-to-moderate chronic small vessel ischemic changes within the cerebral white matter, similar to the prior brain MRI of 08/29/2019.  ASSESSMENT/PLAN: 78yo F with afib on coumadin, subtherapeutic inr at 1.5 cam with feeling off balance and leaning towards right. Symptoms have since resolved   TIA - Likely cardio-embolic due to subtherapeutic INR  Recommendations: - Please check cost of newer anticoagulants, If not covered, recommend closer monitoring of INR to stay within  therapeutic range - LDL 85, listed allergy to atorva but doesn't specify what allergy. Discussed diet modification and if LDL stays above 75, consider statin or other lipid lowering agents  - MRA head ordered and pending - carotid US and TTE pending - if above tests wnl, ok to dc from neuro standpoint - Goal BP: normotension - PT/OT - Stroke education - F/u with neuro in 3 months ( order placed) - Discussed plan with Dr Jarvis Newcomer via secure chat   Thank you for allowing [place] to participate in the care of this patient. If you have any further questions, please contact  me or neurohospitalist.   Lindie Spruce Epilepsy [facility]

## 2023-07-26 DIAGNOSIS — R791 Abnormal coagulation profile: Secondary | ICD-10-CM | POA: Diagnosis present

## 2023-07-26 DIAGNOSIS — Z79890 Hormone replacement therapy (postmenopausal): Secondary | ICD-10-CM | POA: Diagnosis not present

## 2023-07-26 DIAGNOSIS — R9431 Abnormal electrocardiogram [ECG] [EKG]: Secondary | ICD-10-CM | POA: Diagnosis not present

## 2023-07-26 DIAGNOSIS — R0609 Other forms of dyspnea: Secondary | ICD-10-CM | POA: Diagnosis not present

## 2023-07-26 DIAGNOSIS — F411 Generalized anxiety disorder: Secondary | ICD-10-CM | POA: Diagnosis present

## 2023-07-26 DIAGNOSIS — R270 Ataxia, unspecified: Secondary | ICD-10-CM | POA: Diagnosis present

## 2023-07-26 DIAGNOSIS — Z7901 Long term (current) use of anticoagulants: Secondary | ICD-10-CM | POA: Diagnosis not present

## 2023-07-26 DIAGNOSIS — I429 Cardiomyopathy, unspecified: Secondary | ICD-10-CM

## 2023-07-26 DIAGNOSIS — Z7982 Long term (current) use of aspirin: Secondary | ICD-10-CM | POA: Diagnosis not present

## 2023-07-26 DIAGNOSIS — E785 Hyperlipidemia, unspecified: Secondary | ICD-10-CM | POA: Diagnosis present

## 2023-07-26 DIAGNOSIS — Z853 Personal history of malignant neoplasm of breast: Secondary | ICD-10-CM | POA: Diagnosis not present

## 2023-07-26 DIAGNOSIS — M1990 Unspecified osteoarthritis, unspecified site: Secondary | ICD-10-CM | POA: Diagnosis present

## 2023-07-26 DIAGNOSIS — I480 Paroxysmal atrial fibrillation: Secondary | ICD-10-CM | POA: Diagnosis present

## 2023-07-26 DIAGNOSIS — I2510 Atherosclerotic heart disease of native coronary artery without angina pectoris: Secondary | ICD-10-CM | POA: Diagnosis present

## 2023-07-26 DIAGNOSIS — E1169 Type 2 diabetes mellitus with other specified complication: Secondary | ICD-10-CM | POA: Diagnosis present

## 2023-07-26 DIAGNOSIS — I272 Pulmonary hypertension, unspecified: Secondary | ICD-10-CM | POA: Diagnosis present

## 2023-07-26 DIAGNOSIS — I5043 Acute on chronic combined systolic (congestive) and diastolic (congestive) heart failure: Secondary | ICD-10-CM | POA: Diagnosis not present

## 2023-07-26 DIAGNOSIS — I8390 Asymptomatic varicose veins of unspecified lower extremity: Secondary | ICD-10-CM | POA: Diagnosis present

## 2023-07-26 DIAGNOSIS — I081 Rheumatic disorders of both mitral and tricuspid valves: Secondary | ICD-10-CM | POA: Diagnosis present

## 2023-07-26 DIAGNOSIS — G459 Transient cerebral ischemic attack, unspecified: Secondary | ICD-10-CM | POA: Diagnosis present

## 2023-07-26 DIAGNOSIS — I5021 Acute systolic (congestive) heart failure: Secondary | ICD-10-CM | POA: Diagnosis present

## 2023-07-26 DIAGNOSIS — G2581 Restless legs syndrome: Secondary | ICD-10-CM | POA: Diagnosis present

## 2023-07-26 DIAGNOSIS — R053 Chronic cough: Secondary | ICD-10-CM | POA: Diagnosis present

## 2023-07-26 DIAGNOSIS — I5020 Unspecified systolic (congestive) heart failure: Secondary | ICD-10-CM | POA: Diagnosis not present

## 2023-07-26 DIAGNOSIS — E039 Hypothyroidism, unspecified: Secondary | ICD-10-CM | POA: Diagnosis present

## 2023-07-26 DIAGNOSIS — I110 Hypertensive heart disease with heart failure: Secondary | ICD-10-CM | POA: Diagnosis present

## 2023-07-26 DIAGNOSIS — I5042 Chronic combined systolic (congestive) and diastolic (congestive) heart failure: Secondary | ICD-10-CM | POA: Diagnosis not present

## 2023-07-26 DIAGNOSIS — Z1152 Encounter for screening for COVID-19: Secondary | ICD-10-CM | POA: Diagnosis not present

## 2023-07-26 DIAGNOSIS — I2489 Other forms of acute ischemic heart disease: Secondary | ICD-10-CM | POA: Diagnosis present

## 2023-07-26 LAB — CBC
HCT: 32.4 % — ABNORMAL LOW (ref 36.0–46.0)
Hemoglobin: 9.6 g/dL — ABNORMAL LOW (ref 12.0–15.0)
MCH: 24.2 pg — ABNORMAL LOW (ref 26.0–34.0)
MCHC: 29.6 g/dL — ABNORMAL LOW (ref 30.0–36.0)
MCV: 81.6 fL (ref 80.0–100.0)
Platelets: 388 10*3/uL (ref 150–400)
RBC: 3.97 MIL/uL (ref 3.87–5.11)
RDW: 21.9 % — ABNORMAL HIGH (ref 11.5–15.5)
WBC: 6.1 10*3/uL (ref 4.0–10.5)
nRBC: 0 % (ref 0.0–0.2)

## 2023-07-26 LAB — PROTIME-INR
INR: 1.6 — ABNORMAL HIGH (ref 0.8–1.2)
Prothrombin Time: 19.6 s — ABNORMAL HIGH (ref 11.4–15.2)

## 2023-07-26 LAB — GLUCOSE, CAPILLARY
Glucose-Capillary: 104 mg/dL — ABNORMAL HIGH (ref 70–99)
Glucose-Capillary: 118 mg/dL — ABNORMAL HIGH (ref 70–99)
Glucose-Capillary: 136 mg/dL — ABNORMAL HIGH (ref 70–99)
Glucose-Capillary: 120 mg/dL — ABNORMAL HIGH (ref 70–99)

## 2023-07-26 MED ORDER — FUROSEMIDE 10 MG/ML IJ SOLN
20.0000 mg | Freq: Once | INTRAMUSCULAR | Status: AC
  Administered 2023-07-26: 20 mg via INTRAVENOUS
  Filled 2023-07-26: qty 2

## 2023-07-26 MED ORDER — HEPARIN (PORCINE) 25000 UT/250ML-% IV SOLN
1250.0000 [IU]/h | INTRAVENOUS | Status: DC
  Administered 2023-07-26: 1100 [IU]/h via INTRAVENOUS
  Administered 2023-07-27: 1400 [IU]/h via INTRAVENOUS
  Filled 2023-07-26 (×3): qty 250

## 2023-07-26 MED ORDER — POTASSIUM CHLORIDE CRYS ER 20 MEQ PO TBCR
20.0000 meq | EXTENDED_RELEASE_TABLET | Freq: Once | ORAL | Status: AC
  Administered 2023-07-26: 20 meq via ORAL
  Filled 2023-07-26: qty 1

## 2023-07-26 MED ORDER — WARFARIN SODIUM 5 MG PO TABS: 5.0000 mg | ORAL_TABLET | Freq: Once | ORAL | Status: DC

## 2023-07-26 NOTE — Progress Notes (Signed)
 [facility] PROGRESS NOTE  Desiree Bowers (DOB: 1945-08-03) ZOX:[id number] PCP: Kirstie Peri, MD Outpatient Specialists: Cardiology, Dr. Wyline Mood  Brief Narrative: HPI: Desiree Bowers is a 78 y.o. female with medical history significant for diabetes mellitus, hypertension, paroxysmal atrial fibrillation.  Patient presented to the ED with complaints of difficulty walking.  She reports she woke up this morning at about 7:30 AM, and was leaning to the right while ambulating, and she could not walk a straight line.  She reports she fell onto her left, she did not hit her head..  She is unable to tell me how long this lasted. She ambulated here in the ED , and this has improved. No change in vision.  No change in speech.  No reports of facial asymmetry. She also reports about 6 weeks ago, she bent over and suddenly became short of breath.  Reports since then has breathing has not been back to normal.  Has a chronic unchanged cough.  No chest pain.  No lower extremity swelling.   ED Course: Blood pressure elevated 149-200 systolic.  Temperature 97.5.  Heart rate 76-93.  Respiratory rate 19-30. O2 sat greater than 95% on room air.  Unremarkable CBC BMP.   INR 1.5. Troponin 18 X 2. BNP 770. Chest x-ray negative for acute abnormality. Head and cervical CT without acute abnormality. MRI brain without acute intracranial abnormality, mild to moderate chronic small vessel ischemic changes within the cerebral white matter are similar to prior MRI. EDP talked to neurologist Dr. Selina Cooley, recommended admission for TIA due to ataxia that has resolved, especially as patient's INR is subtherapeutic, teleneurology consult also.  TIA work up has been reassuring, though echocardiogram revealed new cardiomyopathy. The patient does, in fact, report progressive dyspnea on exertion, so cardiology is consulted.   Subjective: No further ataxia/dizziness. She denies chest pain now or recently. No dyspnea currently and  denies this until her daughter is on speaker phone stating she has grown more and more short of breath, now so severe that she cannot walk across her house without stopping and gasping. This has been slowly progressively worsening for almost a year.   Objective: BP (!) 155/72   Pulse 79   Temp 97.7 F (36.5 C) (Oral)   Resp 20   Ht 5\' 5"  (1.651 m)   Wt 82.2 kg   SpO2 97%   BMI 30.16 kg/m   Gen: Well-appearing female in no distress Pulm: Clear, nonlabored  CV: RRR, no MRG or pitting edema GI: Soft, NT, ND, +BS  Neuro: Alert and oriented. No new focal deficits. Ext: Warm, no deformities. Skin: No rashes, lesions or ulcers on visualized skin   Assessment & Plan: TIA: Transient ataxia with subsequent fall, now returned to baseline. Neuroimaging without acute changes. MRI did reveal mild-to-moderate chronic small vessel ischemic changes within the cerebral white matter, similar to the prior brain MRI of 08/29/2019. MRA nonacute. Carotid U/S without significant stenosis.  - Echocardiogram without CES but new cardiomyopathy as discussed below.  - Lipid panel shows dyslipidemia with LDL slightly above goal of 70 (at 84) and low HDL (31). Note unspecified atorvastatin intolerance. Dietary counseling was provided, suggest recheck LDL at follow up and if still elevated, consider start lowest dose rosuvastatin. Pt and family amenable to this plan. - Continue anticoagulation (hold coumadin pre-cath and bridge with heparin).  - Follow up with neurology after discharge.    PAF: NSR on admission - Continue metoprolol - Continue anticoagulation. D/w cardiology who plans catheterization  later this week, will hold coumadin. Start heparin gtt, especially in light of subtherapeutic INR.   T2DM: HbA1c at goal at 5.8%. This is diet-controlled.    HTN:  - Long term goal normotension, continue home metoprolol 50 daily   Hypothyroidism: TSH wnl at 2.899.  - Continue synthroid.    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 BNP elevated at 770 without prior for comparison. Serial echo's during a time period of several months show progressive changes mirroring her dyspnea on exertion. CXR and lungs clear.  - Appreciate cardiology consultation. Likely to pursue cardiac catheterization this admission to work up new cardiomyopathy. Also planning to trial lasix IV.   Tyrone Nine, MD [facility] [email or web address] 07/26/2023, 3:14 PM

## 2023-07-26 NOTE — Progress Notes (Signed)
   07/26/23 1500  ReDS Vest / Clip  Station Marker A  Ruler Value 35  ReDS Value Range < 36  ReDS Actual Value 26

## 2023-07-26 NOTE — Progress Notes (Signed)
 Patient alert,and oriented times four this am. No c/o pain or discomfort noted. Patient sitting up eating her breakfast,very pleasant.Plan of care on going.

## 2023-07-26 NOTE — Progress Notes (Deleted)
 Attending Note  Patient seen and discussed with PA Iran Ouch, I agree with her documentation. 78 yo female history of PAF, HTN, DM, chronic LE edema, admitted 07/24/23 with difficultly walking. Worked up for TIA like symptoms this admission by primary team and neurology. As part of workup echo was ordered which showed new findings of mild systolic dysfunction as well as pulmonary HTN. Patient has had 6 months of progressive SOB/DOE and orthopnea. Denies any LE edema, no chest pains.     WBC 5.4 Hgb 10.4 Plt 403 K 3.8 Cr 0.71 BUN 16 BNP 770 Ddimer neg TSH 2.899 LDL 84  Trop 18-->18 EKG: NSR, no ischemic changes CXR no acute process CT head: no acute process MRI brain: no acute process MRA: no acute findings Carotid US: no significant stenosis Echo: LVEF 45-50%, grade III dd, mild D shaped septum, mild RV dysfunction, mod RVE, mod pulm HTN, mild LAE, mod MR, mod to severe TR.     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  - Echo: LVEF 45-50%, grade III dd, mild D shaped septum, mild RV dysfunction, mod RVE, mod pulm HTN, mild LAE, mod MR, mod to severe TR.  - BNP 770, CXR no acute process - exam not overally impressive but elevated BNP, dilated fixed IVC. Checks rest vest - dose total of 40mg  of IV lasix today - given progression of symptoms and echo findings would plan for RHC/LHC. We discussed inpt vs outpt, family favors inpatient, makes sense as well since her coumadin is already subtherapeutic as well. Cath likely Thurs or Friday. -neuro symptoms questionable for TIA, imaging has been benign, would not delay cath for this reason.    2. PAF - DOACs were too expensive, has been on coumadin - subtherapeutic, from notes neuro thinks possible cardioembolic event. Would bridge with heparin while coumadin on hold for probable cath - check on costs of DOACs while admitted for her   3. TIA like  symptoms - neuro suspect cardioembolic given coumadin was subtherapeutic - no significant findings by imaging.   4. Pulmonary HTN - f/u upcoming R[facility]. Certaintly has left heart disease, pending RHC numbers may warrant further evaluation   Dina Rich MD

## 2023-07-26 NOTE — Progress Notes (Signed)
 Nurse at bedside,orders received no coumadin today.Plan of care on going.

## 2023-07-26 NOTE — Consult Note (Addendum)
 Cardiology Consultation   Patient ID: Desiree Bowers MRN: [id number]; DOB: 1945/12/03  Admit date: 07/24/2023 Date of Consult: 07/26/2023  PCP:  Kirstie Peri, MD   [facility] Providers Cardiologist:  Dina Rich, MD        Patient Profile:   Desiree Bowers is a 78 y.o. female with a hx of paroxysmal atrial fibrillation, HTN, hypothyroidism and mitral valve regurgitation who is being seen 07/26/2023 for the evaluation of abnormal echocardiogram at the request of Dr. Jarvis Newcomer.  History of Present Illness:   Desiree Bowers presented to [facility] ED on 07/24/2023 after having worsening dizziness and a fall at home. Denied any acute changes in vision or slurred speech. Initial labs showed WBC 5.4, Hgb 10.4, platelets 403, Na+ 137, K+ 3.8 and creatinine 0.71. INR 1.5. BNP elevated at 770.  Negative for COVID, influenza and RSV. D-dimer negative. Initial and repeat Hs troponin values at 18. TSH 2.899. FLP shows total cholesterol 136 and LDL at 84. Hemoglobin A1c at 5.8. EKG showed normal sinus rhythm, HR 75 with no acute ST changes. CXR shows no acute cardiopulmonary abnormalities. CT Head showed no acute intracranial abnormalities. MRI Brain showed mild to moderate chronic small vessel ischemic changes but no acute intracranial abnormalities. Carotid Doppler showed no significant stenosis.  She did have a repeat echocardiogram as part of her workup which showed her EF is reduced at 45 to 50% with global hypokinesis. Also noted to have grade 3 diastolic dysfunction, mildly reduced RV function, a small pericardial effusion, moderate MR and moderate to severe TR. Prior echo in 06/2022 showed a preserved EF of 60 to 65% with grade 1 diastolic dysfunction, normal RV function, mild MR and mild TR.  In talking with the patient, her son and daughter (who joined by phone), she reports having worsening dyspnea on exertion for at least the past 6+ months and possibly longer. Notices this with minimal  activity such as walking around her home. Denies any associated chest pressure or pain. Does report having intermittent dyspnea at night which she describes having a "panic attack" and symptoms improve when she sits up and takes Xanax. Has not tried Lasix during that timeframe. She only takes this as needed as she has awful cramps with it. No recent abdominal distention or lower extremity edema. She did have an echocardiogram with her PCP last week and by review of the report, it was read as showing her EF was 60% with grade 2 diastolic dysfunction, moderate TR and moderate pulmonary hypertension.   Past Medical History:  Diagnosis Date   Cancer [facility])    breast    Coronary artery disease due to lipid rich plaque 04/13/2016   Diabetes mellitus without complication (HCC)    Diverticulosis of intestine without bleeding 12/13/2016   History of right mastectomy 11/15/2014   Hypothyroidism    Overactive bladder    Paget's disease of the bone    Paroxysmal atrial fibrillation (HCC) 06/19/2014   Restless leg syndrome     Past Surgical History:  Procedure Laterality Date   ABDOMINAL HYSTERECTOMY  1999   APPENDECTOMY     as a child   CATARACT EXTRACTION Bilateral Fall 2013   Dr. Emmit Pomfret   CATARACT EXTRACTION, BILATERAL     EYE SURGERY Bilateral Fall 2013   Cat Sx - Dr. Emmit Pomfret   MASTECTOMY Right      Home Medications:  Prior to Admission medications   Medication Sig Start Date End Date Taking? Authorizing Provider  furosemide (LASIX) 20 MG tablet Take 20 mg by mouth daily as needed for edema.  02/16/17  Yes [provider]  magnesium oxide (MAG-OX) 400 (241.3 Mg) MG tablet Take 400 mg by mouth daily.    Yes [provider]  meloxicam (MOBIC) 7.5 MG tablet Take 7.5 mg by mouth as needed for pain.   Yes [provider]  metoprolol tartrate (LOPRESSOR) 50 MG tablet TAKE 1 TABLET BY MOUTH TWICE A DAY 05/23/23  Yes Holliday Sheaffer, Dorothe Pea, MD  pantoprazole (PROTONIX) 40 MG tablet  Take 40 mg by mouth daily. 03/02/21  Yes [provider]  SYNTHROID 112 MCG tablet Take 112 mcg by mouth daily before breakfast.  04/21/17  Yes [provider]  traZODone (DESYREL) 50 MG tablet Take 50 mg by mouth at bedtime. 03/25/23  Yes [provider]  warfarin (COUMADIN) 5 MG tablet TAKE 1/2 TO 1 TABLET DAILY AS DIRECTED BY COUMADIN CLINIC *[facility] ONLY* Patient taking differently: TAKE 1/2 TO 1 TABLET DAILY AS DIRECTED BY COUMADIN CLINIC *[facility] ONLY* Take 1/2 tablet (2.5mg ) on Monday,Wednesday,Friday,Saturday and 1 tablet (5 mg) on Tuesday,Thursday and Sunday. 02/08/23  Yes BranchDorothe Pea, MD  calcium carbonate (OSCAL) 1500 (600 Ca) MG TABS tablet Take 2 tablets by mouth daily.  Patient not taking: Reported on 07/25/2023    [provider]  loratadine (CLARITIN) 10 MG tablet Take 10 mg by mouth daily.    [provider]  spironolactone (ALDACTONE) 25 MG tablet Take 25 mg by mouth daily. Patient not taking: Reported on 07/25/2023 03/18/23   [provider]  tiZANidine (ZANAFLEX) 4 MG capsule Take by mouth. 07/22/23   [provider]    Inpatient Medications: Scheduled Meds:  enoxaparin (LOVENOX) injection  40 mg Subcutaneous Q24H   insulin aspart  0-5 Units Subcutaneous QHS   insulin aspart  0-9 Units Subcutaneous TID WC   levothyroxine  112 mcg Oral QAC breakfast   metoprolol tartrate  50 mg Oral BID   pantoprazole  40 mg Oral Daily   Warfarin - Pharmacist Dosing Inpatient   Does not apply q1600   Continuous Infusions:  PRN Meds: acetaminophen **OR** acetaminophen (TYLENOL) oral liquid 160 mg/5 mL **OR** acetaminophen, hydrOXYzine, labetalol, senna-docusate  Allergies:    Allergies  Allergen Reactions   Mirapex [Pramipexole Dihydrochloride] Itching and Swelling   Penicillins Rash   Atorvastatin Other (See Comments)   Fosamax [Alendronate] Other (See Comments)   Gabapentin Other (See Comments)    Couldn't move legs  during the night after taking.   Levothyroxine Itching   Symbicort [Budesonide-Formoterol Fumarate] Itching    Social History:   Social History   Socioeconomic History   Marital status: Widowed    Spouse name: Not on file   Number of children: 4   Years of education: Not on file   Highest education level: GED or equivalent  Occupational History   Occupation: Retired  Tobacco Use   Smoking status: Former   Smokeless tobacco: Never  Advertising account planner   Vaping status: Never Used  Substance and Sexual Activity   Alcohol use: No   Drug use: No   Sexual activity: Not Currently  Other Topics Concern   Not on file  Social History Narrative   Not on file    Family History:    Family History  Problem Relation Age of Onset   Breast cancer Mother    Colon cancer Father    Kidney cancer Father    Bladder Cancer Sister  Suicidality Brother    Heart attack Maternal Grandmother    Breast cancer Sister    Stomach cancer Sister    Ovarian cancer Sister    Breast cancer Sister    Breast cancer Sister    Diabetes Sister    Other Brother        parathyroid disorder; Engelmann's disease   Angelman syndrome Son    Luiz Blare' disease Daughter      ROS:  Please see the history of present illness.   All other ROS reviewed and negative.     Physical Exam/Data:   Vitals:   07/25/23 2207 07/25/23 2248 07/26/23 0336 07/26/23 0903  BP: (!) 151/87 (!) 151/87 (!) 154/80 (!) 164/84  Pulse: 88 88 82 80  Resp: 18  18 18   Temp: 98.2 F (36.8 C)  98.2 F (36.8 C) (!) 97.5 F (36.4 C)  TempSrc:    Oral  SpO2: 97%  98% 97%  Weight:      Height:        Intake/Output Summary (Last 24 hours) at 07/26/2023 1137 Last data filed at 07/26/2023 0500 Gross per 24 hour  Intake 240 ml  Output --  Net 240 ml      07/24/2023    9:43 PM 07/24/2023   11:07 AM 06/29/2023   11:06 AM  Last 3 Weights  Weight (lbs) 181 lb 3.5 oz 183 lb 185 lb  Weight (kg) 82.2 kg 83.008 kg 83.915 kg     Body mass  index is 30.16 kg/m.  General:  Well nourished, well developed female appearing in no acute distress HEENT: normal Neck: no JVD Vascular: No carotid bruits; Distal pulses 2+ bilaterally Cardiac:  normal S1, S2; RRR; no murmur  Lungs:  clear to auscultation bilaterally, no wheezing, rhonchi or rales  Abd: soft, nontender, no hepatomegaly  Ext: no pitting edema Musculoskeletal:  No deformities, BUE and BLE strength normal and equal Skin: warm and dry  Neuro:  CNs 2-12 intact, no focal abnormalities noted Psych:  Normal affect   EKG:  The EKG was personally reviewed and demonstrates: normal sinus rhythm, heart 75 with no acute ST changes. Telemetry:  Telemetry was personally reviewed and demonstrates: Not connected to telemetry.   Relevant CV Studies:  Echocardiogram: 07/2023 IMPRESSIONS     1. Left ventricular ejection fraction, by estimation, is 45 to 50%. The  left ventricle has mildly decreased function. The left ventricle  demonstrates global hypokinesis. Left ventricular diastolic parameters are  consistent with Grade III diastolic  dysfunction (restrictive). Elevated left atrial pressure. There is the  interventricular septum is flattened in systole and diastole, consistent  with right ventricular pressure and volume overload.   2. Right ventricular systolic function is mildly reduced. The right  ventricular size is moderately enlarged. There is moderately elevated  pulmonary artery systolic pressure.   3. Left atrial size was mildly dilated.   4. A small pericardial effusion is present. The pericardial effusion is  anterior to the right ventricle.   5. The mitral valve is abnormal. Moderate mitral valve regurgitation. No  evidence of mitral stenosis.   6. The tricuspid valve is abnormal. Tricuspid valve regurgitation is  moderate to severe.   7. The aortic valve is tricuspid. There is mild calcification of the  aortic valve. There is mild thickening of the aortic valve.  Aortic valve  regurgitation is mild. No aortic stenosis is present.   8. The inferior vena cava is dilated in size with <50% respiratory  variability, suggesting right atrial pressure of 15 mmHg.    Laboratory Data:  High Sensitivity Troponin:   Recent Labs  Lab 07/24/23 1222 07/24/23 1357  TROPONINIHS 18* 18*     Chemistry Recent Labs  Lab 07/24/23 1126  NA 137  K 3.8  CL 102  CO2 27  GLUCOSE 155*  BUN 16  CREATININE 0.71  CALCIUM 9.0  GFRNONAA >60  ANIONGAP 8    No results for input(s): "PROT", "ALBUMIN", "AST", "ALT", "ALKPHOS", "BILITOT" in the last 168 hours. Lipids  Recent Labs  Lab 07/25/23 0437  CHOL 136  TRIG 106  HDL 31*  LDLCALC 84  CHOLHDL 4.4    Hematology Recent Labs  Lab 07/24/23 1126 07/25/23 0437 07/26/23 0326  WBC 5.4 5.9 6.1  RBC 4.32 4.08 3.97  HGB 10.4* 9.9* 9.6*  HCT 35.5* 33.4* 32.4*  MCV 82.2 81.9 81.6  MCH 24.1* 24.3* 24.2*  MCHC 29.3* 29.6* 29.6*  RDW 21.7* 21.6* 21.9*  PLT 403* 392 388   Thyroid  Recent Labs  Lab 07/25/23 0437  TSH 2.899    BNP Recent Labs  Lab 07/24/23 1223  BNP 770.0*    DDimer  Recent Labs  Lab 07/24/23 1357  DDIMER <0.27     Radiology/Studies:  US Carotid Bilateral (at [facility] and [facility] only) Result Date: 07/26/2023 CLINICAL DATA:  Transient ischemic attack EXAM: BILATERAL CAROTID DUPLEX ULTRASOUND TECHNIQUE: Wallace Cullens scale imaging, color Doppler and duplex ultrasound were performed of bilateral carotid and vertebral arteries in the neck. COMPARISON:  None Available. FINDINGS: Criteria: Quantification of carotid stenosis is based on velocity parameters that correlate the residual internal carotid diameter with NASCET-based stenosis levels, using the diameter of the distal internal carotid lumen as the denominator for stenosis measurement. The following velocity measurements were obtained: RIGHT ICA: 114/18 cm/sec CCA: 62/74 cm/sec SYSTOLIC ICA/CCA RATIO:  2.1 ECA:  52 cm/sec LEFT ICA: 102/21 cm/sec  CCA: 11/14 cm/sec SYSTOLIC ICA/CCA RATIO:  2.2 ECA:  35 cm/sec RIGHT CAROTID ARTERY: No significant atherosclerotic plaque or evidence of stenosis in the internal carotid artery. RIGHT VERTEBRAL ARTERY:  Patent with normal antegrade flow. LEFT CAROTID ARTERY: No significant atherosclerotic plaque or evidence of stenosis. LEFT VERTEBRAL ARTERY:  Patent with normal antegrade flow. IMPRESSION: Negative bilateral carotid duplex ultrasound. Electronically Signed   By: Malachy Moan M.D.   On: 07/26/2023 11:10   MR ANGIO HEAD WO CONTRAST Result Date: 07/25/2023 CLINICAL DATA:  Stroke.  Follow-up. EXAM: MRA HEAD WITHOUT CONTRAST TECHNIQUE: Angiographic images of the Circle of Willis were acquired using MRA technique without intravenous contrast. COMPARISON:  Brain MRI yesterday. FINDINGS: Both internal carotid arteries are widely patent into the brain. No siphon stenosis. The anterior and middle cerebral vessels are patent without proximal stenosis, aneurysm or vascular malformation. Both vertebral arteries are widely patent to the basilar. No basilar stenosis. Posterior circulation [place] vessels appear normal. IMPRESSION: Normal intracranial MR angiography of the large and medium size vessels. Electronically Signed   By: Paulina Fusi M.D.   On: 07/25/2023 19:24  MR BRAIN WO CONTRAST Result Date: 07/24/2023 CLINICAL DATA:  Provided history: Neuro deficit, acute, stroke suspected. Fall this morning due to imbalance. EXAM: MRI HEAD WITHOUT CONTRAST TECHNIQUE: Multiplanar, multiecho pulse sequences of the brain and surrounding structures were obtained without intravenous contrast. COMPARISON:  Head CT 07/24/2023.  Brain MRI 08/29/2019. FINDINGS: Brain: No age-advanced or lobar predominant cerebral atrophy. Multifocal T2 FLAIR hyperintense signal abnormality within the cerebral white matter, nonspecific but compatible with mild-to-moderate chronic  small vessel ischemic disease. There is no acute infarct. No evidence  of an intracranial mass. No chronic intracranial blood products. No extra-axial fluid collection. No midline shift. Vascular: Maintained flow voids within the proximal large arterial vessels. Skull and upper cervical spine: No focal worrisome marrow lesion. Sinuses/Orbits: No mass or acute finding within the imaged orbits. Prior bilateral ocular lens replacement. No significant paranasal sinus disease. IMPRESSION: 1.  No evidence of an acute intracranial abnormality. 2. Mild-to-moderate chronic small vessel ischemic changes within the cerebral white matter, similar to the prior brain MRI of 08/29/2019. Electronically Signed   By: Jackey Loge D.O.   On: 07/24/2023 16:04   CT Head Wo Contrast Result Date: 07/24/2023 CLINICAL DATA:  Neck trauma.  Minor head trauma. EXAM: CT HEAD WITHOUT CONTRAST CT CERVICAL SPINE WITHOUT CONTRAST TECHNIQUE: Multidetector CT imaging of the head and cervical spine was performed following the standard protocol without intravenous contrast. Multiplanar CT image reconstructions of the cervical spine were also generated. RADIATION DOSE REDUCTION: This exam was performed according to the departmental dose-optimization program which includes automated exposure control, adjustment of the mA and/or kV according to patient size and/or use of iterative reconstruction technique. COMPARISON:  Brain MRI 08/29/2019 FINDINGS: CT HEAD FINDINGS Brain: No evidence of acute infarction, hemorrhage, hydrocephalus, extra-axial collection or mass lesion/mass effect. Cerebral volume loss and white matter low-density that is mild for age. Vascular: No hyperdense vessel or unexpected calcification. Skull: Normal. Negative for fracture or focal lesion. Sinuses/Orbits: No evidence of injury CT CERVICAL SPINE FINDINGS Alignment: No traumatic malalignment Skull base and vertebrae: No acute fracture. No primary bone lesion or focal pathologic process. Generalized osteopenia. Coarsened trabecula a and cortical  irregularity involving the right clavicle and covered right first and second ribs, there is history of both Paget's disease and breast cancer treatment-stable appearance since CT of the chest in 2021 Soft tissues and spinal canal: No prevertebral fluid or swelling. No visible canal hematoma. Disc levels:  Negative Upper chest: Negative IMPRESSION: No evidence of acute intracranial or cervical spine injury. Electronically Signed   By: Tiburcio Pea M.D.   On: 07/24/2023 12:52   CT Cervical Spine Wo Contrast Result Date: 07/24/2023 CLINICAL DATA:  Neck trauma.  Minor head trauma. EXAM: CT HEAD WITHOUT CONTRAST CT CERVICAL SPINE WITHOUT CONTRAST TECHNIQUE: Multidetector CT imaging of the head and cervical spine was performed following the standard protocol without intravenous contrast. Multiplanar CT image reconstructions of the cervical spine were also generated. RADIATION DOSE REDUCTION: This exam was performed according to the departmental dose-optimization program which includes automated exposure control, adjustment of the mA and/or kV according to patient size and/or use of iterative reconstruction technique. COMPARISON:  Brain MRI 08/29/2019 FINDINGS: CT HEAD FINDINGS Brain: No evidence of acute infarction, hemorrhage, hydrocephalus, extra-axial collection or mass lesion/mass effect. Cerebral volume loss and white matter low-density that is mild for age. Vascular: No hyperdense vessel or unexpected calcification. Skull: Normal. Negative for fracture or focal lesion. Sinuses/Orbits: No evidence of injury CT CERVICAL SPINE FINDINGS Alignment: No traumatic malalignment Skull base and vertebrae: No acute fracture. No primary bone lesion or focal pathologic process. Generalized osteopenia. Coarsened trabecula a and cortical irregularity involving the right clavicle and covered right first and second ribs, there is history of both Paget's disease and breast cancer treatment-stable appearance since CT of the chest  in 2021 Soft tissues and spinal canal: No prevertebral fluid or swelling. No visible canal hematoma. Disc levels:  Negative Upper chest: Negative IMPRESSION: No evidence  of acute intracranial or cervical spine injury. Electronically Signed   By: Tiburcio Pea M.D.   On: 07/24/2023 12:52   DG Chest Portable 1 View Result Date: 07/24/2023 CLINICAL DATA:  Shortness of breath EXAM: PORTABLE CHEST 1 VIEW COMPARISON:  Chest radiograph 07/07/2023 and chest CT from 07/10/2019 FINDINGS: Mild cardiac enlargement. Aortic atherosclerosis. No pleural fluid, interstitial edema or airspace disease. Increased sclerosis involving the right humeral head, right clavicle and right scapula. This appears unchanged from the prior exam. These findings are better assessed on prior CT from 07/10/2019. IMPRESSION: 1. No acute cardiopulmonary abnormalities. 2. Mild cardiac enlargement. 3. Unchanged increased sclerosis involving the right humeral head, right clavicle and right scapula. Electronically Signed   By: Signa Kell M.D.   On: 07/24/2023 11:45     Assessment and Plan:   1. Abnormal Echocardiogram/HFmrEF/Dyspnea on Exertion - She reports a 6+ month history of worsening dyspnea on exertion and denies any associated chest pain. She has experienced episodes which sound most consistent with orthopnea and PND and she does not take Lasix routinely due to frequent cramping. - BNP elevated at 770 on admission and repeat echocardiogram shows her EF is reduced at 45 to 50% with global hypokinesis along with grade 3 diastolic dysfunction and mildly reduced RV function. Also noted to have moderate MR and moderate to severe TR. - Unclear etiology of her new cardiomyopathy as EF was previously normal last year and they report she had an echo earlier this month and EF was read as normal then. Given her episodes of PND last night, will dose IV Lasix 20 mg x 1 as she has not received IV Lasix this admission. Will review with Dr. Wyline Mood  but can either adjust GDMT and reassess as an outpatient or could consider R/LHC this admission but will likely be postponed given her possible neurologic event.  2. Paroxysmal Atrial Fibrillation - Not currently connected to telemetry but was in normal sinus rhythm at the time of admission and is in normal sinus rhythm by examination. She is currently on Lopressor 50 mg twice daily. - She is on Coumadin for anticoagulation and INR is at 1.6 today. Dosing being managed by pharmacy.  3. Possible TIA - Presented with ataxia and subsequent fall and workup has overall been reassuring thus far.  Neurology feels that her episode could have been consistent with a TIA. INR was subtherapeutic and dosing has been adjusted by Pharmacy.    For questions or updates, please contact [facility] Please consult [email or web address] for contact info under    Signed, Ellsworth Lennox, PA-C  07/26/2023 11:37 AM  Attending Note   Patient seen and discussed with PA Iran Ouch, I agree with her documentation. 78 yo female history of PAF, HTN, DM, chronic LE edema, admitted 07/24/23 with difficultly walking. Worked up for TIA like symptoms this admission by primary team and neurology. As part of workup echo was ordered which showed new findings of mild systolic dysfunction as well as pulmonary HTN. Patient has had 6 months of progressive SOB/DOE and orthopnea. Denies any LE edema, no chest pains.        WBC 5.4 Hgb 10.4 Plt 403 K 3.8 Cr 0.71 BUN 16 BNP 770 Ddimer neg TSH 2.899 LDL 84  Trop 18-->18 EKG: NSR, no ischemic changes CXR no acute process CT head: no acute process MRI brain: no acute process MRA: no acute findings Carotid US: no significant stenosis Echo: LVEF 45-50%, grade III dd, mild D shaped septum,  mild RV dysfunction, mod RVE, mod pulm HTN, mild LAE, mod MR, mod to severe TR.        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  - Echo: LVEF 45-50%, grade III dd, mild D shaped septum, mild RV dysfunction, mod RVE, mod pulm HTN, mild LAE, mod MR, mod to severe TR.  - BNP 770, CXR no acute process - exam not overally impressive but elevated BNP, dilated fixed IVC. Checks rest vest - dose total of 40mg  of IV lasix today - given progression of symptoms and echo findings would plan for RHC/LHC. We discussed inpt vs outpt, family favors inpatient, makes sense as well since her coumadin is already subtherapeutic as well. Cath likely Thurs or Friday. -neuro symptoms questionable for TIA, imaging has been benign, would not delay cath for this reason.      2. PAF - DOACs were too expensive, has been on coumadin - subtherapeutic, from notes neuro thinks possible cardioembolic event. Would bridge with heparin while coumadin on hold for probable cath - check on costs of DOACs while admitted for her    3. TIA like symptoms - neuro suspect cardioembolic given coumadin was subtherapeutic - no significant findings by imaging.    4. Pulmonary HTN - f/u upcoming RHC. Certaintly has left heart disease, pending RHC numbers may warrant further evaluation     Dina Rich MD

## 2023-07-26 NOTE — Progress Notes (Signed)
 PHARMACY - ANTICOAGULATION CONSULT NOTE  Pharmacy Consult for PTA warfarin >> heparin Indication: atrial fibrillation  Allergies  Allergen Reactions   Mirapex [Pramipexole Dihydrochloride] Itching and Swelling   Penicillins Rash   Atorvastatin Other (See Comments)   Fosamax [Alendronate] Other (See Comments)   Gabapentin Other (See Comments)    Couldn't move legs during the night after taking.   Levothyroxine Itching   Symbicort [Budesonide-Formoterol Fumarate] Itching    Patient Measurements: Height: 5\' 5"  (165.1 cm) Weight: 82.2 kg (181 lb 3.5 oz) IBW/kg (Calculated) : 57  Vital Signs: Temp: 97.7 F (36.5 C) (02/18 1355) Temp Source: Oral (02/18 1355) BP: 155/72 (02/18 1355) Pulse Rate: 79 (02/18 1355)  Labs: Recent Labs    07/24/23 1126 07/24/23 1222 07/24/23 1357 07/25/23 0437 07/26/23 0326  HGB 10.4*  --   --  9.9* 9.6*  HCT 35.5*  --   --  33.4* 32.4*  PLT 403*  --   --  392 388  LABPROT 18.6*  --   --  20.3* 19.6*  INR 1.5*  --   --  1.7* 1.6*  CREATININE 0.71  --   --   --   --   TROPONINIHS  --  18* 18*  --   --     Estimated Creatinine Clearance: 62.4 mL/min (by C-G formula based on SCr of 0.71 mg/dL).   Medical History: Past Medical History:  Diagnosis Date   Cancer (HCC)    breast    Coronary artery disease due to lipid rich plaque 04/13/2016   Diabetes mellitus without complication (HCC)    Diverticulosis of intestine without bleeding 12/13/2016   History of right mastectomy 11/15/2014   Hypothyroidism    Overactive bladder    Paget's disease of the bone    Paroxysmal atrial fibrillation (HCC) 06/19/2014   Restless leg syndrome     Medications:  Warfarin 5mg  PO every Sunday, Tuesday, Thursday and take 2.5 mg PO all other days  Assessment: 41 yoF presented to ED with difficulty walking/fall and concerns for TIA.  Pharmacy consulted to dose warfarin but now holding due to plan for RHC/LHC on Thursday or Friday. INR currently subtherapeutic at  1.6.   -Hgb 9.6 -INR 1.5 > 1.7> 1.6   Goal of Therapy:  INR 2-3 Monitor platelets by anticoagulation protocol: Yes   Plan:  Hold warfarin Start heparin infusion at 1100 units/hr Heparin level in 8 hours and daily. Continue to monitor H&H and platelets.   Judeth Cornfield, PharmD Clinical Pharmacist 07/26/2023 3:17 PM

## 2023-07-26 NOTE — Progress Notes (Signed)
 PHARMACY - ANTICOAGULATION CONSULT NOTE  Pharmacy Consult for PTA warfarin Indication: atrial fibrillation  Allergies  Allergen Reactions   Mirapex [Pramipexole Dihydrochloride] Itching and Swelling   Penicillins Rash   Atorvastatin Other (See Comments)   Fosamax [Alendronate] Other (See Comments)   Gabapentin Other (See Comments)    Couldn't move legs during the night after taking.   Levothyroxine Itching   Symbicort [Budesonide-Formoterol Fumarate] Itching    Patient Measurements: Height: 5\' 5"  (165.1 cm) Weight: 82.2 kg (181 lb 3.5 oz) IBW/kg (Calculated) : 57  Vital Signs: Temp: 97.5 F (36.4 C) (02/18 0903) Temp Source: Oral (02/18 0903) BP: 164/84 (02/18 0903) Pulse Rate: 80 (02/18 0903)  Labs: Recent Labs    07/24/23 1126 07/24/23 1222 07/24/23 1357 07/25/23 0437 07/26/23 0326  HGB 10.4*  --   --  9.9* 9.6*  HCT 35.5*  --   --  33.4* 32.4*  PLT 403*  --   --  392 388  LABPROT 18.6*  --   --  20.3* 19.6*  INR 1.5*  --   --  1.7* 1.6*  CREATININE 0.71  --   --   --   --   TROPONINIHS  --  18* 18*  --   --     Estimated Creatinine Clearance: 62.4 mL/min (by C-G formula based on SCr of 0.71 mg/dL).   Medical History: Past Medical History:  Diagnosis Date   Cancer (HCC)    breast    Coronary artery disease due to lipid rich plaque 04/13/2016   Diabetes mellitus without complication (HCC)    Diverticulosis of intestine without bleeding 12/13/2016   History of right mastectomy 11/15/2014   Hypothyroidism    Overactive bladder    Paget's disease of the bone    Paroxysmal atrial fibrillation (HCC) 06/19/2014   Restless leg syndrome     Medications:  Warfarin 5mg  PO every Sunday, Tuesday, Thursday and take 2.5 mg PO all other days  Assessment: 43 yoF presented to ED with difficulty walking/fall and concerns for TIA. PMH includes: diabetes mellitus, hypertension, paroxysmal atrial fibrillation. Pharmacy consulted to continue to dose warfarin.  -Hgb  9.6 -INR 1.5 > 1.7> 1.6   Goal of Therapy:  INR 2-3 Monitor platelets by anticoagulation protocol: Yes   Plan:  Warfarin 5 mg x 1 dose. -Daily PT/INR   Judeth Cornfield, PharmD Clinical Pharmacist 07/26/2023 10:35 AM

## 2023-07-26 NOTE — Plan of Care (Signed)
   Problem: Education: Goal: Knowledge of General Education information will improve Description Including pain rating scale, medication(s)/side effects and non-pharmacologic comfort measures Outcome: Progressing

## 2023-07-26 NOTE — Plan of Care (Signed)
  Problem: Education: Goal: Knowledge of General Education information will improve Description: Including pain rating scale, medication(s)/side effects and non-pharmacologic comfort measures Outcome: Completed/Met   Problem: Education: Goal: Knowledge of General Education information will improve Description: Including pain rating scale, medication(s)/side effects and non-pharmacologic comfort measures Outcome: Completed/Met

## 2023-07-27 ENCOUNTER — Other Ambulatory Visit (HOSPITAL_COMMUNITY): Payer: Self-pay

## 2023-07-27 DIAGNOSIS — I5042 Chronic combined systolic (congestive) and diastolic (congestive) heart failure: Secondary | ICD-10-CM

## 2023-07-27 DIAGNOSIS — G459 Transient cerebral ischemic attack, unspecified: Secondary | ICD-10-CM | POA: Diagnosis not present

## 2023-07-27 LAB — BASIC METABOLIC PANEL
Anion gap: 13 (ref 5–15)
BUN: 17 mg/dL (ref 8–23)
CO2: 24 mmol/L (ref 22–32)
Calcium: 9.1 mg/dL (ref 8.9–10.3)
Chloride: 100 mmol/L (ref 98–111)
Creatinine, Ser: 0.81 mg/dL (ref 0.44–1.00)
GFR, Estimated: >60 mL/min (ref >60)
Glucose, Bld: 107 mg/dL — ABNORMAL HIGH (ref 70–99)
Potassium: 3.7 mmol/L (ref 3.5–5.1)
Sodium: 137 mmol/L (ref 135–145)

## 2023-07-27 LAB — PROTIME-INR
INR: 1.6 — ABNORMAL HIGH (ref 0.8–1.2)
Prothrombin Time: 19.3 s — ABNORMAL HIGH (ref 11.4–15.2)

## 2023-07-27 LAB — GLUCOSE, CAPILLARY
Glucose-Capillary: 104 mg/dL — ABNORMAL HIGH (ref 70–99)
Glucose-Capillary: 117 mg/dL — ABNORMAL HIGH (ref 70–99)
Glucose-Capillary: 106 mg/dL — ABNORMAL HIGH (ref 70–99)
Glucose-Capillary: 148 mg/dL — ABNORMAL HIGH (ref 70–99)

## 2023-07-27 LAB — CBC
HCT: 35.3 % — ABNORMAL LOW (ref 36.0–46.0)
Hemoglobin: 10.5 g/dL — ABNORMAL LOW (ref 12.0–15.0)
MCH: 24 pg — ABNORMAL LOW (ref 26.0–34.0)
MCHC: 29.7 g/dL — ABNORMAL LOW (ref 30.0–36.0)
MCV: 80.8 fL (ref 80.0–100.0)
Platelets: 419 10*3/uL — ABNORMAL HIGH (ref 150–400)
RBC: 4.37 MIL/uL (ref 3.87–5.11)
RDW: 21.6 % — ABNORMAL HIGH (ref 11.5–15.5)
WBC: 6.3 10*3/uL (ref 4.0–10.5)
nRBC: 0 % (ref 0.0–0.2)

## 2023-07-27 LAB — MAGNESIUM: Magnesium: 1.8 mg/dL (ref 1.7–2.4)

## 2023-07-27 LAB — HEPARIN LEVEL (UNFRACTIONATED)
Heparin Unfractionated: 0.59 [IU]/mL (ref 0.30–0.70)
Heparin Unfractionated: 0.83 [IU]/mL — ABNORMAL HIGH (ref 0.30–0.70)
Heparin Unfractionated: <0.1 [IU]/mL — ABNORMAL LOW (ref 0.30–0.70)

## 2023-07-27 LAB — BRAIN NATRIURETIC PEPTIDE: B Natriuretic Peptide: 886 pg/mL — ABNORMAL HIGH (ref 0.0–100.0)

## 2023-07-27 MED ORDER — FUROSEMIDE 10 MG/ML IJ SOLN
40.0000 mg | Freq: Two times a day (BID) | INTRAMUSCULAR | Status: AC
  Administered 2023-07-27 (×2): 40 mg via INTRAVENOUS
  Filled 2023-07-27 (×2): qty 4

## 2023-07-27 MED ORDER — MAGNESIUM SULFATE 2 GM/50ML IV SOLN
2.0000 g | Freq: Once | INTRAVENOUS | Status: AC
  Administered 2023-07-27: 2 g via INTRAVENOUS
  Filled 2023-07-27: qty 50

## 2023-07-27 MED ORDER — TRAMADOL HCL 50 MG PO TABS
50.0000 mg | ORAL_TABLET | Freq: Once | ORAL | Status: AC
  Administered 2023-07-27: 50 mg via ORAL
  Filled 2023-07-27: qty 1

## 2023-07-27 MED ORDER — ACETAMINOPHEN 325 MG PO TABS: 650.0000 mg | ORAL_TABLET | Freq: Once | ORAL | Status: AC

## 2023-07-27 MED ORDER — METOPROLOL SUCCINATE ER 100 MG PO TB24
100.0000 mg | ORAL_TABLET | Freq: Every day | ORAL | Status: DC
  Administered 2023-07-28 – 2023-07-29 (×2): 100 mg via ORAL
  Filled 2023-07-27 (×2): qty 1

## 2023-07-27 MED ORDER — POTASSIUM CHLORIDE CRYS ER 20 MEQ PO TBCR
40.0000 meq | EXTENDED_RELEASE_TABLET | Freq: Four times a day (QID) | ORAL | Status: AC
  Administered 2023-07-27 (×2): 40 meq via ORAL
  Filled 2023-07-27 (×2): qty 2

## 2023-07-27 NOTE — Progress Notes (Signed)
 Nurse at bedside, started new IV 22 gauge to left posterior forearm,patient tolerated procedure. Plan of care on going.

## 2023-07-27 NOTE — Plan of Care (Signed)
   Problem: Activity: Goal: Risk for activity intolerance will decrease Outcome: Progressing   Problem: Nutrition: Goal: Adequate nutrition will be maintained Outcome: Progressing   Problem: Coping: Goal: Level of anxiety will decrease Outcome: Progressing

## 2023-07-27 NOTE — Progress Notes (Addendum)
 Rounding Note    Patient Name: Desiree Bowers Date of Encounter: 07/27/2023  [facility] Cardiologist: Dina Rich, MD   Subjective   Some ongoing SOB  Inpatient Medications    Scheduled Meds:  insulin aspart  0-5 Units Subcutaneous QHS   insulin aspart  0-9 Units Subcutaneous TID WC   levothyroxine  112 mcg Oral QAC breakfast   metoprolol tartrate  50 mg Oral BID   pantoprazole  40 mg Oral Daily   Continuous Infusions:  heparin 1,400 Units/hr (07/27/23 0107)   PRN Meds: acetaminophen **OR** acetaminophen (TYLENOL) oral liquid 160 mg/5 mL **OR** acetaminophen, hydrOXYzine, labetalol, senna-docusate   Vital Signs    Vitals:   07/26/23 1742 07/26/23 1944 07/27/23 0006 07/27/23 0424  BP: (!) 164/85 (!) 158/65 106/82 (!) 155/70  Pulse: 79 76 69 74  Resp: 18 19 19 19   Temp: (!) 97.4 F (36.3 C) (!) 97.5 F (36.4 C) 97.6 F (36.4 C) 97.9 F (36.6 C)  TempSrc: Oral Oral Oral Oral  SpO2: 99% 95% 96% 96%  Weight:      Height:        Intake/Output Summary (Last 24 hours) at 07/27/2023 1610 Last data filed at 07/27/2023 0500 Gross per 24 hour  Intake 241.38 ml  Output --  Net 241.38 ml      07/24/2023    9:43 PM 07/24/2023   11:07 AM 06/29/2023   11:06 AM  Last 3 Weights  Weight (lbs) 181 lb 3.5 oz 183 lb 185 lb  Weight (kg) 82.2 kg 83.008 kg 83.915 kg      Telemetry    N/a - Personally Reviewed  ECG    N/a - Personally Reviewed  Physical Exam   GEN: No acute distress.   Neck: No JVD Cardiac: RRR, no murmurs, rubs, or gallops.  Respiratory: Clear to auscultation bilaterally. GI: Soft, nontender, non-distended  MS: No edema; No deformity. Neuro:  Nonfocal  Psych: Normal affect   Labs    High Sensitivity Troponin:   Recent Labs  Lab 07/24/23 1222 07/24/23 1357  TROPONINIHS 18* 18*     Chemistry Recent Labs  Lab 07/24/23 1126  NA 137  K 3.8  CL 102  CO2 27  GLUCOSE 155*  BUN 16  CREATININE 0.71  CALCIUM 9.0   GFRNONAA >60  ANIONGAP 8    Lipids  Recent Labs  Lab 07/25/23 0437  CHOL 136  TRIG 106  HDL 31*  LDLCALC 84  CHOLHDL 4.4    Hematology Recent Labs  Lab 07/25/23 0437 07/26/23 0326 07/27/23 0012  WBC 5.9 6.1 6.3  RBC 4.08 3.97 4.37  HGB 9.9* 9.6* 10.5*  HCT 33.4* 32.4* 35.3*  MCV 81.9 81.6 80.8  MCH 24.3* 24.2* 24.0*  MCHC 29.6* 29.6* 29.7*  RDW 21.6* 21.9* 21.6*  PLT 392 388 419*   Thyroid  Recent Labs  Lab 07/25/23 0437  TSH 2.899    BNP Recent Labs  Lab 07/24/23 1223 07/27/23 0012  BNP 770.0* 886.0*    DDimer  Recent Labs  Lab 07/24/23 1357  DDIMER <0.27     Radiology    US Carotid Bilateral (at [facility] and [facility] only) Result Date: 07/26/2023 CLINICAL DATA:  Transient ischemic attack EXAM: BILATERAL CAROTID DUPLEX ULTRASOUND TECHNIQUE: Wallace Cullens scale imaging, color Doppler and duplex ultrasound were performed of bilateral carotid and vertebral arteries in the neck. COMPARISON:  None Available. FINDINGS: Criteria: Quantification of carotid stenosis is based on velocity parameters that correlate the residual internal  carotid diameter with NASCET-based stenosis levels, using the diameter of the distal internal carotid lumen as the denominator for stenosis measurement. The following velocity measurements were obtained: RIGHT ICA: 114/18 cm/sec CCA: 62/74 cm/sec SYSTOLIC ICA/CCA RATIO:  2.1 ECA:  52 cm/sec LEFT ICA: 102/21 cm/sec CCA: 11/14 cm/sec SYSTOLIC ICA/CCA RATIO:  2.2 ECA:  35 cm/sec RIGHT CAROTID ARTERY: No significant atherosclerotic plaque or evidence of stenosis in the internal carotid artery. RIGHT VERTEBRAL ARTERY:  Patent with normal antegrade flow. LEFT CAROTID ARTERY: No significant atherosclerotic plaque or evidence of stenosis. LEFT VERTEBRAL ARTERY:  Patent with normal antegrade flow. IMPRESSION: Negative bilateral carotid duplex ultrasound. Electronically Signed   By: Malachy Moan M.D.   On: 07/26/2023 11:10   MR ANGIO HEAD WO CONTRAST Result  Date: 07/25/2023 CLINICAL DATA:  Stroke.  Follow-up. EXAM: MRA HEAD WITHOUT CONTRAST TECHNIQUE: Angiographic images of the Circle of Willis were acquired using MRA technique without intravenous contrast. COMPARISON:  Brain MRI yesterday. FINDINGS: Both internal carotid arteries are widely patent into the brain. No siphon stenosis. The anterior and middle cerebral vessels are patent without proximal stenosis, aneurysm or vascular malformation. Both vertebral arteries are widely patent to the basilar. No basilar stenosis. Posterior circulation Trayton Szabo vessels appear normal. IMPRESSION: Normal intracranial MR angiography of the large and medium size vessels. Electronically Signed   By: Paulina Fusi M.D.   On: 07/25/2023 19:24   ECHOCARDIOGRAM COMPLETE Result Date: 07/25/2023    ECHOCARDIOGRAM REPORT   Patient Name:   Desiree Bowers Date of Exam: 07/25/2023 Medical Rec #:  [id number]      Height:       65.0 in Accession #:    [id number]     Weight:       181.2 lb Date of Birth:  Jun 09, 1945      BSA:          1.897 m Patient Age:    77 years       BP:           160/79 mmHg Patient Gender: F              HR:           77 bpm. [facility] Procedure: 2D Echo, Color Doppler and Cardiac Doppler (Both Spectral and Color            Flow Doppler were utilized during procedure). Indications:    TIA G45.9  History:        Patient has prior history of Echocardiogram examinations, most                 recent 06/14/2022. Arrythmias:Atrial Fibrillation; Risk                 Factors:Hypertension and Diabetes.  Sonographer:    Webb Laws Referring Phys: [id number] EJIROGHENE E EMOKPAE IMPRESSIONS  1. Left ventricular ejection fraction, by estimation, is 45 to 50%. The left ventricle has mildly decreased function. The left ventricle demonstrates global hypokinesis. Left ventricular diastolic parameters are consistent with Grade III diastolic dysfunction (restrictive). Elevated left atrial pressure. There is the  interventricular septum is flattened in systole and diastole, consistent with right ventricular pressure and volume overload.  2. Right ventricular systolic function is mildly reduced. The right ventricular size is moderately enlarged. There is moderately elevated pulmonary artery systolic pressure.  3. Left atrial size was mildly dilated.  4. A small pericardial effusion is present. The pericardial effusion is anterior to the right  ventricle.  5. The mitral valve is abnormal. Moderate mitral valve regurgitation. No evidence of mitral stenosis.  6. The tricuspid valve is abnormal. Tricuspid valve regurgitation is moderate to severe.  7. The aortic valve is tricuspid. There is mild calcification of the aortic valve. There is mild thickening of the aortic valve. Aortic valve regurgitation is mild. No aortic stenosis is present.  8. The inferior vena cava is dilated in size with <50% respiratory variability, suggesting right atrial pressure of 15 mmHg. FINDINGS  Left Ventricle: Left ventricular ejection fraction, by estimation, is 45 to 50%. The left ventricle has mildly decreased function. The left ventricle demonstrates global hypokinesis. Strain imaging was not performed. The left ventricular internal cavity  size was normal in size. There is no left ventricular hypertrophy. The interventricular septum is flattened in systole and diastole, consistent with right ventricular pressure and volume overload. Left ventricular diastolic parameters are consistent with Grade III diastolic dysfunction (restrictive). Elevated left atrial pressure. Right Ventricle: The right ventricular size is moderately enlarged. Right vetricular wall thickness was not well visualized. Right ventricular systolic function is mildly reduced. There is moderately elevated pulmonary artery systolic pressure. The tricuspid regurgitant velocity is 2.86 m/s, and with an assumed right atrial pressure of 15 mmHg, the estimated right ventricular systolic  pressure is 47.7 mmHg. Left Atrium: Left atrial size was mildly dilated. Right Atrium: Right atrial size was normal in size. Pericardium: A small pericardial effusion is present. The pericardial effusion is anterior to the right ventricle. Mitral Valve: The mitral valve is abnormal. Moderate mitral valve regurgitation. No evidence of mitral valve stenosis. Tricuspid Valve: The tricuspid valve is abnormal. Tricuspid valve regurgitation is moderate to severe. No evidence of tricuspid stenosis. Aortic Valve: The aortic valve is tricuspid. There is mild calcification of the aortic valve. There is mild thickening of the aortic valve. There is mild aortic valve annular calcification. Aortic valve regurgitation is mild. Aortic regurgitation PHT measures 640 msec. No aortic stenosis is present. Aortic valve mean gradient measures 5.2 mmHg. Aortic valve peak gradient measures 8.8 mmHg. Aortic valve area, by VTI measures 1.77 cm. Pulmonic Valve: The pulmonic valve was not well visualized. Pulmonic valve regurgitation is mild. No evidence of pulmonic stenosis. Aorta: The aortic root and ascending aorta are structurally normal, with no evidence of dilitation. Venous: The inferior vena cava is dilated in size with less than 50% respiratory variability, suggesting right atrial pressure of 15 mmHg. IAS/Shunts: The interatrial septum was not well visualized. Additional Comments: 3D imaging was not performed.  LEFT VENTRICLE PLAX 2D LVIDd:         5.30 cm   Diastology LVIDs:         2.40 cm   LV e' medial:    4.35 cm/s LV PW:         0.90 cm   LV E/e' medial:  28.5 LV IVS:        0.90 cm   LV e' lateral:   5.66 cm/s LVOT diam:     1.80 cm   LV E/e' lateral: 21.9 LV SV:         51 LV SV Index:   27 LVOT Area:     2.54 cm  RIGHT VENTRICLE            IVC RV Basal diam:  3.30 cm    IVC diam: 2.20 cm RV Mid diam:    2.50 cm RV S prime:     8.59 cm/s TAPSE (M-mode): 1.8  cm LEFT ATRIUM             Index        RIGHT ATRIUM            Index LA diam:        5.20 cm 2.74 cm/m   RA Area:     18.00 cm LA Vol (A2C):   79.0 ml 41.65 ml/m  RA Volume:   45.90 ml  24.20 ml/m LA Vol (A4C):   61.9 ml 32.63 ml/m LA Biplane Vol: 70.4 ml 37.11 ml/m  AORTIC VALVE AV Area (Vmax):    1.92 cm AV Area (Vmean):   1.81 cm AV Area (VTI):     1.77 cm AV Vmax:           148.60 cm/s AV Vmean:          109.162 cm/s AV VTI:            0.288 m AV Peak Grad:      8.8 mmHg AV Mean Grad:      5.2 mmHg LVOT Vmax:         112.00 cm/s LVOT Vmean:        77.800 cm/s LVOT VTI:          0.201 m LVOT/AV VTI ratio: 0.70 AI PHT:            640 msec  AORTA Ao Root diam: 2.50 cm Ao Asc diam:  3.20 cm MITRAL VALVE                 TRICUSPID VALVE MV Area (PHT): 5.97 cm      TR Peak grad:   32.7 mmHg MV Decel Time: 127 msec      TR Vmax:        286.00 cm/s MR Peak grad:   111.5 mmHg MR Mean grad:   72.0 mmHg    SHUNTS MR Vmax:        528.00 cm/s  Systemic VTI:  0.20 m MR Vmean:       397.5 cm/s   Systemic Diam: 1.80 cm MR PISA:        1.01 cm MR PISA Radius: 0.40 cm MV E velocity: 124.00 cm/s MV A velocity: 59.20 cm/s MV E/A ratio:  2.09 Dina Rich MD Electronically signed by Dina Rich MD Signature Date/Time: 07/25/2023/2:42:45 PM    Final     Cardiac Studies    Patient Profile     BILLI BRIGHT is a 78 y.o. female with a hx of paroxysmal atrial fibrillation, HTN, hypothyroidism and mitral valve regurgitation who is being seen 07/26/2023 for the evaluation of abnormal echocardiogram at the request of Dr. Jarvis Newcomer.   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  - Echo: LVEF 45-50%, grade III dd, mild D shaped septum, mild RV dysfunction, mod RVE, mod pulm HTN, mild LAE, mod MR, mod to severe TR.  - BNP 770, CXR no acute process - exam not overally impressive but elevated BNP 770-->886, dilated fixed IVC. Reds vest was 26%  -  received IV lasix 40mg  yesterday. I/Os are incomplete. Weights are pending. Labs are pending other than BNP which trended up. Would anticipate IV lasix 40mg  bid if renal function ok.   - given marked progression of symptoms and echo findings would plan for RHC/LHC. We discussed inpt vs outpt, family favors  inpatient, makes sense as well since her coumadin is already subtherapeutic. Cath likely Thurs or Friday. -neuro symptoms questionable for TIA, imaging has been benign, would not delay cath for this reason.   - bp's up and down. For now would just transition lopressor to toprol, additional HF regimen pending vitals.      2. PAF - DOACs were too expensive, has been on coumadin - subtherapeutic, from notes neuro thinks possible cardioembolic event. Would bridge with heparin while coumadin on hold for probable cath - check on costs of DOACs while admitted for her case management referral placed.   - pharmacy ran the two meds. Eliquis copay is $556.87, Xarelto copay is $555.29 , copays so high due to a $590.00 deductible    3. TIA like symptoms - neuro suspect cardioembolic given coumadin was subtherapeutic - no significant findings by imaging.    4. Pulmonary HTN -significant progression of DOE over last 6 months - evidence of pulmonary HTN by echo, mild D shaped septum - f/u upcoming RHC. Certaintly has left heart disease, pending RHC numbers may warrant further evaluation   For questions or updates, please contact [facility] Please consult [email or web address] for contact info under        Signed, Dina Rich, MD  07/27/2023, 8:22 AM

## 2023-07-27 NOTE — Progress Notes (Signed)
 PHARMACY - ANTICOAGULATION CONSULT NOTE  Pharmacy Consult for PTA warfarin >> heparin Indication: atrial fibrillation  Allergies  Allergen Reactions   Mirapex [Pramipexole Dihydrochloride] Itching and Swelling   Penicillins Rash   Atorvastatin Other (See Comments)   Fosamax [Alendronate] Other (See Comments)   Gabapentin Other (See Comments)    Couldn't move legs during the night after taking.   Levothyroxine Itching   Symbicort [Budesonide-Formoterol Fumarate] Itching    Patient Measurements: Height: 5\' 5"  (165.1 cm) Weight: 82.2 kg (181 lb 3.5 oz) IBW/kg (Calculated) : 57  Vital Signs: Temp: 98.7 F (37.1 C) (02/19 1248) Temp Source: Axillary (02/19 1248) BP: 154/55 (02/19 1414) Pulse Rate: 70 (02/19 1248)  Labs: Recent Labs    07/25/23 0437 07/26/23 0326 07/27/23 0012 07/27/23 0849 07/27/23 0900 07/27/23 1441  HGB 9.9* 9.6* 10.5*  --   --   --   HCT 33.4* 32.4* 35.3*  --   --   --   PLT 392 388 419*  --   --   --   LABPROT 20.3* 19.6* 19.3*  --   --   --   INR 1.7* 1.6* 1.6*  --   --   --   HEPARINUNFRC  --   --  <0.10* 0.59  --  0.83*  CREATININE  --   --   --   --  0.81  --     Estimated Creatinine Clearance: 61.6 mL/min (by C-G formula based on SCr of 0.81 mg/dL).   Medical History: Past Medical History:  Diagnosis Date   Cancer (HCC)    breast    Coronary artery disease due to lipid rich plaque 04/13/2016   Diabetes mellitus without complication (HCC)    Diverticulosis of intestine without bleeding 12/13/2016   History of right mastectomy 11/15/2014   Hypothyroidism    Overactive bladder    Paget's disease of the bone    Paroxysmal atrial fibrillation (HCC) 06/19/2014   Restless leg syndrome     Medications:  Warfarin 5mg  PO every Sunday, Tuesday, Thursday and take 2.5 mg PO all other days  Assessment: 51 yoF presented to ED with difficulty walking/fall and concerns for TIA.  Pharmacy consulted to dose warfarin but now holding due to plan for  RHC/LHC on Thursday or Friday. INR currently subtherapeutic at 1.6. Starting heparin.  HL 0.83- supratherapeutic -Hgb 10.5 -INR 1.5 > 1.7> 1.6   Goal of Therapy:  INR 2-3 Monitor platelets by anticoagulation protocol: Yes   Plan:  Decrease heparin infusion to 1250 units/hr Heparin level in 8 hours and daily. Continue to monitor H&H and platelets.   Judeth Cornfield, PharmD Clinical Pharmacist 07/27/2023 3:43 PM

## 2023-07-27 NOTE — Progress Notes (Signed)
 PHARMACY - ANTICOAGULATION CONSULT NOTE  Pharmacy Consult for heparin Indication: atrial fibrillation  Labs: Recent Labs    07/24/23 1126 07/24/23 1222 07/24/23 1357 07/25/23 0437 07/26/23 0326 07/27/23 0012  HGB 10.4*  --   --  9.9* 9.6* 10.5*  HCT 35.5*  --   --  33.4* 32.4* 35.3*  PLT 403*  --   --  392 388 419*  LABPROT 18.6*  --   --  20.3* 19.6* 19.3*  INR 1.5*  --   --  1.7* 1.6* 1.6*  HEPARINUNFRC  --   --   --   --   --  <0.10*  CREATININE 0.71  --   --   --   --   --   TROPONINIHS  --  18* 18*  --   --   --    Assessment: 77yo female subtherapeutic on heparin with initial dosing while warfarin on hold; no infusion issues or signs of bleeding per RN.  Goal of Therapy:  Heparin level 0.3-0.7 units/ml   Plan:  Increase heparin infusion by 4 units/kg/hr to 1400 units/hr. Check level in 8 hours.   Vernard Gambles, PharmD, BCPS 07/27/2023 12:57 AM

## 2023-07-27 NOTE — Progress Notes (Signed)
 Nurse at bedside,Heparin rate changed to 12.5 ml/hr,per MD's orders and Pharmacy.Plan of care on going

## 2023-07-27 NOTE — Progress Notes (Signed)
 Nurse at bedside,patient c/o of a cramping pain underneath both breast rated a 5,Dr courage notified, Ultram 50 mg's given by mouth for pain per [facility] per MD's orders. Plan of care on going.Marland Kitchen

## 2023-07-27 NOTE — Plan of Care (Signed)
  Problem: Nutrition: Goal: Adequate nutrition will be maintained Outcome: Progressing   Problem: Nutrition: Goal: Adequate nutrition will be maintained Outcome: Progressing   

## 2023-07-27 NOTE — Progress Notes (Signed)
 [facility] PROGRESS NOTE  Desiree Bowers (DOB: May 21, 1946) RUE:[id number] PCP: Kirstie Peri, MD Outpatient Specialists: Cardiology, Dr. Wyline Mood  Brief Narrative: HPI: Desiree Bowers is a 78 y.o. female with medical history significant for diabetes mellitus, hypertension, paroxysmal atrial fibrillation.  Patient presented to the ED with complaints of difficulty walking.  She reports she woke up this morning at about 7:30 AM, and was leaning to the right while ambulating, and she could not walk a straight line.  She reports she fell onto her left, she did not hit her head..  She is unable to tell me how long this lasted. She ambulated here in the ED , and this has improved. No change in vision.  No change in speech.  No reports of facial asymmetry. She also reports about 6 weeks ago, she bent over and suddenly became short of breath.  Reports since then has breathing has not been back to normal.  Has a chronic unchanged cough.  No chest pain.  No lower extremity swelling.   ED Course: Blood pressure elevated 149-200 systolic.  Temperature 97.5.  Heart rate 76-93.  Respiratory rate 19-30. O2 sat greater than 95% on room air.  Unremarkable CBC BMP.   INR 1.5. Troponin 18 X 2. BNP 770. Chest x-ray negative for acute abnormality. Head and cervical CT without acute abnormality. MRI brain without acute intracranial abnormality, mild to moderate chronic small vessel ischemic changes within the cerebral white matter are similar to prior MRI. EDP talked to neurologist Dr. Selina Cooley, recommended admission for TIA due to ataxia that has resolved, especially as patient's INR is subtherapeutic, teleneurology consult also.  TIA work up has been reassuring, though echocardiogram revealed new cardiomyopathy. The patient does, in fact, report progressive dyspnea on exertion, so cardiology is consulted.   Subjective:   Grand daughter Armando Gang at bedside,  No fever  Or chills  No Nausea, Vomiting or  Diarrhea -Voiding well, no hypoxia, dyspnea on exertion is not worse today --Patient and granddaughter agreeable to transfer to [facility] for RHC/LHC as recommended by cardiologist    Objective: BP (!) 154/70 (BP Location: Left Leg)   Pulse 72   Temp (!) 97.5 F (36.4 C) (Oral)   Resp 18   Ht 5\' 5"  (1.651 m)   Wt 80.5 kg   SpO2 99%   BMI 29.53 kg/m     Physical Exam Gen:- Awake Alert, in no acute distress , No conversational dyspnea, has dyspnea on exertion HEENT:- [place].AT, No sclera icterus Neck-Supple Neck,No JVD,.  Lungs- fair air movement bilaterally, No wheezing CV- S1, S2 normal, RRR Abd-  +ve B.Sounds, Abd Soft, No tenderness,    Extremity/Skin:- No  edema,   good pedal pulses  Psych-affect is appropriate, oriented x3 Neuro-no new focal deficits, no tremors   Assessment & Plan: 1)TIA: Transient ataxia with subsequent fall, now returned to baseline. Neuroimaging without acute changes. MRI did reveal mild-to-moderate chronic small vessel ischemic changes within the cerebral white matter, similar to the prior brain MRI of 08/29/2019. MRA nonacute. Carotid U/S without significant stenosis.  -INR was subtherapeutic suspect cardioembolic etiology of a TIA - Echocardiogram  with new cardiomyopathy noted  - LDL  84 and low HDL 31 -. Note unspecified atorvastatin intolerance. Dietary counseling was provided, suggest recheck LDL at follow up and if still elevated, consider start lowest dose rosuvastatin. Pt and family amenable to this plan. - Continue anticoagulation (hold coumadin pre-cath and bridge with heparin).  - Follow up with neurology  after discharge.   2)Combined HFmrEF:  - Jan 2024 echo: LVE 60-65%, grade I dd.  - Repeat Echo on 07/25/23---EF by estimation is 45 to 50%,  The left ventricle has mildly decreased function. The left ventricle demonstrates global hypokinesis. Left ventricular diastolic parameters are consistent with Grade III diastolic dysfunction (  restrictive) . Elevated left atrial pressure. There is the interventricular septum is flattened in systole and diastole, consistent with right ventricular pressure and volume overload. Right ventricular systolic function is mildly reduced. The right ventricular size is moderately. -elevated BNP 770-->886, dilated fixed IVC.  -Had trial of Lasix Wt 183 >>181>>177 -Voiding well, no hypoxia, dyspnea on exertion is not worse today -Cardiology consult appreciated, rec transfer to [facility] for RHC/LHC   3)PAF: NSR on admission - Continue metoprolol --D/w cardiology who plans catheterization later this week, hold coumadin. C/n heparin gtt, especially in light of subtherapeutic INR.  4)Pulm HTN--persistent dyspnea on exertion for the last 6 months or so, echo with pulmonary hypertension, mild D-shaped septum -Cardiology recommends transfer to [facility] for RHC   5)DM2--A1c 5.8 reflecting excellent diabetic control PTA  -Continue diet control -Use Novolog/Humalog Sliding scale insulin with Accu-Cheks/Fingersticks as ordered    6)HTN:  -Continue Toprol-XL 100 mg daily for BP and A-fib rate control -Continue labetalol as needed elevated BP   9)Hypothyroidism: TSH wnl at 2.899.  - Continue synthroid.      Shon Hale, MD [facility] [email or web address] 07/27/2023, 5:14 PM

## 2023-07-27 NOTE — Progress Notes (Signed)
 2130 Pt  transferred [facility] with Christy with no problems or c/o dr. Truitt Merle of transfer

## 2023-07-27 NOTE — Progress Notes (Signed)
 PHARMACY - ANTICOAGULATION CONSULT NOTE  Pharmacy Consult for PTA warfarin >> heparin Indication: atrial fibrillation  Allergies  Allergen Reactions   Mirapex [Pramipexole Dihydrochloride] Itching and Swelling   Penicillins Rash   Atorvastatin Other (See Comments)   Fosamax [Alendronate] Other (See Comments)   Gabapentin Other (See Comments)    Couldn't move legs during the night after taking.   Levothyroxine Itching   Symbicort [Budesonide-Formoterol Fumarate] Itching    Patient Measurements: Height: 5\' 5"  (165.1 cm) Weight: 82.2 kg (181 lb 3.5 oz) IBW/kg (Calculated) : 57  Vital Signs: Temp: 97.5 F (36.4 C) (02/19 0837) Temp Source: Oral (02/19 0837) BP: 139/88 (02/19 0837) Pulse Rate: 75 (02/19 0837)  Labs: Recent Labs    07/24/23 1126 07/24/23 1222 07/24/23 1357 07/25/23 0437 07/26/23 0326 07/27/23 0012 07/27/23 0849 07/27/23 0900  HGB 10.4*  --   --  9.9* 9.6* 10.5*  --   --   HCT 35.5*  --   --  33.4* 32.4* 35.3*  --   --   PLT 403*  --   --  392 388 419*  --   --   LABPROT 18.6*  --   --  20.3* 19.6* 19.3*  --   --   INR 1.5*  --   --  1.7* 1.6* 1.6*  --   --   HEPARINUNFRC  --   --   --   --   --  <0.10* 0.59  --   CREATININE 0.71  --   --   --   --   --   --  0.81  TROPONINIHS  --  18* 18*  --   --   --   --   --     Estimated Creatinine Clearance: 61.6 mL/min (by C-G formula based on SCr of 0.81 mg/dL).   Medical History: Past Medical History:  Diagnosis Date   Cancer (HCC)    breast    Coronary artery disease due to lipid rich plaque 04/13/2016   Diabetes mellitus without complication (HCC)    Diverticulosis of intestine without bleeding 12/13/2016   History of right mastectomy 11/15/2014   Hypothyroidism    Overactive bladder    Paget's disease of the bone    Paroxysmal atrial fibrillation (HCC) 06/19/2014   Restless leg syndrome     Medications:  Warfarin 5mg  PO every Sunday, Tuesday, Thursday and take 2.5 mg PO all other  days  Assessment: 47 yoF presented to ED with difficulty walking/fall and concerns for TIA.  Pharmacy consulted to dose warfarin but now holding due to plan for RHC/LHC on Thursday or Friday. INR currently subtherapeutic at 1.6. Starting heparin.  HL 0.59- therapeutic -Hgb 10.5 -INR 1.5 > 1.7> 1.6   Goal of Therapy:  INR 2-3 Monitor platelets by anticoagulation protocol: Yes   Plan:  Continue heparin infusion at 1400 units/hr Heparin level in 8 hours and daily. Continue to monitor H&H and platelets.   Judeth Cornfield, PharmD Clinical Pharmacist 07/27/2023 9:46 AM

## 2023-07-27 NOTE — Progress Notes (Addendum)
 Nurse at bedside,patient alert and oriented times four. Patient stated " I feel a little weak today". Patient c/o a cramp underneath left breast a dull pain rated a 2,patient refusing pain medication at this time.Plan of care on going.

## 2023-07-27 NOTE — Progress Notes (Signed)
   07/27/23 1800  ReDS Vest / Clip  Station Marker A  Ruler Value 30  ReDS Value Range < 36  ReDS Actual Value 23

## 2023-07-27 NOTE — TOC CM/SW Note (Signed)
 TOC consulted to see what med cost would be. CSW spoke to pharmacy who ran benefits check. Eliquis copay is $556.87, Xarelto copay is $555.29 , copays so high due to a $590.00 deductible.

## 2023-07-28 ENCOUNTER — Other Ambulatory Visit: Payer: Self-pay | Admitting: Cardiology

## 2023-07-28 ENCOUNTER — Encounter (HOSPITAL_COMMUNITY)
Admission: EM | Disposition: A | Discharge status: Home / Self Care | Payer: Self-pay | Source: Home / Self Care | Attending: Family Medicine

## 2023-07-28 DIAGNOSIS — I5043 Acute on chronic combined systolic (congestive) and diastolic (congestive) heart failure: Secondary | ICD-10-CM

## 2023-07-28 DIAGNOSIS — G459 Transient cerebral ischemic attack, unspecified: Secondary | ICD-10-CM | POA: Diagnosis not present

## 2023-07-28 DIAGNOSIS — I272 Pulmonary hypertension, unspecified: Secondary | ICD-10-CM

## 2023-07-28 DIAGNOSIS — I480 Paroxysmal atrial fibrillation: Secondary | ICD-10-CM

## 2023-07-28 HISTORY — PX: RIGHT/LEFT HEART CATH AND CORONARY ANGIOGRAPHY: CATH118266

## 2023-07-28 LAB — POCT I-STAT 7, (LYTES, BLD GAS, ICA,H+H)
Acid-Base Excess: 0 mmol/L (ref 0.0–2.0)
Acid-Base Excess: 1 mmol/L (ref 0.0–2.0)
Bicarbonate: 25.5 mmol/L (ref 20.0–28.0)
Bicarbonate: 27.5 mmol/L (ref 20.0–28.0)
Calcium, Ion: 1.15 mmol/L (ref 1.15–1.40)
Calcium, Ion: 1.26 mmol/L (ref 1.15–1.40)
HCT: 35 % — ABNORMAL LOW (ref 36.0–46.0)
HCT: 37 % (ref 36.0–46.0)
Hemoglobin: 11.9 g/dL — ABNORMAL LOW (ref 12.0–15.0)
Hemoglobin: 12.6 g/dL (ref 12.0–15.0)
O2 Saturation: 65 %
O2 Saturation: 96 %
Potassium: 3.9 mmol/L (ref 3.5–5.1)
Potassium: 4.2 mmol/L (ref 3.5–5.1)
Sodium: 140 mmol/L (ref 135–145)
Sodium: 142 mmol/L (ref 135–145)
TCO2: 27 mmol/L (ref 22–32)
TCO2: 29 mmol/L (ref 22–32)
pCO2 arterial: 44.8 mm[Hg] (ref 32–48)
pCO2 arterial: 50.1 mm[Hg] — ABNORMAL HIGH (ref 32–48)
pH, Arterial: 7.348 — ABNORMAL LOW (ref 7.35–7.45)
pH, Arterial: 7.364 (ref 7.35–7.45)
pO2, Arterial: 36 mm[Hg] — CL (ref 83–108)
pO2, Arterial: 82 mm[Hg] — ABNORMAL LOW (ref 83–108)

## 2023-07-28 LAB — CBC
HCT: 36.5 % (ref 36.0–46.0)
Hemoglobin: 10.9 g/dL — ABNORMAL LOW (ref 12.0–15.0)
MCH: 24.1 pg — ABNORMAL LOW (ref 26.0–34.0)
MCHC: 29.9 g/dL — ABNORMAL LOW (ref 30.0–36.0)
MCV: 80.6 fL (ref 80.0–100.0)
Platelets: 404 10*3/uL — ABNORMAL HIGH (ref 150–400)
RBC: 4.53 MIL/uL (ref 3.87–5.11)
RDW: 21.8 % — ABNORMAL HIGH (ref 11.5–15.5)
WBC: 5.7 10*3/uL (ref 4.0–10.5)
nRBC: 0 % (ref 0.0–0.2)

## 2023-07-28 LAB — BASIC METABOLIC PANEL
Anion gap: 10 (ref 5–15)
BUN: 17 mg/dL (ref 8–23)
CO2: 26 mmol/L (ref 22–32)
Calcium: 9.6 mg/dL (ref 8.9–10.3)
Chloride: 104 mmol/L (ref 98–111)
Creatinine, Ser: 0.75 mg/dL (ref 0.44–1.00)
GFR, Estimated: >60 mL/min (ref >60)
Glucose, Bld: 88 mg/dL (ref 70–99)
Potassium: 4.1 mmol/L (ref 3.5–5.1)
Sodium: 140 mmol/L (ref 135–145)

## 2023-07-28 LAB — GLUCOSE, CAPILLARY
Glucose-Capillary: 90 mg/dL (ref 70–99)
Glucose-Capillary: 103 mg/dL — ABNORMAL HIGH (ref 70–99)
Glucose-Capillary: 76 mg/dL (ref 70–99)
Glucose-Capillary: 79 mg/dL (ref 70–99)
Glucose-Capillary: 127 mg/dL — ABNORMAL HIGH (ref 70–99)

## 2023-07-28 LAB — HEPARIN LEVEL (UNFRACTIONATED): Heparin Unfractionated: 0.32 [IU]/mL (ref 0.30–0.70)

## 2023-07-28 LAB — PROTIME-INR
INR: 1.4 — ABNORMAL HIGH (ref 0.8–1.2)
Prothrombin Time: 17.4 s — ABNORMAL HIGH (ref 11.4–15.2)

## 2023-07-28 SURGERY — RIGHT/LEFT HEART CATH AND CORONARY ANGIOGRAPHY
Anesthesia: LOCAL

## 2023-07-28 MED ORDER — IOHEXOL 350 MG/ML SOLN
INTRAVENOUS | Status: DC | PRN
  Administered 2023-07-28: 25 mL

## 2023-07-28 MED ORDER — MIDAZOLAM HCL 2 MG/2ML IJ SOLN
INTRAMUSCULAR | Status: AC
  Filled 2023-07-28: qty 2

## 2023-07-28 MED ORDER — TRAZODONE HCL 50 MG PO TABS
50.0000 mg | ORAL_TABLET | Freq: Every day | ORAL | Status: DC
  Administered 2023-07-28: 50 mg via ORAL
  Filled 2023-07-28: qty 1

## 2023-07-28 MED ORDER — LIDOCAINE HCL (PF) 1 % IJ SOLN
INTRAMUSCULAR | Status: DC | PRN
  Administered 2023-07-28: 12 mL

## 2023-07-28 MED ORDER — WARFARIN SODIUM 5 MG PO TABS
5.0000 mg | ORAL_TABLET | Freq: Once | ORAL | Status: AC
  Administered 2023-07-28: 5 mg via ORAL
  Filled 2023-07-28 (×2): qty 1

## 2023-07-28 MED ORDER — ASPIRIN 81 MG PO TBEC
81.0000 mg | DELAYED_RELEASE_TABLET | Freq: Every day | ORAL | Status: DC
  Administered 2023-07-28 – 2023-07-29 (×2): 81 mg via ORAL
  Filled 2023-07-28 (×2): qty 1

## 2023-07-28 MED ORDER — HEPARIN (PORCINE) 25000 UT/250ML-% IV SOLN
1250.0000 [IU]/h | INTRAVENOUS | Status: DC
  Administered 2023-07-29: 1250 [IU]/h via INTRAVENOUS
  Filled 2023-07-28: qty 250

## 2023-07-28 MED ORDER — SODIUM CHLORIDE 0.9% FLUSH
3.0000 mL | Freq: Two times a day (BID) | INTRAVENOUS | Status: DC
  Administered 2023-07-28: 3 mL via INTRAVENOUS

## 2023-07-28 MED ORDER — FENTANYL CITRATE (PF) 100 MCG/2ML IJ SOLN
INTRAMUSCULAR | Status: AC
Start: 2023-07-28 — End: ?
  Filled 2023-07-28: qty 2

## 2023-07-28 MED ORDER — MIDAZOLAM HCL 2 MG/2ML IJ SOLN
INTRAMUSCULAR | Status: DC | PRN
  Administered 2023-07-28: 1 mg via INTRAVENOUS

## 2023-07-28 MED ORDER — HEPARIN SODIUM (PORCINE) 1000 UNIT/ML IJ SOLN
INTRAMUSCULAR | Status: AC
  Filled 2023-07-28: qty 10

## 2023-07-28 MED ORDER — PHENYLEPHRINE 80 MCG/ML (10ML) SYRINGE FOR IV PUSH (FOR BLOOD PRESSURE SUPPORT)
PREFILLED_SYRINGE | INTRAVENOUS | Status: AC
  Filled 2023-07-28: qty 10

## 2023-07-28 MED ORDER — ONDANSETRON HCL 4 MG/2ML IJ SOLN: 4.0000 mg | Freq: Four times a day (QID) | INTRAMUSCULAR | Status: DC | PRN

## 2023-07-28 MED ORDER — VERAPAMIL HCL 2.5 MG/ML IV SOLN
INTRAVENOUS | Status: AC
  Filled 2023-07-28: qty 2

## 2023-07-28 MED ORDER — LIDOCAINE HCL (PF) 1 % IJ SOLN
INTRAMUSCULAR | Status: AC
  Filled 2023-07-28: qty 30

## 2023-07-28 MED ORDER — FENTANYL CITRATE (PF) 100 MCG/2ML IJ SOLN
INTRAMUSCULAR | Status: DC | PRN
  Administered 2023-07-28: 25 ug via INTRAVENOUS

## 2023-07-28 MED ORDER — HYDRALAZINE HCL 20 MG/ML IJ SOLN
10.0000 mg | INTRAMUSCULAR | Status: AC | PRN
  Administered 2023-07-28: 10 mg via INTRAVENOUS

## 2023-07-28 MED ORDER — ACETAMINOPHEN 325 MG PO TABS
ORAL_TABLET | ORAL | Status: AC
  Filled 2023-07-28: qty 2

## 2023-07-28 MED ORDER — SODIUM CHLORIDE 0.9 % IV SOLN: 250.0000 mL | INTRAVENOUS | Status: AC | PRN

## 2023-07-28 MED ORDER — WARFARIN - PHARMACIST DOSING INPATIENT
Freq: Every day | Status: DC
Start: 2023-07-29 — End: 2023-07-29

## 2023-07-28 MED ORDER — SODIUM CHLORIDE 0.9% FLUSH: 3.0000 mL | INTRAVENOUS | Status: DC | PRN

## 2023-07-28 MED ORDER — HYDRALAZINE HCL 20 MG/ML IJ SOLN
INTRAMUSCULAR | Status: AC
  Filled 2023-07-28: qty 1

## 2023-07-28 SURGICAL SUPPLY — 15 items
CATH INFINITI 5FR JL4 (CATHETERS) IMPLANT
CATH INFINITI JR4 5F (CATHETERS) IMPLANT
CATH SWAN GANZ 7F STRAIGHT (CATHETERS) IMPLANT
CLOSURE MYNX CONTROL 5F (Vascular Products) IMPLANT
CLOSURE MYNX CONTROL 6F/7F (Vascular Products) IMPLANT
COVER PRB 48X5XTLSCP FOLD TPE (BAG) IMPLANT
GLIDESHEATH SLEND SS 6F .021 (SHEATH) IMPLANT
GUIDEWIRE INQWIRE 1.5J.035X260 (WIRE) IMPLANT
INQWIRE 1.5J .035X260CM (WIRE) ×1 IMPLANT
KIT SYRINGE INJ CVI SPIKEX1 (MISCELLANEOUS) IMPLANT
PACK CARDIAC CATHETERIZATION (CUSTOM PROCEDURE TRAY) ×2 IMPLANT
SET ATX-X65L (MISCELLANEOUS) IMPLANT
SHEATH PINNACLE 5F 10CM (SHEATH) IMPLANT
SHEATH PINNACLE 7F 10CM (SHEATH) IMPLANT
WIRE MICRO SET SILHO 5FR 7 (SHEATH) IMPLANT

## 2023-07-28 NOTE — Telephone Encounter (Signed)
 Refill request for warfarin:  Last INR was 2.1 on 07/07/23 Next INR due 08/18/23 LOV was 12/20/22  Refill approved.

## 2023-07-28 NOTE — Progress Notes (Signed)
 Transported off unit to Cath Lab with Cath Lab staff members via bed.

## 2023-07-28 NOTE — Plan of Care (Signed)
  Problem: Clinical Measurements: Goal: Ability to maintain clinical measurements within normal limits will improve Outcome: Progressing Goal: Will remain free from infection Outcome: Progressing Goal: Diagnostic test results will improve Outcome: Progressing Goal: Respiratory complications will improve Outcome: Progressing Goal: Cardiovascular complication will be avoided Outcome: Progressing   Problem: Elimination: Goal: Will not experience complications related to bowel motility Outcome: Progressing Goal: Will not experience complications related to urinary retention Outcome: Progressing   Problem: Pain Managment: Goal: General experience of comfort will improve and/or be controlled Outcome: Progressing   Problem: Skin Integrity: Goal: Risk for impaired skin integrity will decrease Outcome: Progressing   Problem: Safety: Goal: Ability to remain free from injury will improve Outcome: Progressing   Problem: Elimination: Goal: Will not experience complications related to bowel motility Outcome: Progressing Goal: Will not experience complications related to urinary retention Outcome: Progressing

## 2023-07-28 NOTE — Progress Notes (Signed)
 PHARMACY - ANTICOAGULATION CONSULT NOTE  Pharmacy Consult for PTA warfarin >> heparin Indication: atrial fibrillation  Allergies  Allergen Reactions   Mirapex [Pramipexole Dihydrochloride] Itching and Swelling   Penicillins Rash   Atorvastatin Other (See Comments)   Fosamax [Alendronate] Other (See Comments)   Gabapentin Other (See Comments)    Couldn't move legs during the night after taking.   Levothyroxine Itching   Symbicort [Budesonide-Formoterol Fumarate] Itching    Patient Measurements: Height: 5\' 5"  (165.1 cm) Weight: 79.9 kg (176 lb 1.6 oz) IBW/kg (Calculated) : 57  Vital Signs: Temp: 97.3 F (36.3 C) (02/20 1207) Temp Source: Oral (02/20 1207) BP: 165/62 (02/20 1207) Pulse Rate: 75 (02/20 1207)  Labs: Recent Labs    07/26/23 0326 07/26/23 0326 07/27/23 0012 07/27/23 0849 07/27/23 0900 07/27/23 1441 07/28/23 0730 07/28/23 0854  HGB 9.6*  --  10.5*  --   --   --   --  10.9*  HCT 32.4*  --  35.3*  --   --   --   --  36.5  PLT 388  --  419*  --   --   --   --  404*  LABPROT 19.6*  --  19.3*  --   --   --   --  17.4*  INR 1.6*  --  1.6*  --   --   --   --  1.4*  HEPARINUNFRC  --    < > <0.10* 0.59  --  0.83* 0.32  --   CREATININE  --   --   --   --  0.81  --   --  0.75   < > = values in this interval not displayed.    Estimated Creatinine Clearance: 61.5 mL/min (by C-G formula based on SCr of 0.75 mg/dL).   Medical History: Past Medical History:  Diagnosis Date   Cancer (HCC)    breast    Coronary artery disease due to lipid rich plaque 04/13/2016   Diabetes mellitus without complication (HCC)    Diverticulosis of intestine without bleeding 12/13/2016   History of right mastectomy 11/15/2014   Hypothyroidism    Overactive bladder    Paget's disease of the bone    Paroxysmal atrial fibrillation (HCC) 06/19/2014   Restless leg syndrome     Medications:  Warfarin 5mg  PO every Sunday, Tuesday, Thursday and take 2.5 mg PO all other  days  Assessment: 78 yo F presented to ED with difficulty walking/fall and concerns for TIA.  Pharmacy consulted to dose warfarin but now holding due to plan for RHC/LHC scheduled for 2/20). Patient started on heparin  Heparin level is therapeutic on 1250 units/hr.  Goal of Therapy:  INR 2-3 Heparin level 0.3-0.5 Monitor platelets by anticoagulation protocol: Yes   Plan:  Continue heparin infusion at 1250 units/hr Noted plans for cath today. Will follow-up after cath for plans for resuming anticoagulation.  Daily heparin level, CBC, and INR.  [employer], Pharm.D., BCPS Clinical Pharmacist Clinical phone for 07/28/2023 from 7:30-3:00 is [phone].  **Pharmacist phone directory can be found on [email or web address] listed under [facility].  07/28/2023 12:38 PM

## 2023-07-28 NOTE — Progress Notes (Signed)
 Patient Off Unit at Cath Lab at this time. Unable to complete NIHSS q4

## 2023-07-28 NOTE — H&P (View-Only) (Signed)
 Rounding Note    Patient Name: Desiree Bowers Date of Encounter: 07/28/2023  [facility] Cardiologist: Dina Rich, MD   Subjective   Pt with progressive dyspnea, Hx of PAF, HTN MR   She is ok for cath     Inpatient Medications    Scheduled Meds:  aspirin EC  81 mg Oral Daily   insulin aspart  0-5 Units Subcutaneous QHS   insulin aspart  0-9 Units Subcutaneous TID WC   levothyroxine  112 mcg Oral QAC breakfast   metoprolol succinate  100 mg Oral Daily   pantoprazole  40 mg Oral Daily   Continuous Infusions:  heparin 1,250 Units/hr (07/28/23 0025)   PRN Meds: acetaminophen **OR** acetaminophen (TYLENOL) oral liquid 160 mg/5 mL **OR** acetaminophen, hydrOXYzine, labetalol, senna-docusate   Vital Signs    Vitals:   07/27/23 2226 07/28/23 0404 07/28/23 0515 07/28/23 0827  BP: (!) 155/86 (!) 149/91  (!) 172/68  Pulse: 72 74  82  Resp: 18 17  18   Temp: (!) 97.5 F (36.4 C) 97.9 F (36.6 C)  98.4 F (36.9 C)  TempSrc: Oral   Oral  SpO2: 94% 96%  100%  Weight:   80.5 kg   Height:        Intake/Output Summary (Last 24 hours) at 07/28/2023 1022 Last data filed at 07/28/2023 0025 Gross per 24 hour  Intake 615.37 ml  Output --  Net 615.37 ml      07/28/2023    5:15 AM 07/27/2023    5:07 PM 07/24/2023    9:43 PM  Last 3 Weights  Weight (lbs) 177 lb 7.5 oz 177 lb 7.5 oz 181 lb 3.5 oz  Weight (kg) 80.5 kg 80.5 kg 82.2 kg      Telemetry    NSR  - Personally Reviewed  ECG     - Personally Reviewed  Physical Exam   GEN: No acute distress.   Neck: No JVD Cardiac: RRR, 2/6 systolic murmur radiating to the left axillary line   Respiratory: Clear to auscultation bilaterally. GI: Soft, nontender, non-distended  MS: No edema; No deformity. Neuro:  Nonfocal  Psych: Normal affect   Labs    High Sensitivity Troponin:   Recent Labs  Lab 07/24/23 1222 07/24/23 1357  TROPONINIHS 18* 18*     Chemistry Recent Labs  Lab 07/24/23 1126  07/27/23 0845 07/27/23 0900 07/28/23 0854  NA 137  --  137 140  K 3.8  --  3.7 4.1  CL 102  --  100 104  CO2 27  --  24 26  GLUCOSE 155*  --  107* 88  BUN 16  --  17 17  CREATININE 0.71  --  0.81 0.75  CALCIUM 9.0  --  9.1 9.6  MG  --  1.8  --   --   GFRNONAA >60  --  >60 >60  ANIONGAP 8  --  13 10    Lipids  Recent Labs  Lab 07/25/23 0437  CHOL 136  TRIG 106  HDL 31*  LDLCALC 84  CHOLHDL 4.4    Hematology Recent Labs  Lab 07/26/23 0326 07/27/23 0012 07/28/23 0854  WBC 6.1 6.3 5.7  RBC 3.97 4.37 4.53  HGB 9.6* 10.5* 10.9*  HCT 32.4* 35.3* 36.5  MCV 81.6 80.8 80.6  MCH 24.2* 24.0* 24.1*  MCHC 29.6* 29.7* 29.9*  RDW 21.9* 21.6* 21.8*  PLT 388 419* 404*   Thyroid  Recent Labs  Lab 07/25/23 0437  TSH 2.899    BNP Recent Labs  Lab 07/24/23 1223 07/27/23 0012  BNP 770.0* 886.0*    DDimer  Recent Labs  Lab 07/24/23 1357  DDIMER <0.27     Radiology    No results found.  Cardiac Studies     Patient Profile     78 y.o. female   Assessment & Plan     Progressive dysnea.   Acute on chronic combined CHF ;    She was sent to [facility] for R and L heart cath .  We have discussed the risks, benefits, options of cardiac cath .  She understands and agrees to proceed.   Has had right mastectomy. Would advise avoiding right arm cath  Left radial pulse is good Left femoral artery is good   2  MR :  moderate MR by echo   3.  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  PUlmonary HTN:  has moderate pulmonary HTN - PA pressures estimated at 47.7 mmHg. For R and L heart cath today      For questions or updates, please contact [facility] Please consult [email or web address] for contact info under        Signed, Kristeen Miss, MD  07/28/2023, 10:22 AM

## 2023-07-28 NOTE — Plan of Care (Signed)
  Problem: Health Behavior/Discharge Planning: Goal: Ability to manage health-related needs will improve Outcome: Progressing   Problem: Clinical Measurements: Goal: Ability to maintain clinical measurements within normal limits will improve Outcome: Progressing Goal: Will remain free from infection Outcome: Progressing   Problem: Education: Goal: Ability to describe self-care measures that may prevent or decrease complications (Diabetes Survival Skills Education) will improve Outcome: Progressing Goal: Individualized Educational Video(s) Outcome: Progressing   Problem: Coping: Goal: Ability to adjust to condition or change in health will improve Outcome: Progressing   Problem: Fluid Volume: Goal: Ability to maintain a balanced intake and output will improve Outcome: Progressing   Problem: Education: Goal: Knowledge of disease or condition will improve Outcome: Progressing Goal: Knowledge of secondary prevention will improve (MUST DOCUMENT ALL) Outcome: Progressing Goal: Knowledge of patient specific risk factors will improve (DELETE if not current risk factor) Outcome: Progressing   Problem: Ischemic Stroke/TIA Tissue Perfusion: Goal: Complications of ischemic stroke/TIA will be minimized Outcome: Progressing   Problem: Coping: Goal: Will verbalize positive feelings about self Outcome: Progressing

## 2023-07-28 NOTE — Interval H&P Note (Signed)
 History and Physical Interval Note:  07/28/2023 3:15 PM  Desiree Bowers  has presented today for surgery, with the diagnosis of MR, DOE.  The various methods of treatment have been discussed with the patient and family. After consideration of risks, benefits and other options for treatment, the patient has consented to  Procedure(s): RIGHT/LEFT HEART CATH AND CORONARY ANGIOGRAPHY (N/A) as a surgical intervention.  The patient's history has been reviewed, patient examined, no change in status, stable for surgery.  I have reviewed the patient's chart and labs.  Questions were answered to the patient's satisfaction.     Tonny Bollman

## 2023-07-28 NOTE — Progress Notes (Signed)
 Rounding Note    Patient Name: Desiree Bowers Date of Encounter: 07/28/2023  [facility] Cardiologist: Dina Rich, MD   Subjective   Pt with progressive dyspnea, Hx of PAF, HTN MR   She is ok for cath     Inpatient Medications    Scheduled Meds:  aspirin EC  81 mg Oral Daily   insulin aspart  0-5 Units Subcutaneous QHS   insulin aspart  0-9 Units Subcutaneous TID WC   levothyroxine  112 mcg Oral QAC breakfast   metoprolol succinate  100 mg Oral Daily   pantoprazole  40 mg Oral Daily   Continuous Infusions:  heparin 1,250 Units/hr (07/28/23 0025)   PRN Meds: acetaminophen **OR** acetaminophen (TYLENOL) oral liquid 160 mg/5 mL **OR** acetaminophen, hydrOXYzine, labetalol, senna-docusate   Vital Signs    Vitals:   07/27/23 2226 07/28/23 0404 07/28/23 0515 07/28/23 0827  BP: (!) 155/86 (!) 149/91  (!) 172/68  Pulse: 72 74  82  Resp: 18 17  18   Temp: (!) 97.5 F (36.4 C) 97.9 F (36.6 C)  98.4 F (36.9 C)  TempSrc: Oral   Oral  SpO2: 94% 96%  100%  Weight:   80.5 kg   Height:        Intake/Output Summary (Last 24 hours) at 07/28/2023 1022 Last data filed at 07/28/2023 0025 Gross per 24 hour  Intake 615.37 ml  Output --  Net 615.37 ml      07/28/2023    5:15 AM 07/27/2023    5:07 PM 07/24/2023    9:43 PM  Last 3 Weights  Weight (lbs) 177 lb 7.5 oz 177 lb 7.5 oz 181 lb 3.5 oz  Weight (kg) 80.5 kg 80.5 kg 82.2 kg      Telemetry    NSR  - Personally Reviewed  ECG     - Personally Reviewed  Physical Exam   GEN: No acute distress.   Neck: No JVD Cardiac: RRR, 2/6 systolic murmur radiating to the left axillary line   Respiratory: Clear to auscultation bilaterally. GI: Soft, nontender, non-distended  MS: No edema; No deformity. Neuro:  Nonfocal  Psych: Normal affect   Labs    High Sensitivity Troponin:   Recent Labs  Lab 07/24/23 1222 07/24/23 1357  TROPONINIHS 18* 18*     Chemistry Recent Labs  Lab 07/24/23 1126  07/27/23 0845 07/27/23 0900 07/28/23 0854  NA 137  --  137 140  K 3.8  --  3.7 4.1  CL 102  --  100 104  CO2 27  --  24 26  GLUCOSE 155*  --  107* 88  BUN 16  --  17 17  CREATININE 0.71  --  0.81 0.75  CALCIUM 9.0  --  9.1 9.6  MG  --  1.8  --   --   GFRNONAA >60  --  >60 >60  ANIONGAP 8  --  13 10    Lipids  Recent Labs  Lab 07/25/23 0437  CHOL 136  TRIG 106  HDL 31*  LDLCALC 84  CHOLHDL 4.4    Hematology Recent Labs  Lab 07/26/23 0326 07/27/23 0012 07/28/23 0854  WBC 6.1 6.3 5.7  RBC 3.97 4.37 4.53  HGB 9.6* 10.5* 10.9*  HCT 32.4* 35.3* 36.5  MCV 81.6 80.8 80.6  MCH 24.2* 24.0* 24.1*  MCHC 29.6* 29.7* 29.9*  RDW 21.9* 21.6* 21.8*  PLT 388 419* 404*   Thyroid  Recent Labs  Lab 07/25/23 0437  TSH 2.899    BNP Recent Labs  Lab 07/24/23 1223 07/27/23 0012  BNP 770.0* 886.0*    DDimer  Recent Labs  Lab 07/24/23 1357  DDIMER <0.27     Radiology    No results found.  Cardiac Studies     Patient Profile     78 y.o. female   Assessment & Plan     Progressive dysnea.   Acute on chronic combined CHF ;    She was sent to [facility] for R and L heart cath .  We have discussed the risks, benefits, options of cardiac cath .  She understands and agrees to proceed.   Has had right mastectomy. Would advise avoiding right arm cath  Left radial pulse is good Left femoral artery is good   2  MR :  moderate MR by echo   3.  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  PUlmonary HTN:  has moderate pulmonary HTN - PA pressures estimated at 47.7 mmHg. For R and L heart cath today      For questions or updates, please contact [facility] Please consult [email or web address] for contact info under        Signed, Kristeen Miss, MD  07/28/2023, 10:22 AM

## 2023-07-28 NOTE — Progress Notes (Signed)
  Progress Note   Patient: Desiree Bowers AOZ:[id number] DOB: January 03, 1946 DOA: 07/24/2023     2 DOS: the patient was seen and examined on 07/28/2023   Brief hospital course: Desiree Bowers is a 78 y.o. female with medical history significant for diabetes mellitus, hypertension, paroxysmal atrial fibrillation.  Patient presented to the ED with complaints of difficulty walking.  She reports she woke up this morning at about 7:30 AM, and was leaning to the right while ambulating, and she could not walk a straight line.  She reports she fell onto her left, she did not hit her head..  She is unable to tell me how long this lasted. She ambulated here in the ED , and this has improved.No change in vision.  No change in speech.  No reports of facial asymmetry.She also reports about 6 weeks ago, she bent over and suddenly became short of breath.  Reports since then has breathing has not been back to normal.  Has a chronic unchanged cough.  No chest pain.  No lower extremity swelling.  Pt was transferred to [facility] for cardiology eval with cardiac cath due to progressive dyspnea.  Assessment and Plan: * TIA (transient ischemic attack) - ASA 81 mg PO daily   Combined HFpEF - Cardiology will perform cardiac cath 07/28/2023  PAF (paroxysmal atrial fibrillation) (HCC) - IV heparin drip  - Toprol XL 100 mg PO daily   Acquired hypothyroidism - Synthroid 112 mcg PO daily   DM type 2 with diabetic dyslipidemia (HCC) - Novolog SS ACHS   Essential (primary) hypertension - Toprol XL as above   Subjective: Pt seen and examined at the bedside. No acute complaints and she is NPO for her cardiac cath today.  Physical Exam: Vitals:   07/27/23 2226 07/28/23 0404 07/28/23 0515 07/28/23 0827  BP: (!) 155/86 (!) 149/91  (!) 172/68  Pulse: 72 74  82  Resp: 18 17  18   Temp: (!) 97.5 F (36.4 C) 97.9 F (36.6 C)  98.4 F (36.9 C)  TempSrc: Oral   Oral  SpO2: 94% 96%  100%  Weight:   80.5 kg   Height:        Physical Exam HENT:     Head: Normocephalic.     Mouth/Throat:     Mouth: Mucous membranes are moist.  Cardiovascular:     Rate and Rhythm: Normal rate.  Pulmonary:     Effort: Pulmonary effort is normal.  Abdominal:     Palpations: Abdomen is soft.  Musculoskeletal:        General: Normal range of motion.     Cervical back: Neck supple.  Skin:    General: Skin is warm.  Neurological:     Mental Status: She is alert. Mental status is at baseline.  Psychiatric:        Mood and Affect: Mood normal.     Disposition: Status is: Inpatient Remains inpatient appropriate because: Planned for cardiac cath   Planned Discharge Destination:  Dispo per pt's clinical progress     Time spent: 35 minutes  Author: Baron Hamper , MD 07/28/2023 10:42 AM  For on call review [email or web address].

## 2023-07-28 NOTE — Progress Notes (Signed)
 EOB, tolerated well, right groin remains wnl, see flowsheets for VS, BP decreased when pt stood at bedside, rn x 2 present, pt states her head feels funny, BP at 97/61, safety maintained, back to bed, right groin remains wnl

## 2023-07-28 NOTE — Progress Notes (Signed)
 Cath lab called that Cath Lab staff were coming to pick up the patient.  Heparin gtt stopped at this time.

## 2023-07-28 NOTE — Progress Notes (Signed)
 Arrived back from Cath Lab.  Right groin site is clean, dry, and intact.

## 2023-07-28 NOTE — Progress Notes (Signed)
 PHARMACY - ANTICOAGULATION CONSULT NOTE  Pharmacy Consult for PTA warfarin >> heparin Indication: atrial fibrillation  Allergies  Allergen Reactions   Mirapex [Pramipexole Dihydrochloride] Itching and Swelling   Penicillins Rash   Atorvastatin Other (See Comments)   Fosamax [Alendronate] Other (See Comments)   Gabapentin Other (See Comments)    Couldn't move legs during the night after taking.   Levothyroxine Itching   Symbicort [Budesonide-Formoterol Fumarate] Itching    Patient Measurements: Height: 5\' 5"  (165.1 cm) Weight: 79.9 kg (176 lb 1.6 oz) IBW/kg (Calculated) : 57  Vital Signs: Temp: 97.3 F (36.3 C) (02/20 1207) Temp Source: Oral (02/20 1207) BP: 172/98 (02/20 1602) Pulse Rate: 88 (02/20 1602)  Labs: Recent Labs    07/26/23 0326 07/26/23 0326 07/27/23 0012 07/27/23 0849 07/27/23 0900 07/27/23 1441 07/28/23 0730 07/28/23 0854 07/28/23 1549 07/28/23 1550  HGB 9.6*  --  10.5*  --   --   --   --  10.9* 11.9* 12.6  HCT 32.4*  --  35.3*  --   --   --   --  36.5 35.0* 37.0  PLT 388  --  419*  --   --   --   --  404*  --   --   LABPROT 19.6*  --  19.3*  --   --   --   --  17.4*  --   --   INR 1.6*  --  1.6*  --   --   --   --  1.4*  --   --   HEPARINUNFRC  --    < > <0.10* 0.59  --  0.83* 0.32  --   --   --   CREATININE  --   --   --   --  0.81  --   --  0.75  --   --    < > = values in this interval not displayed.    Estimated Creatinine Clearance: 61.5 mL/min (by C-G formula based on SCr of 0.75 mg/dL).   Medical History: Past Medical History:  Diagnosis Date   Cancer (HCC)    breast    Coronary artery disease due to lipid rich plaque 04/13/2016   Diabetes mellitus without complication (HCC)    Diverticulosis of intestine without bleeding 12/13/2016   History of right mastectomy 11/15/2014   Hypothyroidism    Overactive bladder    Paget's disease of the bone    Paroxysmal atrial fibrillation (HCC) 06/19/2014   Restless leg syndrome      Medications:  Warfarin 5mg  PO every Sunday, Tuesday, Thursday and take 2.5 mg PO all other days  Assessment: 78 yo F presented to ED with difficulty walking/fall and concerns for TIA.  Pharmacy consulted to dose warfarin but now holding due to plan for RHC/LHC scheduled for 2/20). Patient started on heparin  Pt is s/p cath with normal coronaries. Ok to resume heparin 8 hr post sheath removal (4pm) and resume coumadin.   INR 1.4 PTA: 2.5mg  PO qday except 5mg  PO TThSun  Goal of Therapy:  INR 2-3 Heparin level 0.3-0.5 Monitor platelets by anticoagulation protocol: Yes   Plan:  Resume heparin infusion at 1250 units/hr - at midnight Check 8 hr HL Coumadin 5mg  PO x1 Daily heparin level, CBC, and INR.  Ulyses Southward, PharmD, BCIDP, AAHIVP, CPP Infectious Disease Pharmacist 07/28/2023 4:30 PM

## 2023-07-29 ENCOUNTER — Other Ambulatory Visit (HOSPITAL_COMMUNITY): Payer: Self-pay

## 2023-07-29 ENCOUNTER — Encounter (HOSPITAL_COMMUNITY): Payer: Self-pay | Admitting: Cardiovascular Disease

## 2023-07-29 ENCOUNTER — Telehealth (HOSPITAL_COMMUNITY): Payer: Self-pay | Admitting: Pharmacy Technician

## 2023-07-29 DIAGNOSIS — I480 Paroxysmal atrial fibrillation: Secondary | ICD-10-CM | POA: Diagnosis not present

## 2023-07-29 DIAGNOSIS — I5020 Unspecified systolic (congestive) heart failure: Secondary | ICD-10-CM | POA: Diagnosis not present

## 2023-07-29 DIAGNOSIS — G459 Transient cerebral ischemic attack, unspecified: Secondary | ICD-10-CM | POA: Diagnosis not present

## 2023-07-29 LAB — BASIC METABOLIC PANEL
Anion gap: 12 (ref 5–15)
BUN: 20 mg/dL (ref 8–23)
CO2: 23 mmol/L (ref 22–32)
Calcium: 9.5 mg/dL (ref 8.9–10.3)
Chloride: 102 mmol/L (ref 98–111)
Creatinine, Ser: 0.89 mg/dL (ref 0.44–1.00)
GFR, Estimated: >60 mL/min (ref >60)
Glucose, Bld: 101 mg/dL — ABNORMAL HIGH (ref 70–99)
Potassium: 4 mmol/L (ref 3.5–5.1)
Sodium: 137 mmol/L (ref 135–145)

## 2023-07-29 LAB — GLUCOSE, CAPILLARY
Glucose-Capillary: 132 mg/dL — ABNORMAL HIGH (ref 70–99)
Glucose-Capillary: 125 mg/dL — ABNORMAL HIGH (ref 70–99)

## 2023-07-29 LAB — CBC
HCT: 37.3 % (ref 36.0–46.0)
Hemoglobin: 11.3 g/dL — ABNORMAL LOW (ref 12.0–15.0)
MCH: 24 pg — ABNORMAL LOW (ref 26.0–34.0)
MCHC: 30.3 g/dL (ref 30.0–36.0)
MCV: 79.2 fL — ABNORMAL LOW (ref 80.0–100.0)
Platelets: 398 10*3/uL (ref 150–400)
RBC: 4.71 MIL/uL (ref 3.87–5.11)
RDW: 21.9 % — ABNORMAL HIGH (ref 11.5–15.5)
WBC: 5.4 10*3/uL (ref 4.0–10.5)
nRBC: 0 % (ref 0.0–0.2)

## 2023-07-29 LAB — PROTIME-INR
INR: 1.3 — ABNORMAL HIGH (ref 0.8–1.2)
Prothrombin Time: 16.4 s — ABNORMAL HIGH (ref 11.4–15.2)

## 2023-07-29 LAB — HEPARIN LEVEL (UNFRACTIONATED): Heparin Unfractionated: 0.37 [IU]/mL (ref 0.30–0.70)

## 2023-07-29 MED ORDER — ENOXAPARIN SODIUM 80 MG/0.8ML IJ SOSY
80.0000 mg | PREFILLED_SYRINGE | Freq: Once | INTRAMUSCULAR | Status: AC
  Administered 2023-07-29: 80 mg via SUBCUTANEOUS
  Filled 2023-07-29: qty 0.8

## 2023-07-29 MED ORDER — ENOXAPARIN SODIUM 80 MG/0.8ML IJ SOSY: 1.0000 mg/kg | PREFILLED_SYRINGE | Freq: Two times a day (BID) | INTRAMUSCULAR | 0 refills | Status: DC

## 2023-07-29 MED ORDER — FUROSEMIDE 20 MG PO TABS
20.0000 mg | ORAL_TABLET | Freq: Every day | ORAL | Status: DC
Start: 2023-07-29 — End: 2023-07-29
  Administered 2023-07-29: 20 mg via ORAL
  Filled 2023-07-29: qty 1

## 2023-07-29 MED ORDER — ASPIRIN 81 MG PO TBEC: 81.0000 mg | DELAYED_RELEASE_TABLET | Freq: Every day | ORAL | 0 refills | Status: DC

## 2023-07-29 MED ORDER — WARFARIN SODIUM 5 MG PO TABS: 5.0000 mg | ORAL_TABLET | Freq: Once | ORAL | 0 refills | Status: DC

## 2023-07-29 MED ORDER — WARFARIN SODIUM 5 MG PO TABS
5.0000 mg | ORAL_TABLET | Freq: Once | ORAL | Status: AC
  Administered 2023-07-29: 5 mg via ORAL
  Filled 2023-07-29: qty 1

## 2023-07-29 MED ORDER — FUROSEMIDE 20 MG PO TABS: 20.0000 mg | ORAL_TABLET | Freq: Every day | ORAL | 0 refills | Status: AC

## 2023-07-29 MED ORDER — MECLIZINE HCL 25 MG PO TABS: 25.0000 mg | ORAL_TABLET | Freq: Two times a day (BID) | ORAL | 0 refills | Status: AC | PRN

## 2023-07-29 MED ORDER — MECLIZINE HCL 25 MG PO TABS
25.0000 mg | ORAL_TABLET | Freq: Two times a day (BID) | ORAL | Status: DC | PRN
  Administered 2023-07-29: 25 mg via ORAL
  Filled 2023-07-29 (×2): qty 1

## 2023-07-29 NOTE — Progress Notes (Signed)
 PHARMACY - ANTICOAGULATION CONSULT NOTE  Pharmacy Consult for heparin >> warfarin Indication: atrial fibrillation  Allergies  Allergen Reactions   Mirapex [Pramipexole Dihydrochloride] Itching and Swelling   Penicillins Rash   Atorvastatin Other (See Comments)   Fosamax [Alendronate] Other (See Comments)   Gabapentin Other (See Comments)    Couldn't move legs during the night after taking.   Levothyroxine Itching   Symbicort [Budesonide-Formoterol Fumarate] Itching    Patient Measurements: Height: 5\' 5"  (165.1 cm) Weight: 79.9 kg (176 lb 1.6 oz) IBW/kg (Calculated) : 57  Vital Signs: Temp: 98.4 F (36.9 C) (02/21 1144) Temp Source: Oral (02/21 1144) BP: 154/61 (02/21 1144) Pulse Rate: 78 (02/21 1144)  Labs: Recent Labs    07/27/23 0012 07/27/23 0849 07/27/23 0900 07/27/23 1441 07/28/23 0730 07/28/23 0854 07/28/23 1549 07/28/23 1550 07/29/23 0609 07/29/23 0921  HGB 10.5*  --   --   --   --  10.9* 11.9* 12.6 11.3*  --   HCT 35.3*  --   --   --   --  36.5 35.0* 37.0 37.3  --   PLT 419*  --   --   --   --  404*  --   --  398  --   LABPROT 19.3*  --   --   --   --  17.4*  --   --  16.4*  --   INR 1.6*  --   --   --   --  1.4*  --   --  1.3*  --   HEPARINUNFRC <0.10*   < >  --  0.83* 0.32  --   --   --   --  0.37  CREATININE  --   --  0.81  --   --  0.75  --   --  0.89  --    < > = values in this interval not displayed.    Estimated Creatinine Clearance: 55.3 mL/min (by C-G formula based on SCr of 0.89 mg/dL).   Medical History: Past Medical History:  Diagnosis Date   Cancer (HCC)    breast    Coronary artery disease due to lipid rich plaque 04/13/2016   Diabetes mellitus without complication (HCC)    Diverticulosis of intestine without bleeding 12/13/2016   History of right mastectomy 11/15/2014   Hypothyroidism    Overactive bladder    Paget's disease of the bone    Paroxysmal atrial fibrillation (HCC) 06/19/2014   Restless leg syndrome     Medications:   Warfarin 5mg  PO every Sunday, Tuesday, Thursday and take 2.5 mg PO all other days  Assessment: 78 yo F presented to ED with difficulty walking/fall and concerns for TIA.  Warfarin was held and patient was bridged with heparin for cardiac cath.  Pt is s/p cath with normal coronaries.   Heparin restarted at 1250 units/hr with therapeutic heparin level. INR subtherapeutic at 1.3   Goal of Therapy:  INR 2-3 Heparin level 0.3-0.5 Monitor platelets by anticoagulation protocol: Yes   Plan:  Continue heparin infusion at 1250 units/hr  Coumadin 5mg  PO x1 Daily heparin level, CBC, and INR.   If plans for discharge on Lovenox > Warfarin bridge would recommend: Lovenox 80mg  SQ q12h - copay for #10 syringes is $62 Warfari5mg  daily until follow-up INR check on Mon/Tues (2/24-2/25)   Dixie Dials, Pharm.D., BCPS Clinical Pharmacist Clinical phone for 07/29/2023 from 7:30-3:00 is [phone].  **Pharmacist phone directory can be found on [email or web address] listed under [facility]  Pharmacy.  07/29/2023 12:01 PM

## 2023-07-29 NOTE — Plan of Care (Signed)
  Problem: Health Behavior/Discharge Planning: Goal: Ability to manage health-related needs will improve Outcome: Progressing   Problem: Clinical Measurements: Goal: Ability to maintain clinical measurements within normal limits will improve Outcome: Progressing Goal: Will remain free from infection Outcome: Progressing   Problem: Nutrition: Goal: Adequate nutrition will be maintained Outcome: Progressing   Problem: Elimination: Goal: Will not experience complications related to urinary retention Outcome: Progressing   Problem: Skin Integrity: Goal: Risk for impaired skin integrity will decrease Outcome: Progressing   Problem: Tissue Perfusion: Goal: Adequacy of tissue perfusion will improve Outcome: Progressing   Problem: Nutrition: Goal: Risk of aspiration will decrease Outcome: Progressing

## 2023-07-29 NOTE — Evaluation (Signed)
 Physical Therapy Brief Evaluation and Discharge Note Patient Details Name: Desiree Bowers MRN: [id number] DOB: 1945-08-09 Today's Date: 07/29/2023   History of Present Illness  78 y.o. F admitted on 07/23/22 due to difficulty ambulating with R sided lean. CT and MRI negative, work up for TIA. PMH significant for DM2, HTN, A-fib.  Clinical Impression  Pt is presenting at baseline level of functioning. Pt reports that prior to hospitalization she was using her furniture and walls intermittently for balance. Pt denies falls. Supportive family. No significant balance deviations noted and pt had no signs/symptoms of cardiac/respiratory distress with activity. Currently pt is presenting at baseline level of functioning and no skilled physical therapy services recommended. Pt will be discharged from skilled physical therapy services at this time; please re-consult if further needs arise.           PT Assessment Patient does not need any further PT services  Assistance Needed at Discharge  PRN    Equipment Recommendations None recommended by PT     Precautions/Restrictions Precautions Precautions: None Restrictions Weight Bearing Restrictions Per Provider Order: No        Mobility  Bed Mobility   Supine/Sidelying to sit: Independent Sit to supine/sidelying: Independent    Transfers Overall transfer level: Independent Equipment used: None       General transfer comment: Indep    Ambulation/Gait Ambulation/Gait assistance: Independent Gait Distance (Feet): 200 Feet Assistive device: None Gait Pattern/deviations: Step-through pattern, WFL(Within Functional Limits) Gait Speed: Pace WFL General Gait Details: intermittent hand holds. Pt states that at home she uses furniture/walls to ambulate occasionally due to balance.  Home Activity Instructions Home Activity Instructions: continue taking stairs carefully one step at a time. Discussed possibly going to OPPT for  balance.  Stairs Stairs: Yes Stairs assistance: Modified independent (Device/Increase time) Stair Management: One rail Right, Forwards, Step to pattern Number of Stairs: 2 General stair comments: stairs per home set up. Pt has a storm door she holds onto the R.     Balance Overall balance assessment: Mild deficits observed, not formally tested Sitting-balance support: No upper extremity supported Sitting balance-Leahy Scale: Normal     Standing balance support: During functional activity, No upper extremity supported Standing balance-Leahy Scale: Fair Standing balance comment: no overt LOB. Occasional hand hold on walls/rails for balance.          Pertinent Vitals/Pain   Pain Assessment Pain Assessment: No/denies pain     Home Living Family/patient expects to be discharged to:: Private residence Living Arrangements: Alone Available Help at Discharge: Family;Available PRN/intermittently Home Environment: Stairs to enter  Stairs-Number of Steps: 2 Home Equipment: Shower seat   Additional Comments: Daughter is coming to stay the night.    Prior Function Level of Independence: Independent      UE/LE Assessment   UE ROM/Strength/Tone/Coordination: WFL    LE ROM/Strength/Tone/Coordination: [facility]      Communication   Communication Communication: No apparent difficulties     Cognition Overall Cognitive Status: Appears within functional limits for tasks assessed/performed       General Comments General comments (skin integrity, edema, etc.): No signs/symptoms of cardiac/respiratory distress with activity.        Assessment/Plan           No Skilled PT Patient at baseline level of functioning    AMPAC 6 Clicks Help needed turning from your back to your side while in a flat bed without using bedrails?: None Help needed moving from lying on your back to  sitting on the side of a flat bed without using bedrails?: None Help needed moving to and from a bed  to a chair (including a wheelchair)?: None Help needed standing up from a chair using your arms (e.g., wheelchair or bedside chair)?: None Help needed to walk in hospital room?: None Help needed climbing 3-5 steps with a railing? : None 6 Click Score: 24      End of Session   Activity Tolerance: Patient tolerated treatment well Patient left: in bed;with call bell/phone within reach;with family/visitor present Nurse Communication: Mobility status       Time: 1554-1610 PT Time Calculation (min) (ACUTE ONLY): 16 min  Charges:   PT Evaluation $PT Eval Low Complexity: 1 Low     Harrel Carina, DPT, CLT  Acute Rehabilitation Services Office: [phone] (Secure chat preferred)   Claudia Desanctis  07/29/2023, 4:23 PM

## 2023-07-29 NOTE — Discharge Instructions (Signed)
 Post Cardiac Catheterization: NO HEAVY LIFTING OR SEXUAL ACTIVITY X 7 DAYS. NO DRIVING X 2-3 DAYS. NO SOAKING BATHS, HOT TUBS, POOLS, ETC., X 7 DAYS.  Groin Site Care: Refer to this sheet in the next few weeks. These instructions provide you with information on caring for yourself after your procedure. Your caregiver may also give you more specific instructions. Your treatment has been planned according to current medical practices, but problems sometimes occur. Call your caregiver if you have any problems or questions after your procedure. HOME CARE INSTRUCTIONS You may shower 24 hours after the procedure. Remove the bandage (dressing) and gently wash the site with plain soap and water. Gently pat the site dry.  Do not apply powder or lotion to the site.  Do not sit in a bathtub, swimming pool, or whirlpool for 5 to 7 days.  No bending, squatting, or lifting anything over 10 pounds (4.5 kg) as directed by your caregiver.  Inspect the site at least twice daily.  Do not drive home if you are discharged the same day of the procedure. Have someone else drive you.  What to expect: Any bruising will usually fade within 1 to 2 weeks.  Blood that collects in the tissue (hematoma) may be painful to the touch. It should usually decrease in size and tenderness within 1 to 2 weeks.  SEEK IMMEDIATE MEDICAL CARE IF: You have unusual pain at the groin site or down the affected leg.  You have redness, warmth, swelling, or pain at the groin site.  You have drainage (other than a small amount of blood on the dressing).  You have chills.  You have a fever or persistent symptoms for more than 72 hours.  You have a fever and your symptoms suddenly get worse.  Your leg becomes pale, cool, tingly, or numb.  You have heavy bleeding from the site. Hold pressure on the site.   _______________  Heart Failure Education: Weigh yourself EVERY morning after you go to the bathroom but before you eat or drink  anything. Write this number down in a weight log/diary. If you gain 3 pounds overnight or 5 pounds in a week, call the office. Take your medicines as prescribed. If you have concerns about your medications, please call us before you stop taking them.  Eat low salt foods--Limit salt (sodium) to 2000 mg per day. This will help prevent your body from holding onto fluid. Read food labels as many processed foods have a lot of sodium, especially canned goods and prepackaged meats. If you would like some assistance choosing low sodium foods, we would be happy to set you up with a nutritionist. Limit all fluids for the day to less than 2 liters (64 ounces). Fluid includes all drinks, coffee, juice, ice chips, soup, jello, and all other liquids. Stay as active as you can everyday. Staying active will give you more energy and make your muscles stronger. Start with 5 minutes at a time and work your way up to 30 minutes a day. Break up your activities--do some in the morning and some in the afternoon. Start with 3 days per week and work your way up to 5 days as you can.  If you have chest pain, feel short of breath, dizzy, or lightheaded, STOP. If you don't feel better after a short rest, call 911. If you do feel better, call the office to let us know you have symptoms with exercise.

## 2023-07-29 NOTE — Progress Notes (Addendum)
 Rounding Note    Patient Name: Desiree Bowers Date of Encounter: 07/29/2023  [facility] Cardiologist: Dina Rich, MD   Subjective   No acute overnight events. She states her breathing has overall improved. However, she has been coughing a lot since yesterday. However, she states she has a chronic cough. No chest pain. She had some dizziness yesterday that she describes as a "spinning" sensation but none today. She is eager to go home.  Inpatient Medications    Scheduled Meds:  aspirin EC  81 mg Oral Daily   insulin aspart  0-5 Units Subcutaneous QHS   insulin aspart  0-9 Units Subcutaneous TID WC   levothyroxine  112 mcg Oral QAC breakfast   metoprolol succinate  100 mg Oral Daily   pantoprazole  40 mg Oral Daily   sodium chloride flush  3 mL Intravenous Q12H   traZODone  50 mg Oral QHS   Warfarin - Pharmacist Dosing Inpatient   Does not apply q1600   Continuous Infusions:  sodium chloride     heparin 1,250 Units/hr (07/29/23 0018)   PRN Meds: sodium chloride, acetaminophen **OR** acetaminophen (TYLENOL) oral liquid 160 mg/5 mL **OR** acetaminophen, hydrOXYzine, labetalol, ondansetron (ZOFRAN) IV, senna-docusate, sodium chloride flush   Vital Signs    Vitals:   07/28/23 2030 07/29/23 0015 07/29/23 0448 07/29/23 0832  BP: (!) 123/55 (!) 97/56 (!) 163/60 (!) 160/62  Pulse: 84 78 81 86  Resp: 18 18 18 18   Temp: 97.8 F (36.6 C) 98.1 F (36.7 C) 97.9 F (36.6 C) 98.6 F (37 C)  TempSrc: Oral Oral Oral Oral  SpO2: 98% 93% 97% 99%  Weight:      Height:        Intake/Output Summary (Last 24 hours) at 07/29/2023 0909 Last data filed at 07/28/2023 1449 Gross per 24 hour  Intake 175.26 ml  Output --  Net 175.26 ml      07/28/2023   12:00 PM 07/28/2023    5:15 AM 07/27/2023    5:07 PM  Last 3 Weights  Weight (lbs) 176 lb 1.6 oz 177 lb 7.5 oz 177 lb 7.5 oz  Weight (kg) 79.878 kg 80.5 kg 80.5 kg      Telemetry    Normal sinus rhythm with rates  in the 70s to 80s. - Personally Reviewed  ECG    No new ECG tracing today. - Personally Reviewed  Physical Exam   GEN: No acute distress.   Neck: No JVD. Cardiac: RRR. No murmurs, rubs, or gallops.  Respiratory: Clear to auscultation bilaterally. No wheezes, rhonchi, or rales. MS: No to trace lower extremity edema bilaterally. Varicose veins noted. Neuro:  No focal deficits. Psych: Normal affect. Responds appropriately.   Labs    High Sensitivity Troponin:   Recent Labs  Lab 07/24/23 1222 07/24/23 1357  TROPONINIHS 18* 18*     Chemistry Recent Labs  Lab 07/27/23 0845 07/27/23 0900 07/28/23 0854 07/28/23 1549 07/28/23 1550 07/29/23 0609  NA  --  137 140 142 140 137  K  --  3.7 4.1 3.9 4.2 4.0  CL  --  100 104  --   --  102  CO2  --  24 26  --   --  23  GLUCOSE  --  107* 88  --   --  101*  BUN  --  17 17  --   --  20  CREATININE  --  0.81 0.75  --   --  0.89  CALCIUM  --  9.1 9.6  --   --  9.5  MG 1.8  --   --   --   --   --   GFRNONAA  --  >60 >60  --   --  >60  ANIONGAP  --  13 10  --   --  12    Lipids  Recent Labs  Lab 07/25/23 0437  CHOL 136  TRIG 106  HDL 31*  LDLCALC 84  CHOLHDL 4.4    Hematology Recent Labs  Lab 07/27/23 0012 07/28/23 0854 07/28/23 1549 07/28/23 1550 07/29/23 0609  WBC 6.3 5.7  --   --  5.4  RBC 4.37 4.53  --   --  4.71  HGB 10.5* 10.9* 11.9* 12.6 11.3*  HCT 35.3* 36.5 35.0* 37.0 37.3  MCV 80.8 80.6  --   --  79.2*  MCH 24.0* 24.1*  --   --  24.0*  MCHC 29.7* 29.9*  --   --  30.3  RDW 21.6* 21.8*  --   --  21.9*  PLT 419* 404*  --   --  398   Thyroid  Recent Labs  Lab 07/25/23 0437  TSH 2.899    BNP Recent Labs  Lab 07/24/23 1223 07/27/23 0012  BNP 770.0* 886.0*    DDimer  Recent Labs  Lab 07/24/23 1357  DDIMER <0.27     Radiology    CARDIAC CATHETERIZATION Result Date: 07/28/2023 1.  Widely patent coronary arteries with minimal irregularity and no significant stenosis 2.  Essentially normal right  heart catheterization data with a mean right atrial pressure of 2 mmHg, pulmonary artery mean pressure of 18 mmHg, wedge pressure of 4 mmHg, LVEDP 6 mmHg, cardiac output 5.5 L/min, and cardiac index 2.9 L/min/m    Cardiac Studies   Echocardiogram 07/25/2023: Impressions: 1. Left ventricular ejection fraction, by estimation, is 45 to 50%. The  left ventricle has mildly decreased function. The left ventricle  demonstrates global hypokinesis. Left ventricular diastolic parameters are  consistent with Grade III diastolic  dysfunction (restrictive). Elevated left atrial pressure. There is the  interventricular septum is flattened in systole and diastole, consistent  with right ventricular pressure and volume overload.   2. Right ventricular systolic function is mildly reduced. The right  ventricular size is moderately enlarged. There is moderately elevated  pulmonary artery systolic pressure.   3. Left atrial size was mildly dilated.   4. A small pericardial effusion is present. The pericardial effusion is  anterior to the right ventricle.   5. The mitral valve is abnormal. Moderate mitral valve regurgitation. No  evidence of mitral stenosis.   6. The tricuspid valve is abnormal. Tricuspid valve regurgitation is  moderate to severe.   7. The aortic valve is tricuspid. There is mild calcification of the  aortic valve. There is mild thickening of the aortic valve. Aortic valve  regurgitation is mild. No aortic stenosis is present.   8. The inferior vena cava is dilated in size with <50% respiratory  variability, suggesting right atrial pressure of 15 mmHg.  _______________  Right/ Left Cardiac Catheterization 07/28/2023: 1.  Widely patent coronary arteries with minimal irregularity and no significant stenosis 2.  Essentially normal right heart catheterization data with a mean right atrial pressure of 2 mmHg, pulmonary artery mean pressure of 18 mmHg, wedge pressure of 4 mmHg, LVEDP 6 mmHg,  cardiac output 5.5 L/min, and cardiac index 2.9 L/min/m  Diagnostic Dominance: Right  Patient Profile     78 y.o. female with a history of paroxysmal atrial fibrillation on Coumadin, hypertension, type 2 diabetes mellitus, hypothyroidism, restless leg syndrome, Paget's disease, and breast cancer s/p right mastectomy in 2016 who was admitted at [facility] on 07/24/2023 for TIA after presenting with worsening dizziness and a fall at home. Work-up showed a mildly reduced EF of 45-50% and grade 3 diastolic dysfunction on Echo. Cardiology was consulted for further evaluation of abnormal Echo at which time patient also reported worsening dyspnea on exertion over the last 6 months. Therefore, she was transferred to [facility] for [facility] which showed normal coronaries with only minimal irregularities and essentially normal RHC.  Assessment & Plan    Dyspnea on Exertion Newly Diagnosed HFmrEF Patient reported 6+ months of worsening dyspnea on exertion. BNP elevated at 770 >>886. Chest x-ray showed mild cardiac enlargement but no acute findings. Echo showed LVEf of 45-50% with global hypokinesis and grade 3 diastolic dysfunction, moderately enlarged RV with mildly reduced RV function, flattened interventricular septum in systolic and diastole consistent with RV pressure and volume overloaded, moderate MR, moderate to severe TR, and a small pericardial effusion. She was given a few doses of IV Lasix (last dose 2/19). R/LHC showed widely patent coronaries with only minimal luminal irregularities and an essentially normal RHC. I/Os have not been fully documented. Weight down 7 lbs from admission. Renal function stable. - Euvolemic on exam.  - She takes PRN Lasix 20mg  for edema at home. Recommend starting Lasix 20mg  daily. - She also has Spironolactone listed under her PTA medications but states she never started this medication after it was prescribed because she was nervous about the side effects.  She would like to try daily Lasix before adding anything new. - Currently on Toprol-XL 100mg  daily. However, would consider switching to Coreg for additional BP control especially since she seems hesitant to start a lot of new medications.  - Of note, I don't think patient's dyspnea was solely caused by volume overload. She also reports a chronic cough for a while now so wonder if she could have some underlying respiratory component as well.  Demand Ischemia High-sensitivity troponin 18 >> 18. Echo as above.  - No chest pain. - Troponin elevation consistent with demand ischemia from acute CHF. Not consistent with ACS. LHC showed only minimal luminal irregularities.  Paroxysmal Atrial Fibrillation Maintaining sinus rhythm on exam.  - Home Lopressor was changed to Toprol-XL 100mg  daily. - On chronic anticoagulation with Coumadin. This was initially held in anticipation of cardiac catheterization but has now been restarted. INR 1.3. Being bridge with IV Heparin. Dosing per Pharmacy.  Moderate Mitral Regurgitation Moderate to Severe Tricuspid Regurgitation Noted on Echo this admission.  - Can continue to monitor as an outpatient.   Hypertension BP has been somewhat labile this admission with systolic BP ranging from 97 to 177. - Currently on Toprol-XL 100mg  daily. Consider switching to Coreg as above.  - Could also start Losartan but she seems hesitant to start a lot of new medications.   Dyslipidemia Lipid panel on 07/25/2023: Total Cholesterol 136, Triglycerides 106, HDL 31, LDL 84. LDL goal <70 given TIA. - Recommended starting a statin but patient would like to hold off on this right now and see how she does with daily Lasix first.   TIA Patient was initially admitted at [facility] for TIA like symptoms. Head CT, brain MRI/MRA, and carotid ultrasounds were unremarkable. - Neuro suspected this was cardioembolic given  Coumadin was subtherapeutic.  - Recommended adding a statin but patient  would like to hold off on this right now and see how she does with daily Lasix first.  Otherwise, per primary team: - Type 2 diabetes mellitus - Restless leg syndrome - Paget's disease - History of breast cancer  For questions or updates, please contact [facility] Please consult [email or web address] for contact info under        Signed, Corrin Parker, PA-C  07/29/2023, 9:09 AM     Attending Note:   The patient was seen and examined.  Agree with assessment and plan as noted above.  Changes made to the above note as needed.  Patient seen and independently examined with Marjie Skiff, PA .   We discussed all aspects of the encounter. I agree with the assessment and plan as stated above.    Dyspnea  :   cath was unremarkable .    She has mildly reduce LVEF at 45-50%.  She has some pulmonary HTN by echo but her right heart pressures were normal by cath .  Perhaps she is eating more salt than she should .    She needs to follow up with Dr. Wyline Mood.   2.  PAF :  with hx of TIA .   If she goes home today she will need to be bridged with Lovenox .  Will defer to Pharm D team for the specifics of this      I have spent a total of 40 minutes with patient reviewing hospital  notes , telemetry, EKGs, labs and examining patient as well as establishing an assessment and plan that was discussed with the patient.  > 50% of time was spent in direct patient care.    Vesta Mixer, Montez Hageman., MD, [facility] 07/29/2023, 11:23 AM [address]  [address] Office [phone] Pager [phone]

## 2023-07-29 NOTE — Telephone Encounter (Signed)
 Patient Product/process development scientist completed.    The patient is insured through [employer]. Patient has Medicare and is not eligible for a copay card, but may be able to apply for patient assistance or Medicare RX Payment Plan (Patient Must reach out to their plan, if eligible for payment plan), if available.    Ran test claim for enoxaparin (Lovenox) 80 mg/0.8 ml and the current 5 day co-pay is $62.45.   This test claim was processed through [facility] [place]- copay amounts may vary at other pharmacies due to pharmacy/plan contracts, or as the patient moves through the different stages of their insurance plan.     Roland Earl, CPHT Pharmacy Technician III Certified Patient Advocate [facility] Patient Advocate Team Direct Number: [phone]  Fax: [phone]

## 2023-07-29 NOTE — Progress Notes (Signed)
 Reviewed AVS, patient expressed understanding of medications, MD follow up reviewed.  Removed IV, Site clean, dry and intact.  Pt transported to entrance A where family member was waiting in vehicle to transport home.

## 2023-07-29 NOTE — Progress Notes (Signed)
 Progress Note   Patient: Desiree Bowers ZOX:[id number] DOB: Jun 04, 1946 DOA: 07/24/2023     3 DOS: the patient was seen and examined on 07/29/2023   Brief hospital course: CANDACE BEGUE is a 78 y.o. female with medical history significant for diabetes mellitus, hypertension, paroxysmal atrial fibrillation.  Patient presented to the ED with complaints of difficulty walking.  She reports she woke up this morning at about 7:30 AM, and was leaning to the right while ambulating, and she could not walk a straight line.  She reports she fell onto her left, she did not hit her head..  She is unable to tell me how long this lasted. She ambulated here in the ED , and this has improved.No change in vision.  No change in speech.  No reports of facial asymmetry.She also reports about 6 weeks ago, she bent over and suddenly became short of breath.  Reports since then has breathing has not been back to normal.  Has a chronic unchanged cough.  No chest pain.  No lower extremity swelling.   Pt was transferred to [facility] for cardiology eval with cardiac cath due to progressive dyspnea. LHC showed only minimal luminal irregularities. Currently pt is being bridged with IV heparin drip/coumadin.  Assessment and Plan: * TIA (transient ischemic attack) w/ ongoing dizziness - ASA 81 mg PO daily  - Meclizine 25 mg PO bid PRN  - Brain MRI and carotid U/S doppler wnl - PT/OT   Combined HFpEF - s/p cardiac cath 07/28/2023 --[facility] showed only minimal luminal irregularities - Lasix 20 mg PO daily    PAF (paroxysmal atrial fibrillation) (HCC) - IV heparin drip & coumadin 5 mg PO daily  - INR in AM - Toprol XL 100 mg PO daily    Acquired hypothyroidism - Synthroid 112 mcg PO daily    DM type 2 with diabetic dyslipidemia (HCC) - Novolog SS ACHS    Essential (primary) hypertension - Toprol XL as above   Subjective: Pt seen and examined at the bedside. She is currently bridging w/ IV heparin drip and coumadin. She  reported to nursing today (who then reported to me) that the pt was having dizziness and requested meclizine. Meclizine was ordered.  Cardiology is of the opinion that the pt can go home with lovenox injections starting today, however, given the ongoing dizziness that was reported today I will hold the pt for today and if her dizziness is better tmr she may be discharged with lovenox injections and outpt INR monitoring. Spoke with the pt's daughter at 12:45PM 07/29/2023 and she was in agreement for the pt to stay overnight until Sat 07/30/2023.  Physical Exam: Vitals:   07/29/23 0015 07/29/23 0448 07/29/23 0832 07/29/23 1144  BP: (!) 97/56 (!) 163/60 (!) 160/62 (!) 154/61  Pulse: 78 81 86 78  Resp: 18 18 18 18   Temp: 98.1 F (36.7 C) 97.9 F (36.6 C) 98.6 F (37 C) 98.4 F (36.9 C)  TempSrc: Oral Oral Oral Oral  SpO2: 93% 97% 99% 98%  Weight:      Height:       HENT:     Head: Normocephalic.     Mouth/Throat:     Mouth: Mucous membranes are moist.  Cardiovascular:     Rate and Rhythm: Normal rate.  Pulmonary:     Effort: Pulmonary effort is normal.  Abdominal:     Palpations: Abdomen is soft.  Musculoskeletal:        General: Normal range  of motion.     Cervical back: Neck supple.  Skin:    General: Skin is warm.  Neurological:     Mental Status: She is alert. Mental status is at baseline.  Psychiatric:        Mood and Affect: Mood normal.      Disposition: Status is: Inpatient Remains inpatient appropriate because: IV heparin/coumadin bridging and dizziness   Planned Discharge Destination: Home    Time spent: 35 minutes  Author: Baron Hamper , MD 07/29/2023 12:44 PM  For on call review [email or web address].

## 2023-07-29 NOTE — Discharge Summary (Addendum)
 Physician Discharge Summary   Patient: Desiree Bowers MRN: [id number] DOB: August 09, 1945  Admit date:     07/24/2023  Discharge date: 07/29/23  Discharge Physician: Baron Hamper    PCP: Kirstie Peri, MD      Discharge Diagnoses: Principal Problem:   TIA (transient ischemic attack) Active Problems:   Chronic cough   Essential (primary) hypertension   DM type 2 with diabetic dyslipidemia (HCC)   GAD (generalized anxiety disorder)   Acquired hypothyroidism   PAF (paroxysmal atrial fibrillation) (HCC)   Cardiomyopathy (HCC)  Resolved Problems:   * No resolved hospital problems. *  Hospital Course: 78 y.o. female with medical history significant for diabetes mellitus, hypertension, paroxysmal atrial fibrillation.  Patient presented to the ED with complaints of difficulty walking.  She reports she woke up this morning at about 7:30 AM, and was leaning to the right while ambulating, and she could not walk a straight line.  She reports she fell onto her left, she did not hit her head..  She is unable to tell me how long this lasted. She ambulated here in the ED , and this has improved.No change in vision.  No change in speech.  No reports of facial asymmetry.She also reports about 6 weeks ago, she bent over and suddenly became short of breath.  Reports since then has breathing has not been back to normal.  Has a chronic unchanged cough.  No chest pain.  No lower extremity swelling.   Pt was transferred to [facility] for cardiology eval with cardiac cath due to progressive dyspnea. LHC showed only minimal luminal irregularities. Currently pt is being bridged with IV heparin drip/coumadin. Brain MRI and carotid U/S doppler wnl. Cardiology advised that the pt could be discharged home on 07/29/2023 with lovenox 80 mg sq q12 and coumadin 5 mg (until she follows up with coumadin clinic next week for an INR check).    Spoke with the pt's daughter twice this afternoon (07/29/2023) and the daughter advised  that the pt will be supervised at home by family members. Thus, the daughter was ok with the pt discharging today 07/29/2023.  Please note cardiology has advised this pt has been newly diagnosed with ACUTE HFmrEF per Dr. Harvie Bridge cardiology note from 07/29/2023.  Discharge Diet Orders (From admission, onward)     Start     Ordered   07/25/23 0000  Diet - low sodium heart healthy        07/25/23 1444           Cardiac diet DISCHARGE MEDICATION: Allergies as of 07/29/2023       Reactions   Mirapex [pramipexole Dihydrochloride] Itching, Swelling   Penicillins Rash   Atorvastatin Other (See Comments)   Fosamax [alendronate] Other (See Comments)   Gabapentin Other (See Comments)   Couldn't move legs during the night after taking.   Levothyroxine Itching   Symbicort [budesonide-formoterol Fumarate] Itching        Medication List     TAKE these medications    aspirin EC 81 MG tablet Take 1 tablet (81 mg total) by mouth daily. Swallow whole. Start taking on: July 30, 2023   calcium carbonate 1500 (600 Ca) MG Tabs tablet Commonly known as: OSCAL Take 2 tablets by mouth daily.   enoxaparin 80 MG/0.8ML injection Commonly known as: LOVENOX Inject 0.8 mLs (80 mg total) into the skin every 12 (twelve) hours for 5 days.   furosemide 20 MG tablet Commonly known as: LASIX Take 1 tablet (20  mg total) by mouth daily. Start taking on: July 30, 2023 What changed:  when to take this reasons to take this   loratadine 10 MG tablet Commonly known as: CLARITIN Take 10 mg by mouth daily.   magnesium oxide 400 (241.3 Mg) MG tablet Commonly known as: MAG-OX Take 400 mg by mouth daily.   meclizine 25 MG tablet Commonly known as: ANTIVERT Take 1 tablet (25 mg total) by mouth 2 (two) times daily as needed for up to 10 days for dizziness.   meloxicam 7.5 MG tablet Commonly known as: MOBIC Take 7.5 mg by mouth as needed for pain.   metoprolol tartrate 50 MG  tablet Commonly known as: LOPRESSOR TAKE 1 TABLET BY MOUTH TWICE A DAY   pantoprazole 40 MG tablet Commonly known as: PROTONIX Take 40 mg by mouth daily.   spironolactone 25 MG tablet Commonly known as: ALDACTONE Take 25 mg by mouth daily.   Synthroid 112 MCG tablet Generic drug: levothyroxine Take 112 mcg by mouth daily before breakfast.   tiZANidine 4 MG capsule Commonly known as: ZANAFLEX Take by mouth.   traZODone 50 MG tablet Commonly known as: DESYREL Take 50 mg by mouth at bedtime.   warfarin 5 MG tablet Commonly known as: COUMADIN Take as directed. If you are unsure how to take this medication, talk to your nurse or doctor. Original instructions: Take 1 tablet (5 mg total) by mouth one time only at 4 PM for 5 days. What changed:  how much to take how to take this when to take this additional instructions        Follow-up Information     Kirstie Peri, MD Follow up.   Specialty: Internal Medicine Contact information: [address] [place] [address] [phone]         Antoine Poche, MD Follow up.   Specialty: Cardiology Why: Appointment scheduled for 08/11/2023 at 10:40am. Contact information: [address] [place] [address] [phone]                Discharge Exam: Ceasar Mons Weights   07/27/23 1707 07/28/23 0515 07/28/23 1200  Weight: 80.5 kg 80.5 kg 79.9 kg   HENT:     Head: Normocephalic.     Mouth/Throat:     Mouth: Mucous membranes are moist.  Cardiovascular:     Rate and Rhythm: Normal rate.  Pulmonary:     Effort: Pulmonary effort is normal.  Abdominal:     Palpations: Abdomen is soft.  Musculoskeletal:        General: Normal range of motion.     Cervical back: Neck supple.  Skin:    General: Skin is warm.  Neurological:     Mental Status: She is alert. Mental status is at baseline.  Psychiatric:        Mood and Affect: Mood normal.   Condition at discharge: fair  The results of significant  diagnostics from this hospitalization (including imaging, microbiology, ancillary and laboratory) are listed below for reference.   Imaging Studies: CARDIAC CATHETERIZATION Result Date: 07/28/2023 1.  Widely patent coronary arteries with minimal irregularity and no significant stenosis 2.  Essentially normal right heart catheterization data with a mean right atrial pressure of 2 mmHg, pulmonary artery mean pressure of 18 mmHg, wedge pressure of 4 mmHg, LVEDP 6 mmHg, cardiac output 5.5 L/min, and cardiac index 2.9 L/min/m   US Carotid Bilateral (at [facility] and [facility] only) Result Date: 07/26/2023 CLINICAL DATA:  Transient ischemic attack EXAM:  BILATERAL CAROTID DUPLEX ULTRASOUND TECHNIQUE: Wallace Cullens scale imaging, color Doppler and duplex ultrasound were performed of bilateral carotid and vertebral arteries in the neck. COMPARISON:  None Available. FINDINGS: Criteria: Quantification of carotid stenosis is based on velocity parameters that correlate the residual internal carotid diameter with NASCET-based stenosis levels, using the diameter of the distal internal carotid lumen as the denominator for stenosis measurement. The following velocity measurements were obtained: RIGHT ICA: 114/18 cm/sec CCA: 62/74 cm/sec SYSTOLIC ICA/CCA RATIO:  2.1 ECA:  52 cm/sec LEFT ICA: 102/21 cm/sec CCA: 11/14 cm/sec SYSTOLIC ICA/CCA RATIO:  2.2 ECA:  35 cm/sec RIGHT CAROTID ARTERY: No significant atherosclerotic plaque or evidence of stenosis in the internal carotid artery. RIGHT VERTEBRAL ARTERY:  Patent with normal antegrade flow. LEFT CAROTID ARTERY: No significant atherosclerotic plaque or evidence of stenosis. LEFT VERTEBRAL ARTERY:  Patent with normal antegrade flow. IMPRESSION: Negative bilateral carotid duplex ultrasound. Electronically Signed   By: Malachy Moan M.D.   On: 07/26/2023 11:10   MR ANGIO HEAD WO CONTRAST Result Date: 07/25/2023 CLINICAL DATA:  Stroke.  Follow-up. EXAM: MRA HEAD WITHOUT CONTRAST TECHNIQUE:  Angiographic images of the Circle of Willis were acquired using MRA technique without intravenous contrast. COMPARISON:  Brain MRI yesterday. FINDINGS: Both internal carotid arteries are widely patent into the brain. No siphon stenosis. The anterior and middle cerebral vessels are patent without proximal stenosis, aneurysm or vascular malformation. Both vertebral arteries are widely patent to the basilar. No basilar stenosis. Posterior circulation branch vessels appear normal. IMPRESSION: Normal intracranial MR angiography of the large and medium size vessels. Electronically Signed   By: Paulina Fusi M.D.   On: 07/25/2023 19:24   ECHOCARDIOGRAM COMPLETE Result Date: 07/25/2023    ECHOCARDIOGRAM REPORT   Patient Name:   Desiree Bowers Ewald Date of Exam: 07/25/2023 Medical Rec #:  [id number]      Height:       65.0 in Accession #:    [id number]     Weight:       181.2 lb Date of Birth:  May 06, 1946      BSA:          1.897 m Patient Age:    77 years       BP:           160/79 mmHg Patient Gender: F              HR:           77 bpm. [facility] Procedure: 2D Echo, Color Doppler and Cardiac Doppler (Both Spectral and Color            Flow Doppler were utilized during procedure). Indications:    TIA G45.9  History:        Patient has prior history of Echocardiogram examinations, most                 recent 06/14/2022. Arrythmias:Atrial Fibrillation; Risk                 Factors:Hypertension and Diabetes.  Sonographer:    Webb Laws Referring Phys: [id number] EJIROGHENE E EMOKPAE IMPRESSIONS  1. Left ventricular ejection fraction, by estimation, is 45 to 50%. The left ventricle has mildly decreased function. The left ventricle demonstrates global hypokinesis. Left ventricular diastolic parameters are consistent with Grade III diastolic dysfunction (restrictive). Elevated left atrial pressure. There is the interventricular septum is flattened in systole and diastole, consistent with right ventricular pressure and  volume overload.  2. Right ventricular  systolic function is mildly reduced. The right ventricular size is moderately enlarged. There is moderately elevated pulmonary artery systolic pressure.  3. Left atrial size was mildly dilated.  4. A small pericardial effusion is present. The pericardial effusion is anterior to the right ventricle.  5. The mitral valve is abnormal. Moderate mitral valve regurgitation. No evidence of mitral stenosis.  6. The tricuspid valve is abnormal. Tricuspid valve regurgitation is moderate to severe.  7. The aortic valve is tricuspid. There is mild calcification of the aortic valve. There is mild thickening of the aortic valve. Aortic valve regurgitation is mild. No aortic stenosis is present.  8. The inferior vena cava is dilated in size with <50% respiratory variability, suggesting right atrial pressure of 15 mmHg. FINDINGS  Left Ventricle: Left ventricular ejection fraction, by estimation, is 45 to 50%. The left ventricle has mildly decreased function. The left ventricle demonstrates global hypokinesis. Strain imaging was not performed. The left ventricular internal cavity  size was normal in size. There is no left ventricular hypertrophy. The interventricular septum is flattened in systole and diastole, consistent with right ventricular pressure and volume overload. Left ventricular diastolic parameters are consistent with Grade III diastolic dysfunction (restrictive). Elevated left atrial pressure. Right Ventricle: The right ventricular size is moderately enlarged. Right vetricular wall thickness was not well visualized. Right ventricular systolic function is mildly reduced. There is moderately elevated pulmonary artery systolic pressure. The tricuspid regurgitant velocity is 2.86 m/s, and with an assumed right atrial pressure of 15 mmHg, the estimated right ventricular systolic pressure is 47.7 mmHg. Left Atrium: Left atrial size was mildly dilated. Right Atrium: Right atrial size was  normal in size. Pericardium: A small pericardial effusion is present. The pericardial effusion is anterior to the right ventricle. Mitral Valve: The mitral valve is abnormal. Moderate mitral valve regurgitation. No evidence of mitral valve stenosis. Tricuspid Valve: The tricuspid valve is abnormal. Tricuspid valve regurgitation is moderate to severe. No evidence of tricuspid stenosis. Aortic Valve: The aortic valve is tricuspid. There is mild calcification of the aortic valve. There is mild thickening of the aortic valve. There is mild aortic valve annular calcification. Aortic valve regurgitation is mild. Aortic regurgitation PHT measures 640 msec. No aortic stenosis is present. Aortic valve mean gradient measures 5.2 mmHg. Aortic valve peak gradient measures 8.8 mmHg. Aortic valve area, by VTI measures 1.77 cm. Pulmonic Valve: The pulmonic valve was not well visualized. Pulmonic valve regurgitation is mild. No evidence of pulmonic stenosis. Aorta: The aortic root and ascending aorta are structurally normal, with no evidence of dilitation. Venous: The inferior vena cava is dilated in size with less than 50% respiratory variability, suggesting right atrial pressure of 15 mmHg. IAS/Shunts: The interatrial septum was not well visualized. Additional Comments: 3D imaging was not performed.  LEFT VENTRICLE PLAX 2D LVIDd:         5.30 cm   Diastology LVIDs:         2.40 cm   LV e' medial:    4.35 cm/s LV PW:         0.90 cm   LV E/e' medial:  28.5 LV IVS:        0.90 cm   LV e' lateral:   5.66 cm/s LVOT diam:     1.80 cm   LV E/e' lateral: 21.9 LV SV:         51 LV SV Index:   27 LVOT Area:     2.54 cm  RIGHT VENTRICLE  IVC RV Basal diam:  3.30 cm    IVC diam: 2.20 cm RV Mid diam:    2.50 cm RV S prime:     8.59 cm/s TAPSE (M-mode): 1.8 cm LEFT ATRIUM             Index        RIGHT ATRIUM           Index LA diam:        5.20 cm 2.74 cm/m   RA Area:     18.00 cm LA Vol (A2C):   79.0 ml 41.65 ml/m  RA Volume:    45.90 ml  24.20 ml/m LA Vol (A4C):   61.9 ml 32.63 ml/m LA Biplane Vol: 70.4 ml 37.11 ml/m  AORTIC VALVE AV Area (Vmax):    1.92 cm AV Area (Vmean):   1.81 cm AV Area (VTI):     1.77 cm AV Vmax:           148.60 cm/s AV Vmean:          109.162 cm/s AV VTI:            0.288 m AV Peak Grad:      8.8 mmHg AV Mean Grad:      5.2 mmHg LVOT Vmax:         112.00 cm/s LVOT Vmean:        77.800 cm/s LVOT VTI:          0.201 m LVOT/AV VTI ratio: 0.70 AI PHT:            640 msec  AORTA Ao Root diam: 2.50 cm Ao Asc diam:  3.20 cm MITRAL VALVE                 TRICUSPID VALVE MV Area (PHT): 5.97 cm      TR Peak grad:   32.7 mmHg MV Decel Time: 127 msec      TR Vmax:        286.00 cm/s MR Peak grad:   111.5 mmHg MR Mean grad:   72.0 mmHg    SHUNTS MR Vmax:        528.00 cm/s  Systemic VTI:  0.20 m MR Vmean:       397.5 cm/s   Systemic Diam: 1.80 cm MR PISA:        1.01 cm MR PISA Radius: 0.40 cm MV E velocity: 124.00 cm/s MV A velocity: 59.20 cm/s MV E/A ratio:  2.09 Dina Rich MD Electronically signed by Dina Rich MD Signature Date/Time: 07/25/2023/2:42:45 PM    Final    MR BRAIN WO CONTRAST Result Date: 07/24/2023 CLINICAL DATA:  Provided history: Neuro deficit, acute, stroke suspected. Fall this morning due to imbalance. EXAM: MRI HEAD WITHOUT CONTRAST TECHNIQUE: Multiplanar, multiecho pulse sequences of the brain and surrounding structures were obtained without intravenous contrast. COMPARISON:  Head CT 07/24/2023.  Brain MRI 08/29/2019. FINDINGS: Brain: No age-advanced or lobar predominant cerebral atrophy. Multifocal T2 FLAIR hyperintense signal abnormality within the cerebral white matter, nonspecific but compatible with mild-to-moderate chronic small vessel ischemic disease. There is no acute infarct. No evidence of an intracranial mass. No chronic intracranial blood products. No extra-axial fluid collection. No midline shift. Vascular: Maintained flow voids within the proximal large arterial  vessels. Skull and upper cervical spine: No focal worrisome marrow lesion. Sinuses/Orbits: No mass or acute finding within the imaged orbits. Prior bilateral ocular lens replacement. No significant paranasal sinus disease. IMPRESSION: 1.  No evidence of an  acute intracranial abnormality. 2. Mild-to-moderate chronic small vessel ischemic changes within the cerebral white matter, similar to the prior brain MRI of 08/29/2019. Electronically Signed   By: Jackey Loge D.O.   On: 07/24/2023 16:04   CT Head Wo Contrast Result Date: 07/24/2023 CLINICAL DATA:  Neck trauma.  Minor head trauma. EXAM: CT HEAD WITHOUT CONTRAST CT CERVICAL SPINE WITHOUT CONTRAST TECHNIQUE: Multidetector CT imaging of the head and cervical spine was performed following the standard protocol without intravenous contrast. Multiplanar CT image reconstructions of the cervical spine were also generated. RADIATION DOSE REDUCTION: This exam was performed according to the departmental dose-optimization program which includes automated exposure control, adjustment of the mA and/or kV according to patient size and/or use of iterative reconstruction technique. COMPARISON:  Brain MRI 08/29/2019 FINDINGS: CT HEAD FINDINGS Brain: No evidence of acute infarction, hemorrhage, hydrocephalus, extra-axial collection or mass lesion/mass effect. Cerebral volume loss and white matter low-density that is mild for age. Vascular: No hyperdense vessel or unexpected calcification. Skull: Normal. Negative for fracture or focal lesion. Sinuses/Orbits: No evidence of injury CT CERVICAL SPINE FINDINGS Alignment: No traumatic malalignment Skull base and vertebrae: No acute fracture. No primary bone lesion or focal pathologic process. Generalized osteopenia. 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 No visible canal hematoma. Disc levels:  Negative Upper chest: Negative IMPRESSION: No evidence of acute intracranial or cervical spine injury. Electronically Signed   By: Tiburcio Pea M.D.   On: 07/24/2023 12:52   CT Cervical Spine Wo Contrast Result Date: 07/24/2023 CLINICAL DATA:  Neck trauma.  Minor head trauma. EXAM: CT HEAD WITHOUT CONTRAST CT CERVICAL SPINE WITHOUT CONTRAST TECHNIQUE: Multidetector CT imaging of the head and cervical spine was performed following the standard protocol without intravenous contrast. Multiplanar CT image reconstructions of the cervical spine were also generated. RADIATION DOSE REDUCTION: This exam was performed according to the departmental dose-optimization program which includes automated exposure control, adjustment of the mA and/or kV according to patient size and/or use of iterative reconstruction technique. COMPARISON:  Brain MRI 08/29/2019 FINDINGS: CT HEAD FINDINGS Brain: No evidence of acute infarction, hemorrhage, hydrocephalus, extra-axial collection or mass lesion/mass effect. Cerebral volume loss and white matter low-density that is mild for age. Vascular: No hyperdense vessel or unexpected calcification. Skull: Normal. Negative for fracture or focal lesion. Sinuses/Orbits: No evidence of injury CT CERVICAL SPINE FINDINGS Alignment: No traumatic malalignment Skull base and vertebrae: No acute fracture. No primary bone lesion or focal pathologic process. Generalized osteopenia. 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 No visible canal hematoma. Disc levels:  Negative Upper chest: Negative IMPRESSION: No evidence of acute  intracranial or cervical spine injury. Electronically Signed   By: Tiburcio Pea M.D.   On: 07/24/2023 12:52   DG Chest Portable 1 View Result Date: 07/24/2023 CLINICAL DATA:  Shortness of breath EXAM: PORTABLE CHEST 1 VIEW COMPARISON:  Chest radiograph 07/07/2023 and chest CT from 07/10/2019 FINDINGS: Mild cardiac enlargement. Aortic atherosclerosis. No pleural fluid, interstitial edema or airspace disease. Increased sclerosis involving the right humeral head, right clavicle  and right scapula. This appears unchanged from the prior exam. These findings are better assessed on prior CT from 07/10/2019. IMPRESSION: 1. No acute cardiopulmonary abnormalities. 2. Mild cardiac enlargement. 3. Unchanged increased sclerosis involving the right humeral head, right clavicle and right scapula. Electronically Signed   By: Signa Kell M.D.   On: 07/24/2023 11:45    Microbiology: Results for orders placed or performed during the hospital encounter of 07/24/23  Resp panel by RT-PCR (RSV, Flu A&B, Covid) Anterior Nasal Swab     Status: None   Collection Time: 07/24/23 12:29 PM   Specimen: Anterior Nasal Swab  Result Value Ref Range Status   SARS Coronavirus 2 by RT PCR NEGATIVE NEGATIVE Final    Comment: (NOTE) SARS-CoV-2 target nucleic acids are NOT DETECTED.  The SARS-CoV-2 RNA is generally detectable in upper respiratory specimens during the acute phase of infection. The lowest concentration of SARS-CoV-2 viral copies this assay can detect is 138 copies/mL. A negative result does not preclude SARS-Cov-2 infection and should not be used as the sole basis for treatment or other patient management decisions. A negative result may occur with  improper specimen collection/handling, submission of specimen other than nasopharyngeal swab, presence of viral mutation(s) within the areas targeted by this assay, and inadequate number of viral copies(<138 copies/mL). A negative result must be combined  with clinical observations, patient history, and epidemiological information. The expected result is Negative.  Fact Sheet for Patients:  BloggerCourse.com  Fact Sheet for Healthcare Providers:  SeriousBroker.it  This test is no t yet approved or cleared by the [place] FDA and  has been authorized for detection and/or diagnosis of SARS-CoV-2 by FDA under an Emergency Use Authorization (EUA). This EUA will remain  in effect (meaning this test can be used) for the duration of the COVID-19 declaration under Section 564(b)(1) of the Act, 21 U.S.C.section 360bbb-3(b)(1), unless the authorization is terminated  or revoked sooner.       Influenza A by PCR NEGATIVE NEGATIVE Final   Influenza B by PCR NEGATIVE NEGATIVE Final    Comment: (NOTE) The Xpert Xpress SARS-CoV-2/FLU/RSV plus assay is intended as an aid in the diagnosis of influenza from Nasopharyngeal swab specimens and should not be used as a sole basis for treatment. Nasal washings and aspirates are unacceptable for Xpert Xpress SARS-CoV-2/FLU/RSV testing.  Fact Sheet for Patients: BloggerCourse.com  Fact Sheet for Healthcare Providers: SeriousBroker.it  This test is not yet approved or cleared by the [place] FDA and has been authorized for detection and/or diagnosis of SARS-CoV-2 by FDA under an Emergency Use Authorization (EUA). This EUA will remain in effect (meaning this test can be used) for the duration of the COVID-19 declaration under Section 564(b)(1) of the Act, 21 U.S.C. section 360bbb-3(b)(1), unless the authorization is terminated or revoked.     Resp Syncytial Virus by PCR NEGATIVE NEGATIVE Final    Comment: (NOTE) Fact Sheet for Patients: BloggerCourse.com  Fact Sheet for Healthcare Providers: SeriousBroker.it  This test is not yet approved  or cleared by the [place] FDA and has been authorized for detection and/or diagnosis of SARS-CoV-2 by FDA under an Emergency Use Authorization (EUA). This EUA will remain in effect (meaning this test can be used) for the duration of the COVID-19 declaration under Section 564(b)(1) of the Act, 21 U.S.C. section 360bbb-3(b)(1), unless the authorization is terminated or revoked.  Performed at [facility], [address] [place], [place] [address]     Labs: CBC: Recent Labs  Lab 07/24/23 1126 07/25/23 0437 07/26/23 0326 07/27/23 0012 07/28/23 0854 07/28/23 1549 07/28/23 1550 07/29/23 0609  WBC 5.4 5.9 6.1 6.3 5.7  --   --  5.4  NEUTROABS 2.8  --   --   --   --   --   --   --   HGB 10.4* 9.9* 9.6* 10.5* 10.9* 11.9* 12.6 11.3*  HCT 35.5* 33.4* 32.4* 35.3* 36.5 35.0* 37.0 37.3  MCV 82.2 81.9 81.6 80.8 80.6  --   --  79.2*  PLT 403* 392 388 419* 404*  --   --  398   Basic Metabolic Panel: Recent Labs  Lab 07/24/23 1126 07/27/23 0845 07/27/23 0900 07/28/23 0854 07/28/23 1549 07/28/23 1550 07/29/23 0609  NA 137  --  137 140 142 140 137  K 3.8  --  3.7 4.1 3.9 4.2 4.0  CL 102  --  100 104  --   --  102  CO2 27  --  24 26  --   --  23  GLUCOSE 155*  --  107* 88  --   --  101*  BUN 16  --  17 17  --   --  20  CREATININE 0.71  --  0.81 0.75  --   --  0.89  CALCIUM 9.0  --  9.1 9.6  --   --  9.5  MG  --  1.8  --   --   --   --   --    Liver Function Tests: No results for input(s): "AST", "ALT", "ALKPHOS", "BILITOT", "PROT", "ALBUMIN" in the last 168 hours. CBG: Recent Labs  Lab 07/28/23 1633 07/28/23 1836 07/28/23 2110 07/29/23 0701 07/29/23 1149  GLUCAP 76 79 127* 132* 125*    Discharge time spent: greater than 30 minutes.  Signed: Baron Hamper , MD [facility] 07/29/2023

## 2023-08-04 ENCOUNTER — Telehealth: Payer: Self-pay | Admitting: *Deleted

## 2023-08-04 NOTE — Telephone Encounter (Signed)
 In [facility] 2/16 - 2/21 for TIA Was discharged on warfarin 5mg  daily and Lovenox 80mg  twice daily x 5 days (INR was 1.3 at D/C).  Pt doesn't know how to continue warfarin.  Told her to take 5mg  daily except 2.5mg  on Saturday and Monday until I see her on Tuesday 3/4.  She verbalized understanding.

## 2023-08-09 ENCOUNTER — Ambulatory Visit: Payer: Medicare Other | Attending: Cardiology | Admitting: *Deleted

## 2023-08-09 DIAGNOSIS — I480 Paroxysmal atrial fibrillation: Secondary | ICD-10-CM | POA: Insufficient documentation

## 2023-08-09 DIAGNOSIS — Z5181 Encounter for therapeutic drug level monitoring: Secondary | ICD-10-CM | POA: Insufficient documentation

## 2023-08-09 LAB — POCT INR: INR: 2.3 (ref 2.0–3.0)

## 2023-08-09 NOTE — Patient Instructions (Signed)
 S/P hospital.  D/C on warfarin 1 tablet daily and Lovenox. Increase warfarin to 1 tablet daily except 1/2 tablet on Mondays, Wednesdays and Fridays Continue to eat 2 serving of greens/week.  Recheck in 1 week

## 2023-08-11 ENCOUNTER — Ambulatory Visit: Payer: Medicare Other | Attending: Cardiology | Admitting: Cardiology

## 2023-08-11 ENCOUNTER — Encounter: Payer: Self-pay | Admitting: Cardiology

## 2023-08-11 VITALS — BP 138/70 | HR 93 | Ht 65.0 in | Wt 182.8 lb

## 2023-08-11 DIAGNOSIS — I10 Essential (primary) hypertension: Secondary | ICD-10-CM | POA: Diagnosis present

## 2023-08-11 DIAGNOSIS — I480 Paroxysmal atrial fibrillation: Secondary | ICD-10-CM | POA: Diagnosis present

## 2023-08-11 DIAGNOSIS — I5022 Chronic systolic (congestive) heart failure: Secondary | ICD-10-CM | POA: Diagnosis present

## 2023-08-11 DIAGNOSIS — D6869 Other thrombophilia: Secondary | ICD-10-CM

## 2023-08-11 MED ORDER — CARVEDILOL 12.5 MG PO TABS: 12.5000 mg | ORAL_TABLET | Freq: Two times a day (BID) | ORAL | 6 refills | Status: DC

## 2023-08-11 NOTE — Progress Notes (Signed)
 Clinical Summary Desiree Bowers is a 78 y.o.female seen today for follow up of the following medical problems.     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   07/2023  Echo: LVEF 45-50%, grade III dd, mild D shaped septum, mild RV dysfunction, mod RVE, mod pulm HTN, mild LAE, mod MR, mod to severe TR.  - BNP 770, CXR no acute process - 07/2023 cath: normal coronaries. Mean PA 18, PCWP 4, CI 2.9. Weight on that day was 177 lbs.    - reports cough on losartan per her report - aldactone caused breast tenderness - was on toprol 100mg  during admission but swtiched to lopressor 50mg  bid at discharge, I'm unclear why - no recent SOB/DOE. No recent LE edema - compliant with meds   2. PAF -08/2019 monitor showed episodes of PAF  DOACs were too expensive -- during 07/2023 admission pharmacy ran the two meds. Eliquis copay is $556.87, Xarelto copay is $555.29 , copays so high due to a $590.00 deductible    - no recent palpitations - no bleeding on coumadin.      3. Vision changes - seen in ER 08/29/19 with acute vision change - MRI without acute findings - seen by optho, diagnosed with retinal ischemia.  - may have been related to her PAF which just diagnosed by monitor.      4. Valvular heart disease -07/2023  Echo: LVEF 45-50%, grade III dd, mild D shaped septum, mild RV dysfunction, mod RVE, mod pulm HTN, mild LAE, mod MR, mod to severe TR.    5. HTN  - had some leg swelling on amlodopine per her report - reports cough on losartan per her report - aldactone caused breast tenderness - compliant with meds   - compliant with meds,reports pcp stopped chlorthalidone she is not sure the reason  - home bp's 140s/80s     5. LE edema - takes prn lasix and swelling is controlled  6. TIA like symptoms - admission 07/2023 - from neuro note thought possible cardioembolic event  given subthereapeutic INR   Past Medical History:  Diagnosis Date   Cancer [facility])    breast    Coronary artery disease due to lipid rich plaque 04/13/2016   Diabetes mellitus without complication (HCC)    Diverticulosis of intestine without bleeding 12/13/2016   History of right mastectomy 11/15/2014   Hypothyroidism    Overactive bladder    Paget's disease of the bone    Paroxysmal atrial fibrillation (HCC) 06/19/2014   Restless leg syndrome      Allergies  Allergen Reactions   Mirapex [Pramipexole Dihydrochloride] Itching and Swelling   Penicillins Rash   Atorvastatin Other (See Comments)   Fosamax [Alendronate] Other (See Comments)   Gabapentin Other (See Comments)    Couldn't move legs during the night after taking.   Levothyroxine Itching   Symbicort [Budesonide-Formoterol Fumarate] Itching     Current Outpatient Medications  Medication Sig Dispense Refill   calcium carbonate (OSCAL) 1500 (600 Ca) MG TABS tablet Take 2 tablets by mouth daily.     furosemide (LASIX) 20 MG tablet Take 1 tablet (20 mg total) by mouth daily. 30 tablet 0   loratadine (CLARITIN) 10 MG tablet Take 10 mg by mouth daily.     magnesium oxide (MAG-OX) 400 (241.3 Mg) MG tablet Take 400 mg by mouth daily.  meloxicam (MOBIC) 7.5 MG tablet Take 7.5 mg by mouth as needed for pain.     metoprolol tartrate (LOPRESSOR) 50 MG tablet TAKE 1 TABLET BY MOUTH TWICE A DAY 180 tablet 1   pantoprazole (PROTONIX) 40 MG tablet Take 40 mg by mouth daily.     SYNTHROID 112 MCG tablet Take 112 mcg by mouth daily before breakfast.      tiZANidine (ZANAFLEX) 4 MG capsule Take by mouth.     traZODone (DESYREL) 50 MG tablet Take 50 mg by mouth at bedtime.     warfarin (COUMADIN) 5 MG tablet Take 1 tablet (5 mg total) by mouth one time only at 4 PM for 5 days. 5 tablet 0   spironolactone (ALDACTONE) 25 MG tablet Take 25 mg by mouth daily. (Patient not taking: Reported on 07/25/2023)     No current facility-administered  medications for this visit.     Past Surgical History:  Procedure Laterality Date   ABDOMINAL HYSTERECTOMY  1999   APPENDECTOMY     as a child   CATARACT EXTRACTION Bilateral Fall 2013   Dr. Emmit Pomfret   CATARACT EXTRACTION, BILATERAL     EYE SURGERY Bilateral Fall 2013   Cat Sx - Dr. Emmit Pomfret   MASTECTOMY Right    RIGHT/LEFT HEART CATH AND CORONARY ANGIOGRAPHY N/A 07/28/2023   Procedure: RIGHT/LEFT HEART CATH AND CORONARY ANGIOGRAPHY;  Surgeon: Tonny Bollman, MD;  Location: [facility] INVASIVE CV LAB;  Service: Cardiovascular;  Laterality: N/A;     Allergies  Allergen Reactions   Mirapex [Pramipexole Dihydrochloride] Itching and Swelling   Penicillins Rash   Atorvastatin Other (See Comments)   Fosamax [Alendronate] Other (See Comments)   Gabapentin Other (See Comments)    Couldn't move legs during the night after taking.   Levothyroxine Itching   Symbicort [Budesonide-Formoterol Fumarate] Itching      Family History  Problem Relation Age of Onset   Breast cancer Mother    Colon cancer Father    Kidney cancer Father    Bladder Cancer Sister    Suicidality Brother    Heart attack Maternal Grandmother    Breast cancer Sister    Stomach cancer Sister    Ovarian cancer Sister    Breast cancer Sister    Breast cancer Sister    Diabetes Sister    Other Brother        parathyroid disorder; Engelmann's disease   Angelman syndrome Son    Luiz Blare' disease Daughter      Social History Desiree Bowers reports that she has quit smoking. She has never used smokeless tobacco. Desiree Bowers reports no history of alcohol use.    Physical Examination Vitals:   08/11/23 1044 08/11/23 1103  BP: (!) 142/74 138/70  Pulse: 93   SpO2: 98%    Filed Weights   08/11/23 1044  Weight: 182 lb 12.8 oz (82.9 kg)    Gen: resting comfortably, no acute distress HEENT: no scleral icterus, pupils equal round and reactive, no palptable cervical adenopathy,  CV: RRR, no mrg, no jvd Resp: Clear to auscultation  bilaterally GI: abdomen is soft, non-tender, non-distended, normal bowel sounds, no hepatosplenomegaly MSK: extremities are warm, no edema.  Skin: warm, no rash Neuro:  no focal deficits Psych: appropriate affect   Diagnostic Studies 08/2015 cath [facility] FINDINGS:  Coronary Angiography 1. Left Main - Normal 2. Left anterior descending artery - the LAD gives off a small first diagonal and a large second diagonal, normal. 3. Diagonals - Normal 4. Left  Circumflex - the circumflex gives off a large OM1, small OM 2, and moderately large OM 3, normal. 5. Obtuse Marginals - Normal 6. Right Coronary Artery - the RCA gives off the PDA and small PLV branches, normal.  There is a conus Cristiana Yochim that originates on the aorta, dual lumen with the RCA. 7. Posterior Descending Artery - Normal  Additional comments on angiography: Right Dominance   1.  Normal coronary arteries. 2.  LVEDP 20 3.  RRA access.  There was spasm of the radial artery near the level of the elbow so access was changed from RRA to right femoral artery.  RFA was closed with Perclose vascular suture device. 4. No Complications.   RECOMMENDATIONS:  1. Continue workup for etiology of current symptoms. 2. Followup with Dr. Molly Maduro for with her PCP. 3. Continue risk factor modification   10/2019 echo IMPRESSIONS     1. Left ventricular ejection fraction, by estimation, is 55 to 60%. The  left ventricle has normal function. The left ventricle has no regional  wall motion abnormalities. There is mild left ventricular hypertrophy of  the basal-septal segment. Left  ventricular diastolic parameters are consistent with Grade I diastolic  dysfunction (impaired relaxation).   2. Right ventricular systolic function is low normal. The right  ventricular size is normal. There is normal pulmonary artery systolic  pressure.   3. The mitral valve is grossly normal. Mild to moderate mitral valve  regurgitation.   4. Tricuspid valve  regurgitation is moderate.   5. The aortic valve is tricuspid. Aortic valve regurgitation is trivial.  No aortic stenosis is present.   6. The inferior vena cava is normal in size with greater than 50%  respiratory variability, suggesting right atrial pressure of 3 mmHg.      Jan 2024 echo   1. Left ventricular ejection fraction, by estimation, is 60 to 65%. The  left ventricle has normal function. The left ventricle has no regional  wall motion abnormalities. Left ventricular diastolic parameters are  consistent with Grade I diastolic  dysfunction (impaired relaxation). The average left ventricular global  longitudinal strain is -22.0 %. The global longitudinal strain is normal.   2. Right ventricular systolic function is normal. The right ventricular  size is normal.   3. The mitral valve is abnormal. Mild mitral valve regurgitation. No  evidence of mitral stenosis.   4. The tricuspid valve is abnormal.   5. The aortic valve has an indeterminant number of cusps. There is mild  calcification of the aortic valve. There is mild thickening of the aortic  valve. Aortic valve regurgitation is not visualized. No aortic stenosis is  present.   6. The inferior vena cava is normal in size with greater than 50%  respiratory variability, suggesting right atrial pressure of 3 mmHg.       Assessment and Plan  1.PAF/acquired thrombophilia - eliquis and xarelto were too expensive, she is on coumadin =- no symptoms. With HFmrEF and HTN change lopressor to coreg 12.5mg  bid.    2. Chronic HFmrEF - no symptoms, euvolemic today - change lopressor to coreg 12.5mg  bid. Prior cough on losartan, breast tenderness on aldactone. She is not interested in starting SGLT2i  3. HTN - above goal, change lopressor to coreg - prior med side effects as listed above.    F/u 4 months     Antoine Poche, M.D.

## 2023-08-11 NOTE — Patient Instructions (Signed)
 Medication Instructions:   Stop Aspirin  Stop Lopressor (Metoprolol) Begin Coreg 12.5mg  twice a day   Continue all other medications.     Labwork:  none  Testing/Procedures:  none  Follow-Up:  4 months   Any Other Special Instructions Will Be Listed Below (If Applicable).   If you need a refill on your cardiac medications before your next appointment, please call your pharmacy.

## 2023-08-15 ENCOUNTER — Telehealth: Payer: Self-pay | Admitting: Cardiology

## 2023-08-15 NOTE — Telephone Encounter (Signed)
 Pt c/o medication issue:  1. Name of Medication:   carvedilol (COREG) 12.5 MG tablet   2. How are you currently taking this medication (dosage and times per day)?   As prescribed  3. Are you having a reaction (difficulty breathing--STAT)?   Back pain  4. What is your medication issue?   Patient stated this medication caused her to have very bad back pain and she stopped taking the medication yesterday.  Patient wants alternate medication.

## 2023-08-15 NOTE — Telephone Encounter (Signed)
 Listed as 6% invcidence of muscle aches though I personally have never had a patient have those symptoms. Its hard to be confident that was definitely the cause. Did the pain resolve after taking? I would recommend retrying and if recurrent then more clear it is an issue  Dominga Ferry MD

## 2023-08-16 ENCOUNTER — Ambulatory Visit: Attending: Cardiology | Admitting: *Deleted

## 2023-08-16 DIAGNOSIS — I480 Paroxysmal atrial fibrillation: Secondary | ICD-10-CM | POA: Diagnosis present

## 2023-08-16 DIAGNOSIS — Z5181 Encounter for therapeutic drug level monitoring: Secondary | ICD-10-CM | POA: Diagnosis not present

## 2023-08-16 LAB — POCT INR: INR: 1.6 — AB (ref 2.0–3.0)

## 2023-08-16 NOTE — Patient Instructions (Signed)
 Increase warfarin to 1 tablet daily except 1/2 tablet on Mondays Continue to eat 2 serving of greens/week.  Recheck in 1 week

## 2023-08-16 NOTE — Telephone Encounter (Signed)
 Called patient back regarding medication symptoms. She stated that her back pain has stopped and went away the since she has stopped medication.  Refused to start Coreg back up due the amount of pain she was in. She almost went to the ER but was afraid they was going to keep her. Would like to try a different medication.

## 2023-08-17 NOTE — Telephone Encounter (Signed)
 Patient called back today and stated that she went ahead tried taking Coreg again last night and so far she hasn't had any back pain. Will keep an eye on her symptoms though.

## 2023-08-23 ENCOUNTER — Ambulatory Visit: Attending: Cardiology | Admitting: *Deleted

## 2023-08-23 DIAGNOSIS — Z5181 Encounter for therapeutic drug level monitoring: Secondary | ICD-10-CM | POA: Insufficient documentation

## 2023-08-23 DIAGNOSIS — I480 Paroxysmal atrial fibrillation: Secondary | ICD-10-CM | POA: Insufficient documentation

## 2023-08-23 LAB — POCT INR: INR: 2.6 (ref 2.0–3.0)

## 2023-08-23 NOTE — Patient Instructions (Signed)
 Continue warfarin 1 tablet daily except 1/2 tablet on Mondays Continue to eat 2 serving of greens/week.  Recheck in 3 weeks

## 2023-08-25 ENCOUNTER — Encounter: Payer: Self-pay | Admitting: Cardiology

## 2023-08-30 ENCOUNTER — Telehealth: Payer: Self-pay | Admitting: Cardiology

## 2023-08-30 NOTE — Telephone Encounter (Signed)
 Pt c/o medication issue:  1. Name of Medication: carvedilol (COREG) 12.5 MG tablet  2. How are you currently taking this medication (dosage and times per day)? Yes  3. Are you having a reaction (difficulty breathing--STAT)? No  4. What is your medication issue? Pt calling in regards to this medication she would like to be prescribed an alternatives due to the medication making her cough constantly.

## 2023-08-31 MED ORDER — METOPROLOL SUCCINATE ER 100 MG PO TB24: 100.0000 mg | ORAL_TABLET | Freq: Every day | ORAL | 6 refills | Status: DC

## 2023-08-31 NOTE — Telephone Encounter (Signed)
 Can stop coreg, start toprol 100mg  daily  Dominga Ferry MD

## 2023-08-31 NOTE — Telephone Encounter (Signed)
 Patient notified and verbalized understanding.  New medication sent to [facility].

## 2023-08-31 NOTE — Telephone Encounter (Signed)
 Pt is calling checking the status of this message

## 2023-09-14 ENCOUNTER — Ambulatory Visit: Attending: Cardiology | Admitting: *Deleted

## 2023-09-14 DIAGNOSIS — Z5181 Encounter for therapeutic drug level monitoring: Secondary | ICD-10-CM | POA: Insufficient documentation

## 2023-09-14 DIAGNOSIS — I480 Paroxysmal atrial fibrillation: Secondary | ICD-10-CM | POA: Insufficient documentation

## 2023-09-14 LAB — POCT INR: INR: 3.8 — AB (ref 2.0–3.0)

## 2023-09-14 NOTE — Patient Instructions (Signed)
 Hold warfarin tomorrow then decrease dose to 1 tablet daily except 1/2 tablet on Mondays and Thursdays Continue to eat 2 serving of greens/week.  Recheck in 3 weeks

## 2023-10-05 ENCOUNTER — Ambulatory Visit: Attending: Cardiology

## 2023-10-05 DIAGNOSIS — Z5181 Encounter for therapeutic drug level monitoring: Secondary | ICD-10-CM | POA: Insufficient documentation

## 2023-10-05 DIAGNOSIS — I480 Paroxysmal atrial fibrillation: Secondary | ICD-10-CM | POA: Insufficient documentation

## 2023-10-05 LAB — POCT INR: INR: 3.3 — AB (ref 2.0–3.0)

## 2023-10-05 NOTE — Patient Instructions (Signed)
 Description   Hold warfarin tomorrow then decrease dose to 1 tablet daily except 1/2 tablet on Mondays, Wednesdays and Fridays. Continue to eat 2 serving of greens/week.  Recheck in 3 weeks

## 2023-10-06 ENCOUNTER — Encounter: Payer: Self-pay | Admitting: Neurology

## 2023-10-06 ENCOUNTER — Ambulatory Visit: Payer: Medicare Other | Admitting: Neurology

## 2023-10-06 VITALS — BP 133/69 | HR 85 | Ht 65.0 in | Wt 184.0 lb

## 2023-10-06 DIAGNOSIS — R2689 Other abnormalities of gait and mobility: Secondary | ICD-10-CM

## 2023-10-06 DIAGNOSIS — R269 Unspecified abnormalities of gait and mobility: Secondary | ICD-10-CM

## 2023-10-06 NOTE — Progress Notes (Signed)
 [facility]  PATIENT: Desiree Bowers DOB: [birth year]  REQUESTING CLINICIAN: Arleene Lack, MD HISTORY FROM: Patient/Chart review  REASON FOR VISIT: abnormal gait    HISTORICAL  CHIEF COMPLAINT:  Chief Complaint  Patient presents with   New Patient (Initial Visit)    Rm12, alone, NP/internal hospital referral for TIA: having balance issues almost took a fall in the lobby but I caught her     HISTORY OF PRESENT ILLNESS:  This is a 78 year old with history of atrial fibrillation, hypertension, diabetes mellitus who is presenting after being admitted to the hospital for abnormal gait and found to progressive dyspnea and transferred to [facility]. When it comes to the abnormal gait, she feels like she is veering to the right when walking, denies any weakness, no changes in vision, no other abnormalities. Her MRI was completed and negative for stroke. She tells me that she was referred to PT but has not heard anything since discharge from the hospital.     Hospital course and summary  78 y.o. female with medical history significant for diabetes mellitus, hypertension, paroxysmal atrial fibrillation.  Patient presented to the ED with complaints of difficulty walking.  She reports she woke up this morning at about 7:30 AM, and was leaning to the right while ambulating, and she could not walk a straight line.  She reports she fell onto her left, she did not hit her head..  She is unable to tell me how long this lasted. She ambulated here in the ED , and this has improved.No change in vision.  No change in speech.  No reports of facial asymmetry.She also reports about 6 weeks ago, she bent over and suddenly became short of breath.  Reports since then has breathing has not been back to normal.  Has a chronic unchanged cough.  No chest pain.  No lower extremity swelling.   Pt was transferred to [facility] for cardiology eval with cardiac cath due to progressive dyspnea.  LHC showed only minimal luminal irregularities. Currently pt is being bridged with IV heparin  drip/coumadin . Brain MRI and carotid U/S doppler wnl. Cardiology advised that the pt could be discharged home on 07/29/2023 with lovenox  80 mg sq q12 and coumadin  5 mg (until she follows up with coumadin  clinic next week for an INR check).     Spoke with the pt's daughter twice this afternoon (07/29/2023) and the daughter advised that the pt will be supervised at home by family members. Thus, the daughter was ok with the pt discharging today 07/29/2023.  OTHER MEDICAL CONDITIONS: Hypertension, atrial fibrillation, Diabetes    REVIEW OF SYSTEMS: Full 14 system review of systems performed and negative with exception of: As noted in the HPI  ALLERGIES: Allergies  Allergen Reactions   Mirapex  [Pramipexole  Dihydrochloride] Itching and Swelling   Penicillins Rash   Atorvastatin  Other (See Comments)   Fosamax [Alendronate] Other (See Comments)   Gabapentin Other (See Comments)    Couldn't move legs during the night after taking.   Levothyroxine  Itching   Symbicort  [Budesonide -Formoterol  Fumarate] Itching    HOME MEDICATIONS: Outpatient Medications Prior to Visit  Medication Sig Dispense Refill   amLODipine (NORVASC) 2.5 MG tablet Take 2.5 mg by mouth daily.     calcium  carbonate (OSCAL) 1500 (600 Ca) MG TABS tablet Take 2 tablets by mouth daily.     furosemide  (LASIX ) 20 MG tablet Take 1 tablet (20 mg total) by mouth daily. 30 tablet 0   loratadine (  CLARITIN) 10 MG tablet Take 10 mg by mouth daily.     magnesium  oxide (MAG-OX) 400 (241.3 Mg) MG tablet Take 400 mg by mouth daily.      meloxicam  (MOBIC ) 7.5 MG tablet Take 7.5 mg by mouth as needed for pain.     metoprolol  succinate (TOPROL -XL) 100 MG 24 hr tablet Take 1 tablet (100 mg total) by mouth daily. 30 tablet 6   pantoprazole  (PROTONIX ) 40 MG tablet Take 40 mg by mouth daily.     SYNTHROID  112 MCG tablet Take 112 mcg by mouth daily before  breakfast.      tiZANidine (ZANAFLEX) 4 MG capsule Take by mouth.     traZODone  (DESYREL ) 50 MG tablet Take 50 mg by mouth at bedtime.     warfarin (COUMADIN ) 5 MG tablet Take 1 tablet (5 mg total) by mouth one time only at 4 PM for 5 days. 5 tablet 0   No facility-administered medications prior to visit.    PAST MEDICAL HISTORY: Past Medical History:  Diagnosis Date   Cancer [facility])    breast    Coronary artery disease due to lipid rich plaque 04/13/2016   Diabetes mellitus without complication (HCC)    Diverticulosis of intestine without bleeding 12/13/2016   History of right mastectomy 11/15/2014   Hypothyroidism    Overactive bladder    Paget's disease of the bone    Paroxysmal atrial fibrillation (HCC) 06/19/2014   Restless leg syndrome     PAST SURGICAL HISTORY: Past Surgical History:  Procedure Laterality Date   ABDOMINAL HYSTERECTOMY  1999   APPENDECTOMY     as a child   CATARACT EXTRACTION Bilateral Fall 2013   Dr. Neil Balls   CATARACT EXTRACTION, BILATERAL     EYE SURGERY Bilateral Fall 2013   Cat Sx - Dr. Neil Balls   MASTECTOMY Right    RIGHT/LEFT HEART CATH AND CORONARY ANGIOGRAPHY N/A 07/28/2023   Procedure: RIGHT/LEFT HEART CATH AND CORONARY ANGIOGRAPHY;  Surgeon: Arnoldo Lapping, MD;  Location: [facility] INVASIVE CV LAB;  Service: Cardiovascular;  Laterality: N/A;    FAMILY HISTORY: Family History  Problem Relation Age of Onset   Breast cancer Mother    Colon cancer Father    Kidney cancer Father    Bladder Cancer Sister    Suicidality Brother    Heart attack Maternal Grandmother    Breast cancer Sister    Stomach cancer Sister    Ovarian cancer Sister    Breast cancer Sister    Breast cancer Sister    Diabetes Sister    Other Brother        parathyroid disorder; Engelmann's disease   Angelman syndrome Son    Graves' disease Daughter     SOCIAL HISTORY: Social History   Socioeconomic History   Marital status: Widowed    Spouse name: Not on file   Number of  children: 4   Years of education: Not on file   Highest education level: GED or equivalent  Occupational History   Occupation: Retired  Tobacco Use   Smoking status: Former   Smokeless tobacco: Never  Advertising account planner   Vaping status: Never Used  Substance and Sexual Activity   Alcohol use: No   Drug use: No   Sexual activity: Not Currently  Other Topics Concern   Not on file  Social History Narrative   Not on file   Social Drivers of Health   Financial Resource Strain: Not on file  Food Insecurity: No Food Insecurity (  07/24/2023)   Hunger Vital Sign    Worried About Running Out of Food in the Last Year: Never true    Ran Out of Food in the Last Year: Never true  Transportation Needs: No Transportation Needs (07/24/2023)   PRAPARE - Administrator, Civil Service (Medical): No    Lack of Transportation (Non-Medical): No  Physical Activity: Not on file  Stress: Not on file  Social Connections: Moderately Integrated (07/24/2023)   Social Connection and Isolation Panel [NHANES]    Frequency of Communication with Friends and Family: More than three times a week    Frequency of Social Gatherings with Friends and Family: More than three times a week    Attends Religious Services: More than 4 times per year    Active Member of [employer] or Organizations: Yes    Attends Banker Meetings: More than 4 times per year    Marital Status: Widowed  Intimate Partner Violence: Not At Risk (07/24/2023)   Humiliation, Afraid, Rape, and Kick questionnaire    Fear of Current or Ex-Partner: No    Emotionally Abused: No    Physically Abused: No    Sexually Abused: No    PHYSICAL EXAM  GENERAL EXAM/CONSTITUTIONAL: Vitals:  Vitals:   10/06/23 1147  BP: 133/69  Pulse: 85  Weight: 184 lb (83.5 kg)  Height: 5\' 5"  (1.651 m)   Body mass index is 30.62 kg/m. Wt Readings from Last 3 Encounters:  10/06/23 184 lb (83.5 kg)  08/11/23 182 lb 12.8 oz (82.9 kg)  07/28/23 176 lb 1.6  oz (79.9 kg)   Patient is in no distress; well developed, nourished and groomed; neck is supple  MUSCULOSKELETAL: Gait, strength, tone, movements noted in Neurologic exam below  NEUROLOGIC: MENTAL STATUS:      No data to display         awake, alert, oriented to person, place and time recent and remote memory intact normal attention and concentration language fluent, comprehension intact, naming intact fund of knowledge appropriate  CRANIAL NERVE:  2nd, 3rd, 4th, 6th - Visual fields full to confrontation, extraocular muscles intact, no nystagmus 5th - facial sensation symmetric 7th - facial strength symmetric 8th - hearing intact 9th - palate elevates symmetrically, uvula midline 11th - shoulder shrug symmetric 12th - tongue protrusion midline  MOTOR:  normal bulk and tone, full strength in the BUE, BLE  SENSORY:  normal and symmetric to light touch  COORDINATION:  finger-nose-finger, fine finger movements normal  GAIT/STATION:  Veering to the right with ambulation, unable to tandem     DIAGNOSTIC DATA (LABS, IMAGING, TESTING) - I reviewed patient records, labs, notes, testing and imaging myself where available.  Lab Results  Component Value Date   WBC 5.4 07/29/2023   HGB 11.3 (L) 07/29/2023   HCT 37.3 07/29/2023   MCV 79.2 (L) 07/29/2023   PLT 398 07/29/2023      Component Value Date/Time   NA 137 07/29/2023 0609   NA 137 01/25/2020 1023   K 4.0 07/29/2023 0609   CL 102 07/29/2023 0609   CO2 23 07/29/2023 0609   GLUCOSE 101 (H) 07/29/2023 0609   BUN 20 07/29/2023 0609   BUN 14 01/25/2020 1023   CREATININE 0.89 07/29/2023 0609   CALCIUM  9.5 07/29/2023 0609   PROT 7.3 06/06/2020 0347   PROT 6.9 01/25/2020 1023   ALBUMIN 3.6 06/06/2020 0347   ALBUMIN 4.0 01/25/2020 1023   AST 32 06/06/2020 0347   ALT 24  06/06/2020 0347   ALKPHOS 86 06/06/2020 0347   BILITOT 0.6 06/06/2020 0347   BILITOT <0.2 01/25/2020 1023   GFRNONAA >60 07/29/2023 0609    GFRAA 86 01/25/2020 1023   Lab Results  Component Value Date   CHOL 136 07/25/2023   HDL 31 (L) 07/25/2023   LDLCALC 84 07/25/2023   TRIG 106 07/25/2023   CHOLHDL 4.4 07/25/2023   Lab Results  Component Value Date   HGBA1C 5.8 (H) 07/25/2023   No results found for: "VITAMINB12" Lab Results  Component Value Date   TSH 2.899 07/25/2023    MRI Brain 07/24/2023 1.  No evidence of an acute intracranial abnormality. 2. Mild-to-moderate chronic small vessel ischemic changes within the cerebral white matter, similar to the prior brain MRI of 08/29/2019.  MRA Head 07/25/2023 Normal intracranial MR angiography of the large and medium size vessels.    ASSESSMENT AND PLAN  78 y.o. year old female with history of atrial fibrillation, hypertension, diabetes who is presenting with complaints of abnormal gait described as leaning to the right with ambulation, no other complaints. Her MRI negative for acute abnormality. On exam, she was noted to veer to the right, unable to tandem. Plan will be for patient to be referred to physical therapy for gait training. Advised her to follow up with PCP and to return as needed    1. Balance disorder   2. Abnormal gait      Patient Instructions  Continue current medications  Referral to physical therapy for gait training  Continue to follow up with your doctors  Return as needed   Orders Placed This Encounter  Procedures   Ambulatory referral to Physical Therapy   No orders of the defined types were placed in this encounter.   Return if symptoms worsen or fail to improve.  I have spent a total of 45 minutes dedicated to this patient today, preparing to see patient, performing a medically appropriate examination and evaluation, ordering tests and/or medications and procedures, and counseling and educating the patient/family/caregiver; independently interpreting result and communicating results to the family/patient/caregiver; and documenting  clinical information in the electronic medical record.   Cassandra Cleveland, MD 10/06/2023, 7:14 PM  [facility] [address] [place], [place] [address] [phone]

## 2023-10-06 NOTE — Patient Instructions (Signed)
 Continue current medications  Referral to physical therapy for gait training  Continue to follow up with your doctors  Return as needed

## 2023-10-10 ENCOUNTER — Telehealth: Payer: Self-pay | Admitting: Neurology

## 2023-10-10 NOTE — Telephone Encounter (Signed)
 PT referral sent to [facility] in [place], phone #: [phone] fax #: [phone].

## 2023-10-26 ENCOUNTER — Ambulatory Visit: Attending: Cardiology | Admitting: *Deleted

## 2023-10-26 DIAGNOSIS — I480 Paroxysmal atrial fibrillation: Secondary | ICD-10-CM | POA: Diagnosis present

## 2023-10-26 DIAGNOSIS — Z5181 Encounter for therapeutic drug level monitoring: Secondary | ICD-10-CM | POA: Diagnosis present

## 2023-10-26 LAB — POCT INR: INR: 2.8 (ref 2.0–3.0)

## 2023-10-26 NOTE — Patient Instructions (Signed)
 Continue warfarin 1 tablet daily except 1/2 tablet on Mondays, Wednesdays and Fridays. Continue to eat 2 serving of greens/week.  Recheck in 4 weeks

## 2023-11-30 ENCOUNTER — Ambulatory Visit: Attending: Cardiology | Admitting: *Deleted

## 2023-11-30 DIAGNOSIS — I480 Paroxysmal atrial fibrillation: Secondary | ICD-10-CM | POA: Insufficient documentation

## 2023-11-30 DIAGNOSIS — Z5181 Encounter for therapeutic drug level monitoring: Secondary | ICD-10-CM | POA: Diagnosis present

## 2023-11-30 LAB — POCT INR: INR: 2.9 (ref 2.0–3.0)

## 2023-11-30 NOTE — Progress Notes (Signed)
Please see anticoagulation encounter.

## 2023-11-30 NOTE — Patient Instructions (Signed)
 Continue warfarin 1 tablet daily except 1/2 tablet on Mondays, Wednesdays and Fridays. Continue to eat 2 serving of greens/week.  Recheck in 5 weeks

## 2023-12-13 NOTE — Telephone Encounter (Signed)
 Discharge summary signed and faxed to [facility] in [place], phone #: [phone] fax #: [phone]

## 2024-01-04 ENCOUNTER — Ambulatory Visit: Attending: Cardiology | Admitting: *Deleted

## 2024-01-04 DIAGNOSIS — I480 Paroxysmal atrial fibrillation: Secondary | ICD-10-CM | POA: Insufficient documentation

## 2024-01-04 DIAGNOSIS — Z5181 Encounter for therapeutic drug level monitoring: Secondary | ICD-10-CM | POA: Diagnosis present

## 2024-01-04 LAB — POCT INR: INR: 2.7 (ref 2.0–3.0)

## 2024-01-04 NOTE — Patient Instructions (Signed)
 Continue warfarin 1 tablet daily except 1/2 tablet on Mondays, Wednesdays and Fridays. Continue to eat 2 serving of greens/week.  Recheck in 6 weeks

## 2024-01-04 NOTE — Progress Notes (Signed)
 INR 2.7  Please see anticoagulation encounter

## 2024-01-27 ENCOUNTER — Encounter: Payer: Self-pay | Admitting: Radiology

## 2024-02-01 ENCOUNTER — Ambulatory Visit: Attending: Cardiology | Admitting: *Deleted

## 2024-02-01 DIAGNOSIS — I480 Paroxysmal atrial fibrillation: Secondary | ICD-10-CM | POA: Diagnosis present

## 2024-02-01 DIAGNOSIS — Z5181 Encounter for therapeutic drug level monitoring: Secondary | ICD-10-CM | POA: Insufficient documentation

## 2024-02-01 LAB — POCT INR: INR: 3.2 — AB (ref 2.0–3.0)

## 2024-02-01 NOTE — Patient Instructions (Signed)
 Hold warfarin tomorrow then resume 1 tablet daily except 1/2 tablet on Mondays, Wednesdays and Fridays. Continue to eat 2 serving of greens/week.  Recheck in 6 weeks

## 2024-02-01 NOTE — Progress Notes (Signed)
 INR 3.2 Please see anticoagulation encounter

## 2024-02-15 ENCOUNTER — Encounter

## 2024-02-23 ENCOUNTER — Other Ambulatory Visit: Payer: Self-pay | Admitting: Cardiology

## 2024-03-05 ENCOUNTER — Other Ambulatory Visit: Payer: Self-pay | Admitting: Pharmacist

## 2024-03-05 DIAGNOSIS — I480 Paroxysmal atrial fibrillation: Secondary | ICD-10-CM

## 2024-03-05 MED ORDER — WARFARIN SODIUM 5 MG PO TABS: ORAL_TABLET | ORAL | 0 refills | Status: DC

## 2024-03-14 ENCOUNTER — Ambulatory Visit: Attending: Cardiology | Admitting: *Deleted

## 2024-03-14 DIAGNOSIS — Z5181 Encounter for therapeutic drug level monitoring: Secondary | ICD-10-CM | POA: Insufficient documentation

## 2024-03-14 DIAGNOSIS — I480 Paroxysmal atrial fibrillation: Secondary | ICD-10-CM | POA: Insufficient documentation

## 2024-03-14 LAB — POCT INR: INR: 3.7 — AB (ref 2.0–3.0)

## 2024-03-14 NOTE — Patient Instructions (Signed)
 Hold warfarin tomorrow then decrease dose to 1/2 tablet daily except 1 tablet on Sundays, Tuesdays and Thursdays. Continue to eat 2 serving of greens/week.  Recheck in 2 weeks

## 2024-03-14 NOTE — Progress Notes (Signed)
 INR 3.7 Please see anticoagulation encounter

## 2024-03-28 ENCOUNTER — Ambulatory Visit: Attending: Cardiology | Admitting: *Deleted

## 2024-03-28 DIAGNOSIS — Z5181 Encounter for therapeutic drug level monitoring: Secondary | ICD-10-CM | POA: Diagnosis present

## 2024-03-28 DIAGNOSIS — I480 Paroxysmal atrial fibrillation: Secondary | ICD-10-CM | POA: Diagnosis present

## 2024-03-28 LAB — POCT INR: INR: 2.5 (ref 2.0–3.0)

## 2024-03-28 NOTE — Patient Instructions (Signed)
 Continue warfarin 1/2 tablet daily except 1 tablet on Sundays, Tuesdays and Thursdays. Continue to eat 2 serving of greens/week.  Recheck in 4 weeks

## 2024-03-28 NOTE — Progress Notes (Signed)
 INR 2.5. Please see anticoagulation encounter

## 2024-03-29 ENCOUNTER — Ambulatory Visit: Attending: Cardiology | Admitting: Cardiology

## 2024-03-29 ENCOUNTER — Encounter: Payer: Self-pay | Admitting: Cardiology

## 2024-03-29 VITALS — BP 160/90 | HR 62 | Ht 65.0 in | Wt 187.2 lb

## 2024-03-29 DIAGNOSIS — I5022 Chronic systolic (congestive) heart failure: Secondary | ICD-10-CM | POA: Diagnosis present

## 2024-03-29 DIAGNOSIS — I480 Paroxysmal atrial fibrillation: Secondary | ICD-10-CM | POA: Insufficient documentation

## 2024-03-29 DIAGNOSIS — I10 Essential (primary) hypertension: Secondary | ICD-10-CM | POA: Diagnosis present

## 2024-03-29 DIAGNOSIS — D6869 Other thrombophilia: Secondary | ICD-10-CM | POA: Diagnosis present

## 2024-03-29 NOTE — Progress Notes (Signed)
 Clinical Summary Desiree Bowers is a 78 y.o.female seen today for follow up of the following medical problems.      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   -07/2023  Echo: LVEF 45-50%, grade III dd, mild D shaped septum, mild RV dysfunction, mod RVE, mod pulm HTN, mild LAE, mod MR, mod to severe TR.   - 07/2023 cath: normal coronaries. Mean PA 18, PCWP 4, CI 2.9. Weight on that day was 177 lbs.      - reports cough on losartan  per her report - aldactone  caused breast tenderness - coreg  causes cough, tolerates metoprolol   - no SOB/DOE. No LE edema - compliant with meds     2. PAF -08/2019 monitor showed episodes of PAF  DOACs were too expensive -- during 07/2023 admission pharmacy ran the two meds. Eliquis  copay is $556.87, Xarelto  copay is $555.29 , copays so high due to a $590.00 deductible    - denies any palpitations - no bleeding on coumadin .      3. Vision changes - seen in ER 08/29/19 with acute vision change - MRI without acute findings - seen by optho, diagnosed with retinal ischemia.  - may have been related to her PAF which just diagnosed by monitor.      4. Valvular heart disease -07/2023  Echo: LVEF 45-50%, grade III dd, mild D shaped septum, mild RV dysfunction, mod RVE, mod pulm HTN, mild LAE, mod MR, mod to severe TR.    5. HTN  - had some leg swelling on amlodopine per her report - reports cough on losartan  per her report - reproted coughto on coreg  - aldactone  caused breast tenderness - compliant with meds   - compliant with meds,reports pcp stopped chlorthalidone  she is not sure the reason   - home bp's        5. LE edema - takes prn lasix  and swelling is controlled   6. TIA like symptoms - admission 07/2023 - from neuro note thought possible cardioembolic event given subthereapeutic INR  7. HLD - not in favor of statin. Atorva listed as allergy she does not recall ever  taking.  ASCVD risk 37%.  Past Medical History:  Diagnosis Date   Cancer [facility])    breast    Coronary artery disease due to lipid rich plaque 04/13/2016   Diabetes mellitus without complication (HCC)    Diverticulosis of intestine without bleeding 12/13/2016   History of right mastectomy 11/15/2014   Hypothyroidism    Overactive bladder    Paget's disease of the bone    Paroxysmal atrial fibrillation (HCC) 06/19/2014   Restless leg syndrome      Allergies  Allergen Reactions   Mirapex  [Pramipexole  Dihydrochloride] Itching and Swelling   Penicillins Rash   Atorvastatin  Other (See Comments)   Fosamax [Alendronate] Other (See Comments)   Gabapentin Other (See Comments)    Couldn't move legs during the night after taking.   Levothyroxine  Itching   Symbicort  [Budesonide -Formoterol  Fumarate] Itching     Current Outpatient Medications  Medication Sig Dispense Refill   amLODipine (NORVASC) 2.5 MG tablet Take 2.5 mg by mouth daily.     calcium  carbonate (OSCAL) 1500 (600 Ca) MG TABS tablet Take 2 tablets by mouth daily.     furosemide  (LASIX ) 20 MG tablet Take 1 tablet (20 mg total) by mouth daily. (Patient taking differently: Take 20 mg by mouth daily as needed.) 30 tablet  0   magnesium  oxide (MAG-OX) 400 (241.3 Mg) MG tablet Take 400 mg by mouth daily.      meloxicam  (MOBIC ) 7.5 MG tablet Take 7.5 mg by mouth as needed for pain.     metoprolol  succinate (TOPROL -XL) 100 MG 24 hr tablet TAKE 1 TABLET BY MOUTH EVERY DAY 90 tablet 1   pantoprazole  (PROTONIX ) 40 MG tablet Take 40 mg by mouth daily.     SYNTHROID  112 MCG tablet Take 112 mcg by mouth daily before breakfast.      tiZANidine (ZANAFLEX) 4 MG tablet Take 4 mg by mouth every 6 (six) hours as needed.     warfarin (COUMADIN ) 5 MG tablet Take 1/2 to 1 tablet by mouth once daily or as directed by Coumadin  Clinic 100 tablet 0   loratadine (CLARITIN) 10 MG tablet Take 10 mg by mouth daily.     No current facility-administered  medications for this visit.     Past Surgical History:  Procedure Laterality Date   ABDOMINAL HYSTERECTOMY  1999   APPENDECTOMY     as a child   CATARACT EXTRACTION Bilateral Fall 2013   Dr. Carrie   CATARACT EXTRACTION, BILATERAL     EYE SURGERY Bilateral Fall 2013   Cat Sx - Dr. Carrie   MASTECTOMY Right    RIGHT/LEFT HEART CATH AND CORONARY ANGIOGRAPHY N/A 07/28/2023   Procedure: RIGHT/LEFT HEART CATH AND CORONARY ANGIOGRAPHY;  Surgeon: Wonda Sharper, MD;  Location: [facility] INVASIVE CV LAB;  Service: Cardiovascular;  Laterality: N/A;     Allergies  Allergen Reactions   Mirapex  [Pramipexole  Dihydrochloride] Itching and Swelling   Penicillins Rash   Atorvastatin  Other (See Comments)   Fosamax [Alendronate] Other (See Comments)   Gabapentin Other (See Comments)    Couldn't move legs during the night after taking.   Levothyroxine  Itching   Symbicort  [Budesonide -Formoterol  Fumarate] Itching      Family History  Problem Relation Age of Onset   Breast cancer Mother    Colon cancer Father    Kidney cancer Father    Bladder Cancer Sister    Suicidality Brother    Heart attack Maternal Grandmother    Breast cancer Sister    Stomach cancer Sister    Ovarian cancer Sister    Breast cancer Sister    Breast cancer Sister    Diabetes Sister    Other Brother        parathyroid disorder; Engelmann's disease   Angelman syndrome Son    Yvone' disease Daughter      Social History Desiree Bowers reports that she has quit smoking. She has never used smokeless tobacco. Desiree Bowers reports no history of alcohol use.    Physical Examination Today's Vitals   03/29/24 1357 03/29/24 1429  BP: (!) 154/84 (!) 160/90  Pulse: 62   SpO2: 94%   Weight: 187 lb 3.2 oz (84.9 kg)   Height: 5' 5 (1.651 m)    Body mass index is 31.15 kg/m.  Gen: resting comfortably, no acute distress HEENT: no scleral icterus, pupils equal round and reactive, no palptable cervical adenopathy,  CV: RRR, no mrg,  no jvd Resp: Clear to auscultation bilaterally GI: abdomen is soft, non-tender, non-distended, normal bowel sounds, no hepatosplenomegaly MSK: extremities are warm, no edema.  Skin: warm, no rash Neuro:  no focal deficits Psych: appropriate affect   Diagnostic Studies 08/2015 cath [facility] FINDINGS:  Coronary Angiography 1. Left Main - Normal 2. Left anterior descending artery - the LAD gives off a  small first diagonal and a large second diagonal, normal. 3. Diagonals - Normal 4. Left Circumflex - the circumflex gives off a large OM1, small OM 2, and moderately large OM 3, normal. 5. Obtuse Marginals - Normal 6. Right Coronary Artery - the RCA gives off the PDA and small PLV branches, normal.  There is a conus Denyse Fillion that originates on the aorta, dual lumen with the RCA. 7. Posterior Descending Artery - Normal  Additional comments on angiography: Right Dominance   1.  Normal coronary arteries. 2.  LVEDP 20 3.  RRA access.  There was spasm of the radial artery near the level of the elbow so access was changed from RRA to right femoral artery.  RFA was closed with Perclose vascular suture device. 4. No Complications.   RECOMMENDATIONS:  1. Continue workup for etiology of current symptoms. 2. Followup with Dr. Sheffield for with her PCP. 3. Continue risk factor modification   10/2019 echo IMPRESSIONS     1. Left ventricular ejection fraction, by estimation, is 55 to 60%. The  left ventricle has normal function. The left ventricle has no regional  wall motion abnormalities. There is mild left ventricular hypertrophy of  the basal-septal segment. Left  ventricular diastolic parameters are consistent with Grade I diastolic  dysfunction (impaired relaxation).   2. Right ventricular systolic function is low normal. The right  ventricular size is normal. There is normal pulmonary artery systolic  pressure.   3. The mitral valve is grossly normal. Mild to moderate mitral valve   regurgitation.   4. Tricuspid valve regurgitation is moderate.   5. The aortic valve is tricuspid. Aortic valve regurgitation is trivial.  No aortic stenosis is present.   6. The inferior vena cava is normal in size with greater than 50%  respiratory variability, suggesting right atrial pressure of 3 mmHg.      Jan 2024 echo   1. Left ventricular ejection fraction, by estimation, is 60 to 65%. The  left ventricle has normal function. The left ventricle has no regional  wall motion abnormalities. Left ventricular diastolic parameters are  consistent with Grade I diastolic  dysfunction (impaired relaxation). The average left ventricular global  longitudinal strain is -22.0 %. The global longitudinal strain is normal.   2. Right ventricular systolic function is normal. The right ventricular  size is normal.   3. The mitral valve is abnormal. Mild mitral valve regurgitation. No  evidence of mitral stenosis.   4. The tricuspid valve is abnormal.   5. The aortic valve has an indeterminant number of cusps. There is mild  calcification of the aortic valve. There is mild thickening of the aortic  valve. Aortic valve regurgitation is not visualized. No aortic stenosis is  present.   6. The inferior vena cava is normal in size with greater than 50%  respiratory variability, suggesting right atrial pressure of 3 mmHg.         Assessment and Plan  1.PAF/acquired thrombophilia - eliquis  and xarelto  were too expensive, she is on coumadin  - denies any symptoms   2. Chronic HFmrEF - no symptoms, continue curren tmds   3. HTN - bp above goal, she will submit bp log in next few weeks - in neccesary would add hydralazine  25mg  bid, see multiple medication side effects listed above.         Dorn PHEBE Ross, M.D

## 2024-03-29 NOTE — Patient Instructions (Signed)
 Medication Instructions:   Continue all current medications.   Labwork:  none  Testing/Procedures:  none  Follow-Up:  6 months   Any Other Special Instructions Will Be Listed Below (If Applicable).  BP log - check 3 x week - bring to visit on 04/25/24  If you need a refill on your cardiac medications before your next appointment, please call your pharmacy.

## 2024-04-03 ENCOUNTER — Telehealth: Payer: Self-pay | Admitting: *Deleted

## 2024-04-03 DIAGNOSIS — I480 Paroxysmal atrial fibrillation: Secondary | ICD-10-CM

## 2024-04-03 MED ORDER — WARFARIN SODIUM 5 MG PO TABS: ORAL_TABLET | ORAL | 1 refills | Status: AC

## 2024-04-03 NOTE — Telephone Encounter (Signed)
 Pt called stating [facility] can not get her warfarin 5mg  tablet.  Out of stock.  New Rx sent to [facility].

## 2024-04-09 ENCOUNTER — Encounter: Payer: Self-pay | Admitting: Radiology

## 2024-04-25 ENCOUNTER — Ambulatory Visit: Attending: Cardiology | Admitting: *Deleted

## 2024-04-25 ENCOUNTER — Encounter: Payer: Self-pay | Admitting: Cardiology

## 2024-04-25 DIAGNOSIS — I480 Paroxysmal atrial fibrillation: Secondary | ICD-10-CM | POA: Insufficient documentation

## 2024-04-25 DIAGNOSIS — Z5181 Encounter for therapeutic drug level monitoring: Secondary | ICD-10-CM | POA: Insufficient documentation

## 2024-04-25 LAB — POCT INR: INR: 3 (ref 2.0–3.0)

## 2024-04-25 NOTE — Progress Notes (Signed)
 INR 3.0; Please see anticoagulation encounter

## 2024-04-25 NOTE — Patient Instructions (Signed)
 Continue warfarin 1/2 tablet daily except 1 tablet on Sundays, Tuesdays and Thursdays. Continue to eat 2 serving of greens/week.  Recheck in 4 weeks

## 2024-05-23 ENCOUNTER — Ambulatory Visit: Attending: Cardiology

## 2024-05-23 DIAGNOSIS — Z5181 Encounter for therapeutic drug level monitoring: Secondary | ICD-10-CM | POA: Insufficient documentation

## 2024-05-23 DIAGNOSIS — I480 Paroxysmal atrial fibrillation: Secondary | ICD-10-CM | POA: Diagnosis present

## 2024-05-23 LAB — POCT INR: INR: 2.1 (ref 2.0–3.0)

## 2024-05-23 NOTE — Patient Instructions (Signed)
 Continue warfarin 1/2 tablet daily except 1 tablet on Sundays, Tuesdays and Thursdays. Continue to eat 2 serving of greens/week.  Recheck in 6 weeks

## 2024-05-23 NOTE — Progress Notes (Signed)
 INR 2.1; Please see anticoagulation encounter

## 2024-06-26 ENCOUNTER — Ambulatory Visit: Payer: Self-pay | Admitting: Cardiology

## 2024-06-26 MED ORDER — HYDRALAZINE HCL 25 MG PO TABS: 25.0000 mg | ORAL_TABLET | Freq: Two times a day (BID) | ORAL | 3 refills | Status: AC

## 2024-06-26 NOTE — Telephone Encounter (Signed)
-----   Message from Dorn Ross, MD sent at 06/26/2024  3:20 PM EST ----- BP running too high from bp log and other doctors visits, can she start hydralazine  25mg  bid  J BrancH MD

## 2024-06-26 NOTE — Telephone Encounter (Signed)
 Patient agrees and will start hydralazine , requested it be sent to [facility]

## 2024-07-04 ENCOUNTER — Ambulatory Visit: Payer: Self-pay | Admitting: Cardiology

## 2024-07-04 ENCOUNTER — Encounter: Payer: Self-pay | Admitting: Cardiology

## 2024-07-04 ENCOUNTER — Ambulatory Visit: Attending: Cardiology | Admitting: *Deleted

## 2024-07-04 DIAGNOSIS — I480 Paroxysmal atrial fibrillation: Secondary | ICD-10-CM | POA: Insufficient documentation

## 2024-07-04 DIAGNOSIS — Z5181 Encounter for therapeutic drug level monitoring: Secondary | ICD-10-CM | POA: Insufficient documentation

## 2024-07-04 LAB — POCT INR: INR: 2.2 (ref 2.0–3.0)

## 2024-07-04 NOTE — Progress Notes (Signed)
 INR 2.2

## 2024-07-04 NOTE — Patient Instructions (Signed)
 Continue warfarin 1/2 tablet daily except 1 tablet on Sundays, Tuesdays and Thursdays. Continue to eat 2 serving of greens/week.  Recheck in 6 weeks

## 2024-08-15 ENCOUNTER — Ambulatory Visit
